# Patient Record
Sex: Male | Born: 1935
Health system: Southern US, Community
[De-identification: ages and names within clinical notes are randomized; demographics above are authoritative.]

## PROBLEM LIST (undated history)

## (undated) DIAGNOSIS — N401 Enlarged prostate with lower urinary tract symptoms: Secondary | ICD-10-CM

## (undated) DIAGNOSIS — F329 Major depressive disorder, single episode, unspecified: Secondary | ICD-10-CM

## (undated) DIAGNOSIS — R609 Edema, unspecified: Secondary | ICD-10-CM

## (undated) DIAGNOSIS — M40209 Unspecified kyphosis, site unspecified: Secondary | ICD-10-CM

## (undated) DIAGNOSIS — E119 Type 2 diabetes mellitus without complications: Secondary | ICD-10-CM

## (undated) DIAGNOSIS — E785 Hyperlipidemia, unspecified: Secondary | ICD-10-CM

## (undated) DIAGNOSIS — C439 Malignant melanoma of skin, unspecified: Secondary | ICD-10-CM

## (undated) DIAGNOSIS — I10 Essential (primary) hypertension: Secondary | ICD-10-CM

## (undated) DIAGNOSIS — E1169 Type 2 diabetes mellitus with other specified complication: Secondary | ICD-10-CM

## (undated) DIAGNOSIS — N138 Other obstructive and reflux uropathy: Secondary | ICD-10-CM

## (undated) DIAGNOSIS — F32A Depression, unspecified: Secondary | ICD-10-CM

## (undated) DIAGNOSIS — J984 Other disorders of lung: Secondary | ICD-10-CM

## (undated) HISTORY — DX: Type 2 diabetes mellitus with other specified complication: E11.69

## (undated) HISTORY — DX: Type 2 diabetes mellitus with other specified complication: E78.5

## (undated) HISTORY — PX: HERNIA REPAIR: SHX51

## (undated) HISTORY — DX: Malignant melanoma of skin, unspecified: C43.9

## (undated) HISTORY — DX: Edema, unspecified: R60.9

## (undated) HISTORY — DX: Benign prostatic hyperplasia with lower urinary tract symptoms: N13.8

## (undated) HISTORY — DX: Unspecified kyphosis, site unspecified: M40.209

## (undated) HISTORY — DX: Other disorders of lung: J98.4

## (undated) HISTORY — PX: MEDIAL PARTIAL KNEE REPLACEMENT: SHX5965

## (undated) HISTORY — DX: Benign prostatic hyperplasia with lower urinary tract symptoms: N40.1

---

## 1997-09-08 ENCOUNTER — Ambulatory Visit (HOSPITAL_COMMUNITY): Admission: RE | Admit: 1997-09-08 | Discharge: 1997-09-08 | Payer: Self-pay | Admitting: *Deleted

## 1997-09-12 ENCOUNTER — Ambulatory Visit: Admission: RE | Admit: 1997-09-12 | Discharge: 1997-09-12 | Payer: Self-pay | Admitting: *Deleted

## 1997-09-27 ENCOUNTER — Ambulatory Visit (HOSPITAL_COMMUNITY): Admission: RE | Admit: 1997-09-27 | Discharge: 1997-09-27 | Payer: Self-pay | Admitting: *Deleted

## 1998-01-17 ENCOUNTER — Ambulatory Visit (HOSPITAL_COMMUNITY): Admission: RE | Admit: 1998-01-17 | Discharge: 1998-01-17 | Payer: Self-pay | Admitting: *Deleted

## 1998-01-17 ENCOUNTER — Encounter: Payer: Self-pay | Admitting: *Deleted

## 1999-01-08 ENCOUNTER — Encounter: Payer: Self-pay | Admitting: Family Medicine

## 1999-01-08 ENCOUNTER — Ambulatory Visit (HOSPITAL_COMMUNITY): Admission: RE | Admit: 1999-01-08 | Discharge: 1999-01-08 | Payer: Self-pay | Admitting: Family Medicine

## 2003-11-14 ENCOUNTER — Encounter (INDEPENDENT_AMBULATORY_CARE_PROVIDER_SITE_OTHER): Payer: Self-pay | Admitting: *Deleted

## 2003-11-14 ENCOUNTER — Ambulatory Visit (HOSPITAL_COMMUNITY): Admission: RE | Admit: 2003-11-14 | Discharge: 2003-11-14 | Payer: Self-pay | Admitting: Gastroenterology

## 2006-09-15 LAB — HM COLONOSCOPY

## 2007-09-23 ENCOUNTER — Emergency Department (HOSPITAL_COMMUNITY): Admission: EM | Admit: 2007-09-23 | Discharge: 2007-09-23 | Payer: Self-pay | Admitting: Emergency Medicine

## 2007-09-23 ENCOUNTER — Emergency Department (HOSPITAL_COMMUNITY): Admission: EM | Admit: 2007-09-23 | Discharge: 2007-09-23 | Payer: Self-pay | Admitting: Family Medicine

## 2007-09-24 ENCOUNTER — Emergency Department (HOSPITAL_COMMUNITY): Admission: EM | Admit: 2007-09-24 | Discharge: 2007-09-24 | Payer: Self-pay | Admitting: Emergency Medicine

## 2008-06-22 DEATH — deceased

## 2010-08-09 NOTE — Op Note (Signed)
NAME:  Brian [name], Brian [name]                          ACCOUNT NO.:  [id number]   MEDICAL RECORD NO.:  [id number]                   PATIENT TYPE:  AMB   LOCATION:  ENDO                                 FACILITY:  [facility]   PHYSICIAN:  James L. Malon Kindle., M.D.          DATE OF BIRTH:  [birth year]   DATE OF PROCEDURE:  11/14/2003  DATE OF DISCHARGE:                                 OPERATIVE REPORT   PROCEDURE:  Colonoscopy and polypectomy.   MEDICATIONS:  Fentanyl 75 mcg, Versed 7 mg IV.   ENDOSCOPE:  Olympus adjustable pediatric colonoscope.   INDICATIONS:  A gentleman who has had previous colon polyps [place]  [place] city.  This is done as a repeat.  He is a bit overdue.   DESCRIPTION OF PROCEDURE:  The procedure had been explained to the patient  and consent obtained.  With the patient in the left lateral decubitus  position, the Olympus scope was inserted and advanced.  The prep was  excellent, and we were able to reach the cecum without difficulty.  The  ileocecal valve and appendiceal orifice were seen.  The scope was withdrawn  and the cecum, ascending colon, transverse colon were seen well and were  free of polyps.  In the descending colon 75-80 cm from the anal verge, a  0.75 semipedunculated polyp was encountered and was removed with a snare,  sucked through the scope.  There was no bleeding.  There were multiple large-  mouthed diverticula, but the colon was really widely patent.  Throughout the  descending and sigmoid colon, no other polyps were seen.  The rectum was  seen well and was free of polyps.  The scope was withdrawn.  The patient  tolerated the procedure well.   ASSESSMENT:  1. Descending colon polyp removed, 211.3.  2. Diverticulosis, 562.10.   PLAN:  Will recommend routine polypectomy instructions.  Will repeat  procedure in five years.                                               James L. Malon Kindle., M.D.    Brian [name]  D:  11/14/2003  T:  11/14/2003   Job:  [id number]   cc:   Meredith Staggers, M.D.  [address]  [place]  [place] [address]  Fax: [phone]   Darnelle Going, M.D.  [facility]  [place], [place]

## 2011-09-01 DIAGNOSIS — G629 Polyneuropathy, unspecified: Secondary | ICD-10-CM | POA: Insufficient documentation

## 2011-09-01 DIAGNOSIS — I10 Essential (primary) hypertension: Secondary | ICD-10-CM | POA: Insufficient documentation

## 2011-09-01 DIAGNOSIS — M40209 Unspecified kyphosis, site unspecified: Secondary | ICD-10-CM | POA: Insufficient documentation

## 2011-09-01 DIAGNOSIS — K635 Polyp of colon: Secondary | ICD-10-CM

## 2011-09-01 DIAGNOSIS — E119 Type 2 diabetes mellitus without complications: Secondary | ICD-10-CM

## 2011-09-01 DIAGNOSIS — M1990 Unspecified osteoarthritis, unspecified site: Secondary | ICD-10-CM | POA: Insufficient documentation

## 2011-09-01 HISTORY — DX: Type 2 diabetes mellitus without complications: E11.9

## 2011-09-01 HISTORY — DX: Essential (primary) hypertension: I10

## 2011-09-01 HISTORY — DX: Polyp of colon: K63.5

## 2012-02-02 DIAGNOSIS — N320 Bladder-neck obstruction: Secondary | ICD-10-CM

## 2012-02-02 HISTORY — DX: Bladder-neck obstruction: N32.0

## 2012-02-17 ENCOUNTER — Other Ambulatory Visit: Payer: Self-pay | Admitting: Gastroenterology

## 2012-11-22 DEATH — deceased

## 2013-03-24 HISTORY — PX: MEDIAL PARTIAL KNEE REPLACEMENT: SHX5965

## 2013-03-24 HISTORY — PX: OTHER SURGICAL HISTORY: SHX169

## 2014-11-10 DIAGNOSIS — E119 Type 2 diabetes mellitus without complications: Secondary | ICD-10-CM | POA: Insufficient documentation

## 2015-02-02 DIAGNOSIS — N138 Other obstructive and reflux uropathy: Secondary | ICD-10-CM | POA: Insufficient documentation

## 2015-02-02 DIAGNOSIS — N401 Enlarged prostate with lower urinary tract symptoms: Secondary | ICD-10-CM | POA: Insufficient documentation

## 2015-05-24 ENCOUNTER — Ambulatory Visit: Payer: Self-pay | Admitting: Physical Therapy

## 2015-06-04 ENCOUNTER — Ambulatory Visit: Payer: Medicare Other | Attending: Internal Medicine | Admitting: Physical Therapy

## 2015-06-04 DIAGNOSIS — M25562 Pain in left knee: Secondary | ICD-10-CM | POA: Insufficient documentation

## 2015-06-04 DIAGNOSIS — R2681 Unsteadiness on feet: Secondary | ICD-10-CM | POA: Diagnosis present

## 2015-06-04 DIAGNOSIS — M25561 Pain in right knee: Secondary | ICD-10-CM | POA: Diagnosis present

## 2015-06-04 DIAGNOSIS — M4806 Spinal stenosis, lumbar region: Secondary | ICD-10-CM | POA: Diagnosis present

## 2015-06-04 DIAGNOSIS — R293 Abnormal posture: Secondary | ICD-10-CM | POA: Diagnosis present

## 2015-06-04 DIAGNOSIS — M2560 Stiffness of unspecified joint, not elsewhere classified: Secondary | ICD-10-CM | POA: Insufficient documentation

## 2015-06-04 DIAGNOSIS — Z7409 Other reduced mobility: Secondary | ICD-10-CM | POA: Insufficient documentation

## 2015-06-04 DIAGNOSIS — M48061 Spinal stenosis, lumbar region without neurogenic claudication: Secondary | ICD-10-CM

## 2015-06-04 NOTE — Patient Instructions (Signed)
   Knee to Chest    Lying supine, bend involved knee to chest __3_ times, hold 30 sec Repeat with other leg. Do __2-3_ times per day.  Copyright  VHI. All rights reserved.     Lumbar Rotation (Non-Weight Bearing)    Feet on floor, slowly rock knees from side to side in small, pain-free range of motion. Allow lower back to rotate slightly. Repeat __10 __ times per set. Do __1-2__ sets per session. Do __2__ sessions per day.  http://orth.exer.us/160   Copyright  VHI. All rights reserved.     Hamstring: Towel Stretch (Supine)    Lie on back. Loop towel around left foot, hip and knee at 50-60 deg. Straighten knee and pull foot toward body. Hold _30 __ seconds. Relax. Repeat __2-3_ times. Do __2_ times a day. Repeat with other leg.    Copyright  VHI. All rights reserved.

## 2015-06-05 NOTE — Therapy (Signed)
[place], [place], [address] Phone: [phone]   Fax:  [phone]  Physical Therapy Evaluation  Patient Details  Name: Brian [name] MRN: [id number] Date of Birth: Jun 24, 1931 Referring Provider: Benjamine Sprague  Encounter Date: 06/04/2015      PT End of Session - 06/04/15 1929    Visit Number 1   Number of Visits 16   Date for PT Re-Evaluation 07/30/15   PT Start Time 1103   PT Stop Time 1146   PT Time Calculation (min) 43 min   Activity Tolerance Patient tolerated treatment well   Behavior During Therapy [facility] for tasks assessed/performed      No past medical history on file.  No past surgical history on file.  There were no vitals filed for this visit.  Visit Diagnosis:  Decreased functional mobility and endurance  Gait instability  Abnormal posture  Spinal stenosis of lumbar region  Joint stiffness of spine  Arthralgia of both lower legs      Subjective Assessment - 06/04/15 1109    Subjective Patient presents with decreased mobility since 02/01/15 when he fell.  He is a recent widower, has been less active, depressed.   He currently complains of bilateral LE symptoms (pain), weakness, denies sensory sx, some mild discomfort in low back and only with standing  He also c/o restricted breathing due to poor posture, becomes short of breath easily.   He has difficulty going from sit to stand without use of UE.  Has used cane and RW.    Patient is accompained by: Family member  Son   Pertinent History DM, HTN, Neuropathy, Unicompartmental knee replacement    Limitations Standing;Walking;House hold activities;Lifting   How long can you sit comfortably? as needed although >2 hours gets uncomfortable in the car    How long can you stand comfortably? <10 min    How long can you walk comfortably? can walk around the block with min pain, usually no more than 15 min    Diagnostic tests XR  [facility]   Patient Stated Goals feel better, get legs stronger   Currently in Pain? Yes   Pain Score 2    Pain Location Leg   Pain Orientation Right;Left;Posterior   Pain Descriptors / Indicators Aching   Pain Type Chronic pain   Pain Onset More than a month ago   Pain Frequency Intermittent   Aggravating Factors  standing, walking   Pain Relieving Factors sitting    Effect of Pain on Daily Activities not as active as he can be, walking limited.    Multiple Pain Sites No            [facility] PT Assessment - 06/04/15 1125    Assessment   Medical Diagnosis osteoarthrits    Referring Provider Benjamine Sprague   Onset Date/Surgical Date --  chronic   Hand Dominance Left   Prior Therapy No    Precautions   Precautions Fall   Restrictions   Weight Bearing Restrictions No   Balance Screen   Has the patient fallen in the past 6 months Yes   How many times? 1  3-4 yrs ago had fallen 3-4 times   Has the patient had a decrease in activity level because of a fear of falling?  Yes   Is the patient reluctant to leave their home because of a fear of falling?  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 shoulder, cervical spine flexed and rotated   AROM   Lumbar Flexion reaches to mid shin with supervision knees bent   Lumbar Extension 75-85%   Lumbar - Right Side Bend 50%   Lumbar - Left Side Bend 50%  pain on L   Lumbar - Right Rotation 75%   Lumbar - Left Rotation 75%   Strength   Right Hip Flexion 4/5   Right Hip Extension 4/5   Right Hip ABduction 4/5   Left Hip Flexion 4/5   Left Hip Extension 4/5   Left Hip ABduction 4/5   Right Knee Flexion 4/5   Right Knee Extension 5/5   Left Knee Flexion 4-/5   Left Knee Extension 5/5   Flexibility   Hamstrings 50 deg bilat, lacks 10-12 deg bilateral post knee    Palpation   Spinal mobility did not tolerate prone for assessment    Palpation comment no pain elicited   Special Tests   Lumbar Tests Straight Leg Raise   Straight Leg Raise   Findings Negative   Side  Left   Comment neg bilat.    Transfers   Transfers Sit to Stand   Sit to Stand 5: Supervision   Five time sit to stand comments  TBA   Comments per MD, patient could only stand x2 unassisted   Ambulation/Gait   Ambulation/Gait Yes   Ambulation/Gait Assistance 6: Modified independent (Device/Increase time)  incr time   Ambulation Distance (Feet) 150 Feet   Assistive device None;Other (Comment)   Gait Pattern Step-to pattern;Decreased step length - right;Decreased step length - left;Decreased hip/knee flexion - right;Decreased hip/knee flexion - left;Shuffle;Lateral trunk lean to right;Lateral trunk lean to left;Decreased trunk rotation;Trunk flexed;Wide base of support   Ambulation Surface Level;Indoor            PT Education - 06/04/15 1928    Education provided Yes   Education Details PT/POC, HEP and referred pain from LS spine   Person(s) Educated Patient   Methods Explanation;Demonstration   Comprehension Verbalized understanding;Returned demonstration;Verbal cues required;Tactile cues required;Need further instruction          PT  Short Term Goals - 06/05/15 0551    PT SHORT TERM GOAL #1   Title Patient will be I with HEP   Time 4   Period Weeks   Status New   PT SHORT TERM GOAL #2   Title Patient will complete balance screen and PT will set goal if needed   Time 2   Period Weeks   Status New   PT SHORT TERM GOAL #3   Title Patient will demo and understand good body mechanics for basic mobility tasks.    Time 4   Period Weeks   Status New  PT Long Term Goals - 06/25/2015 0553    PT LONG TERM GOAL #1   Title Patient will be able to stand for basic home tasks and report only min occasional pain in LEs   Time 8   Period Weeks   Status New   PT LONG TERM GOAL #2   Title Patient will be able to walk for 30 min for endurance and report min pain in LEs, relieved by rest.    Time 8   Period Weeks   Status New   PT LONG TERM GOAL #3   Title Pt will perform sit to stand x 10 without UE assist in a reasonable amt of time to demo improved functional strength   Time 8   Period Weeks   Status New   PT LONG TERM GOAL #4   Title Pt will report being able to feel less dyspnea on exertion with basic ADLs (improved 25% )   Time 8   Period Weeks   Status New   PT LONG TERM GOAL #5   Title Balance goal TBA               Plan - 06/04/15 1931    Clinical Impression Statement This patient presents for mod complexity evaluation for symptoms consistent with spinal stenosis.  Extension increases pain in legs, has back discomfort.  Symptoms are fairly new, he has had a mobility decline since the death of his wife and a fall in which he hit his face, L UE and knees. He would like to maintain his independence and improve his ability to perform daily activities with ease and vigor.    Pt will benefit from skilled therapeutic intervention in order to improve on the following deficits Abnormal gait;Decreased strength;Improper body mechanics;Postural dysfunction;Difficulty walking;Decreased mobility;Impaired  flexibility;Impaired sensation;Decreased range of motion;Decreased balance;Pain;Increased fascial restricitons;Decreased endurance   Rehab Potential Good   PT Frequency 2x / week   PT Duration 8 weeks   PT Treatment/Interventions ADLs/Self Care Home Management;Cryotherapy;Electrical Stimulation;Moist Heat;Ultrasound;Patient/family education;Passive range of motion;Neuromuscular re-education;Balance training;Therapeutic exercise;Therapeutic activities;Manual techniques;Functional mobility training;Gait training   PT Next Visit Plan check HEP for flexion based exercises, begin stabilization and try NuStep and look more closely at gait, assess balance within 2-3 visits. , posture   PT Home Exercise Plan single KTC, LTR and hamstring stretch    Consulted and Agree with Plan of Care Patient          G-Codes - Jun 25, 2015 0559    Functional Assessment Tool Used clinical judgment    Functional Limitation Mobility: Walking and moving around   Mobility: Walking and Moving Around Current Status [phone]) At least 40 percent but less than 60 percent impaired, limited or restricted   Mobility: Walking and Moving Around Goal Status [phone]) At least 20 percent but less than 40 percent impaired, limited or restricted       Problem List There are no active problems to display for this patient.   PAA,JENNIFER 25-Jun-2015, 6:03 AM  [place], [place], [address] Phone: [phone]   Fax:  [phone]  Name: Brian [name] MRN: [id number] Date of Birth: [birth year]  Raeford Razor, PT Jun 25, 2015 6:03 AM Phone: [phone] Fax: [phone]

## 2015-06-14 ENCOUNTER — Ambulatory Visit: Payer: Medicare Other | Admitting: Physical Therapy

## 2015-06-14 DIAGNOSIS — M2560 Stiffness of unspecified joint, not elsewhere classified: Secondary | ICD-10-CM

## 2015-06-14 DIAGNOSIS — Z7409 Other reduced mobility: Secondary | ICD-10-CM

## 2015-06-14 DIAGNOSIS — R293 Abnormal posture: Secondary | ICD-10-CM

## 2015-06-14 DIAGNOSIS — M25561 Pain in right knee: Secondary | ICD-10-CM

## 2015-06-14 DIAGNOSIS — R2681 Unsteadiness on feet: Secondary | ICD-10-CM

## 2015-06-14 DIAGNOSIS — M48061 Spinal stenosis, lumbar region without neurogenic claudication: Secondary | ICD-10-CM

## 2015-06-14 DIAGNOSIS — M25562 Pain in left knee: Secondary | ICD-10-CM

## 2015-06-14 NOTE — Therapy (Signed)
[place], [place], [address] Phone: [phone]   Fax:  [phone]  Physical Therapy Treatment  Patient Details  Name: Brian [name] MRN: [id number] Date of Birth: 11/26/1931 Referring Provider: Benjamine Sprague  Encounter Date: 06/14/2015      PT End of Session - 06/14/15 1346    Visit Number 2   Number of Visits 16   Date for PT Re-Evaluation 07/30/15   PT Start Time 1332   PT Stop Time 1422   PT Time Calculation (min) 50 min   Activity Tolerance Patient tolerated treatment well   Behavior During Therapy [facility] for tasks assessed/performed      No past medical history on file.  No past surgical history on file.  There were no vitals filed for this visit.  Visit Diagnosis:  Decreased functional mobility and endurance  Gait instability  Abnormal posture  Spinal stenosis of lumbar region  Joint stiffness of spine  Arthralgia of both lower legs      Subjective Assessment - 06/14/15 1333    Subjective No new complaints. Pain is about a 2/10 in back.  Has been doing his exercises.    Currently in Pain? Yes   Pain Score 2    Pain Location Leg   Pain Orientation Right;Posterior;Proximal   Pain Descriptors / Indicators Aching   Pain Type Chronic pain   Pain Onset More than a month ago   Pain Frequency Intermittent   Aggravating Factors  standing, walking    Pain Relieving Factors sitting   Multiple Pain Sites No            [facility] PT Assessment - 06/14/15 1412    Transfers   Five time sit to stand comments  x 10,  no UE support, 26.31 sec, Sa O2 down to 93  23.5 inches from floor    Standardized Balance Assessment   Standardized Balance Assessment Timed Up and Go Test   Timed Up and Go Test   Normal TUG (seconds) 12.6  10.7 sec, 9.7 sec             [facility] Adult PT Treatment/Exercise - 06/14/15 1412    Self-Care   Self-Care Posture   Posture anatomy, stenosis with model     Lumbar Exercises: Stretches   Active Hamstring Stretch 2 reps;30 seconds   Active Hamstring Stretch Limitations HEP   Single Knee to Chest Stretch 2 reps;30 seconds   Single Knee to Chest Stretch Limitations HEP   Lower Trunk Rotation 10 seconds   Lower Trunk Rotation Limitations HEP  10 reps arms out   Lumbar Exercises: Supine   Clam 15 reps   Clam Limitations blue band x 10    Bent Knee Raise 10 reps   Bent Knee Raise Limitations blue band for resistance   Bridge 10 reps   Bridge Limitations blue band   painful    Lumbar Exercises: Sidelying   Clam 10 reps   Clam Limitations blue band    Hip Abduction 10 reps   Knee/Hip Exercises: Standing   Hip Abduction Stengthening;Both;1 set;10 reps   Other Standing Knee Exercises standing march slow and hold 5 sec x 10 each               PT Education - 06/14/15 1426    Education provided Yes   Education Details HEp, posture, anatomy   Person(s) Educated Patient   Methods Explanation;Demonstration;Verbal cues   Comprehension Verbalized understanding;Returned demonstration  PT Short Term Goals - 06/14/15 1428    PT SHORT TERM GOAL #1   Title Patient will be I with HEP   Status Achieved   PT SHORT TERM GOAL #2   Title Patient will complete balance screen and PT will set goal if needed   Status On-going   PT SHORT TERM GOAL #3   Title Patient will demo and understand good body mechanics for basic mobility tasks.    Status On-going           PT Long Term Goals - 06/14/15 1428    PT LONG TERM GOAL #1   Title Patient will be able to stand for basic home tasks and report only min occasional pain in LEs   Status On-going   PT LONG TERM GOAL #2   Title Patient will be able to walk for 30 min for endurance and report min pain in LEs, relieved by rest.    Status On-going   PT LONG TERM GOAL #3   Title Pt will perform sit to stand x 10 without UE assist in a reasonable amt of time to demo improved functional  strength   Status On-going   PT LONG TERM GOAL #4   Title Pt will report being able to feel less dyspnea on exertion with basic ADLs (improved 25% )   Status On-going   PT LONG TERM GOAL #5   Title Balance goal TBA- TUG completed but no goal neede (<10 sec)   Status On-going               Plan - 06/14/15 1426    Clinical Impression Statement Working towards goals, reinforced the reason he has leg pain.  Able to do all mat level ex with no pain in back, bridging caused bilateral post leg "pain" vs strain.  TUG score improved each trial.  Can sit to stand easily from 23 inches and no UE support.    PT Next Visit Plan check HEP for flexion based exercises, begin stabilization and try NuStep and look more closely at gait, assess balance within 2-3 visits. , posture   PT Home Exercise Plan single KTC, LTR and hamstring stretch , does hip flex and abd given by MD   Consulted and Agree with Plan of Care Patient        Problem List There are no active problems to display for this patient.   Chenoah Mcnally 06/14/2015, 2:32 PM  [facility] [address] [place], [place], [address] Phone: [phone]   Fax:  [phone]  Name: Brian [name] MRN: [id number] Date of Birth: 1932/02/08   Raeford Razor, PT 06/14/2015 2:33 PM Phone: [phone] Fax: [phone]

## 2015-06-19 ENCOUNTER — Ambulatory Visit: Payer: Medicare Other | Admitting: Physical Therapy

## 2015-06-19 DIAGNOSIS — Z7409 Other reduced mobility: Secondary | ICD-10-CM | POA: Diagnosis not present

## 2015-06-19 DIAGNOSIS — M48061 Spinal stenosis, lumbar region without neurogenic claudication: Secondary | ICD-10-CM

## 2015-06-19 DIAGNOSIS — M2560 Stiffness of unspecified joint, not elsewhere classified: Secondary | ICD-10-CM

## 2015-06-19 DIAGNOSIS — M25561 Pain in right knee: Secondary | ICD-10-CM

## 2015-06-19 DIAGNOSIS — R2681 Unsteadiness on feet: Secondary | ICD-10-CM

## 2015-06-19 DIAGNOSIS — M25562 Pain in left knee: Secondary | ICD-10-CM

## 2015-06-19 DIAGNOSIS — R293 Abnormal posture: Secondary | ICD-10-CM

## 2015-06-19 NOTE — Patient Instructions (Signed)
      HIP: Hamstrings - Short Sitting    Rest leg on raised surface. Keep knee straight. Lift chest. Hold  30 ___ seconds. _3__ reps per set, __1-2_ sets per day, _4-6__ days per week  Copyright  VHI. All rights reserved.  Piriformis Stretch    Lying on back, pull right knee toward opposite shoulder. Hold __30__ seconds. Repeat __2-3__ times. Do __2__ sessions per day.  http://gt2.exer.us/257   Copyright  VHI. All rights reserved.

## 2015-06-19 NOTE — Therapy (Signed)
[place], [place], [address] Phone: [phone]   Fax:  [phone]  Physical Therapy Treatment  Patient Details  Name: Brian [name] MRN: [id number] Date of Birth: 04-Jun-1931 Referring Provider: Benjamine Sprague  Encounter Date: 06/19/2015      PT End of Session - 06/19/15 1202    Visit Number 3   Number of Visits 16   PT Start Time 9323  pt late but also not checked in    PT Stop Time 1245   PT Time Calculation (min) 47 min   Activity Tolerance Patient tolerated treatment well   Behavior During Therapy [facility] for tasks assessed/performed      No past medical history on file.  No past surgical history on file.  There were no vitals filed for this visit.  Visit Diagnosis:  Decreased functional mobility and endurance  Gait instability  Abnormal posture  Spinal stenosis of lumbar region  Joint stiffness of spine  Arthralgia of both lower legs      Subjective Assessment - 06/19/15 1201    Subjective My hip was about a 8/10 yesterday. Today its better, 2/10, mostly in Rt. post thigh.    Currently in Pain? Yes   Pain Score 2    Pain Location Leg   Pain Orientation Right;Proximal;Posterior   Pain Descriptors / Indicators Aching   Pain Type Chronic pain   Pain Onset More than a month ago   Pain Frequency Intermittent   Aggravating Factors  stand, walk   Pain Relieving Factors sitting , rest                         [facility] Adult PT Treatment/Exercise - 06/19/15 1203    Lumbar Exercises: Stretches   Active Hamstring Stretch 2 reps;30 seconds   Active Hamstring Stretch Limitations modified to seated    Single Knee to Chest Stretch 2 reps;30 seconds   Single Knee to Chest Stretch Limitations --   Lower Trunk Rotation 3 reps;10 seconds   Lower Trunk Rotation Limitations with legs crossed for more hip stretch    Piriformis Stretch 2 reps;30 seconds   Piriformis Stretch  Limitations HEP    Lumbar Exercises: Standing   Wall Slides 10 reps   Other Standing Lumbar Exercises used wall for postural strengthening    Other Standing Lumbar Exercises calf stretch 30sec x 3   Moist Heat Therapy   Number Minutes Moist Heat 12 Minutes   Moist Heat Location Hip   Manual Therapy   Manual Therapy Soft tissue mobilization   Soft tissue mobilization Rt. gluteals, biceps femoris and hamstring lateral, sore but not painful with deep palpation                PT Education - 06/19/15 1243    Education provided Yes   Education Details stenosis, HEP    Person(s) Educated Patient   Methods Explanation;Handout   Comprehension Verbalized understanding;Returned demonstration          PT Short Term Goals - 06/14/15 1428    PT SHORT TERM GOAL #1   Title Patient will be I with HEP   Status Achieved   PT SHORT TERM GOAL #2   Title Patient will complete balance screen and PT will set goal if needed   Status On-going   PT SHORT TERM GOAL #3   Title Patient will demo and understand good body mechanics for basic mobility tasks.  Status On-going           PT Long Term Goals - 06/14/15 1428    PT LONG TERM GOAL #1   Title Patient will be able to stand for basic home tasks and report only min occasional pain in LEs   Status On-going   PT LONG TERM GOAL #2   Title Patient will be able to walk for 30 min for endurance and report min pain in LEs, relieved by rest.    Status On-going   PT LONG TERM GOAL #3   Title Pt will perform sit to stand x 10 without UE assist in a reasonable amt of time to demo improved functional strength   Status On-going   PT LONG TERM GOAL #4   Title Pt will report being able to feel less dyspnea on exertion with basic ADLs (improved 25% )   Status On-going   PT LONG TERM GOAL #5   Title Balance goal TBA- TUG completed but no goal neede (<10 sec)   Status On-going               Plan - 06/19/15 1252    Clinical Impression  Statement Patient tolerates session well, addressed Rt. leg pain with soft tissue work but not as painful as expected.  No goals met, modified HEP for more ease.    PT Next Visit Plan check HEP for flexion based exercises, begin stabilization and try NuStep and look more closely at gait, assess balance within 2-3 visits. , posture   PT Home Exercise Plan single KTC, LTR and hamstring stretch , does hip flex and abd given by MD, piriformis   Consulted and Agree with Plan of Care Patient        Problem List There are no active problems to display for this patient.   Brian [name] 06/19/2015, 12:55 PM  [facility] [address] [place], [place], [address] Phone: [phone]   Fax:  [phone]  Name: Brian [name] MRN: [id number] Date of Birth: September 29, 1931    Raeford Razor, PT 06/19/2015 12:55 PM Phone: [phone] Fax: [phone]

## 2015-06-21 ENCOUNTER — Ambulatory Visit: Payer: Medicare Other | Admitting: Physical Therapy

## 2015-06-21 DIAGNOSIS — M2560 Stiffness of unspecified joint, not elsewhere classified: Secondary | ICD-10-CM

## 2015-06-21 DIAGNOSIS — R293 Abnormal posture: Secondary | ICD-10-CM

## 2015-06-21 DIAGNOSIS — Z7409 Other reduced mobility: Secondary | ICD-10-CM | POA: Diagnosis not present

## 2015-06-21 DIAGNOSIS — R2681 Unsteadiness on feet: Secondary | ICD-10-CM

## 2015-06-21 DIAGNOSIS — M48061 Spinal stenosis, lumbar region without neurogenic claudication: Secondary | ICD-10-CM

## 2015-06-21 DIAGNOSIS — M25561 Pain in right knee: Secondary | ICD-10-CM

## 2015-06-21 DIAGNOSIS — M25562 Pain in left knee: Secondary | ICD-10-CM

## 2015-06-21 NOTE — Therapy (Signed)
[place], [place], [address] Phone: [phone]   Fax:  [phone]  Physical Therapy Treatment  Patient Details  Name: Brian [name] MRN: [id number] Date of Birth: November 25, 1931 Referring Provider: Benjamine Sprague  Encounter Date: 06/21/2015      PT End of Session - 06/21/15 1357    Visit Number 4   Number of Visits 16   Date for PT Re-Evaluation 07/30/15   PT Start Time [id number]   PT Stop Time 1433   PT Time Calculation (min) 58 min   Equipment Utilized During Treatment Gait belt   Activity Tolerance Patient tolerated treatment well   Behavior During Therapy [facility] for tasks assessed/performed      No past medical history on file.  No past surgical history on file.  Filed Vitals:   06/21/15 1430  SpO2: 97%    Visit Diagnosis:  Decreased functional mobility and endurance  Gait instability  Spinal stenosis of lumbar region  Abnormal posture  Joint stiffness of spine  Arthralgia of both lower legs      Subjective Assessment - 06/21/15 1336    Subjective Virtually pain free yestrday, had a little pain this morning.    Currently in Pain? No/denies            [facility] PT Assessment - 06/21/15 0001    Berg Balance Test   Sit to Stand Able to stand without using hands and stabilize independently   Standing Unsupported Able to stand safely 2 minutes   Sitting with Back Unsupported but Feet Supported on Floor or Stool Able to sit safely and securely 2 minutes   Stand to Sit Sits safely with minimal use of hands   Transfers Able to transfer safely, minor use of hands   Standing Unsupported with Eyes Closed Able to stand 10 seconds with supervision   Standing Ubsupported with Feet Together Able to place feet together independently and stand for 1 minute with supervision   From Standing, Reach Forward with Outstretched Arm Can reach forward >5 cm safely (2")   From Standing Position, Pick up  Object from Floor Able to pick up shoe, needs supervision   From Standing Position, Turn to Look Behind Over each Shoulder Looks behind one side only/other side shows less weight shift   Turn 360 Degrees Able to turn 360 degrees safely one side only in 4 seconds or less   Standing Unsupported, Alternately Place Feet on Step/Stool Able to complete 4 steps without aid or supervision   Standing Unsupported, One Foot in Front Needs help to step but can hold 15 seconds   Standing on One Leg Tries to lift leg/unable to hold 3 seconds but remains standing independently   Total Score 41   Berg comment: 41/56            [facility] Adult PT Treatment/Exercise - 06/21/15 1401    Lumbar Exercises: Supine   Bridge 10 reps   Straight Leg Raise 10 reps   Other Supine Lumbar Exercises hip abd x 10 supine    Lumbar Exercises: Sidelying   Hip Abduction 10 reps   Knee/Hip Exercises: Seated   Long Arc Quad Strengthening;Both;1 set;20 reps   Long Arc Quad Limitations ball squeeze   Moist Heat Therapy   Number Minutes Moist Heat 10 Minutes   Moist Heat Location Hip  prone with pillows   Manual Therapy   Soft tissue mobilization compression and PROM to bilateral hips ER/IR, compression to glutes,  piriformis and prox hamstring                PT Education - 06/21/15 1423    Education provided Yes   Education Details balance and Community education officer) Educated Patient   Methods Explanation   Comprehension Verbalized understanding          PT Short Term Goals - 06/14/15 1428    PT SHORT TERM GOAL #1   Title Patient will be I with HEP   Status Achieved   PT SHORT TERM GOAL #2   Title Patient will complete balance screen and PT will set goal if needed   Status On-going   PT SHORT TERM GOAL #3   Title Patient will demo and understand good body mechanics for basic mobility tasks.    Status On-going           PT Long Term Goals - 06/21/15 1428    PT LONG TERM GOAL #1   Title Patient  will be able to stand for basic home tasks and report only min occasional pain in LEs   Status On-going   PT LONG TERM GOAL #2   Title Patient will be able to walk for 30 min for endurance and report min pain in LEs, relieved by rest.    Status On-going   PT LONG TERM GOAL #3   Title Pt will perform sit to stand x 10 without UE assist in a reasonable amt of time to demo improved functional strength   Status On-going   PT LONG TERM GOAL #4   Title Pt will report being able to feel less dyspnea on exertion with basic ADLs (improved 25% )   Status On-going   PT LONG TERM GOAL #5   Title Berg score will improve to 47/56 to demo decreased fall risk   Time 8   Period Weeks   Status On-going               Plan - 06/21/15 1426    Clinical Impression Statement Patient with favorable response to manual work yesterday. Berg score 41/56 indicating cane for community mobility and in the home.  He was agreeable to use.     PT Next Visit Plan check HEP for flexion based exercises, begin stabilization and try NuStep  posture, manual and hamstring stretching   PT Home Exercise Plan single KTC, LTR and hamstring stretch , does hip flex and abd given by MD, piriformis   Consulted and Agree with Plan of Care Patient        Problem List There are no active problems to display for this patient.   PAA,JENNIFER 06/21/2015, 2:30 PM  [facility] [address] [place], [place], [address] Phone: [phone]   Fax:  [phone]  Name: Brian [name] MRN: [id number] Date of Birth: 1931-08-16  Raeford Razor, PT 06/21/2015 2:35 PM Phone: [phone] Fax: [phone]

## 2015-06-26 ENCOUNTER — Ambulatory Visit: Payer: Medicare Other | Attending: Internal Medicine | Admitting: Physical Therapy

## 2015-06-26 DIAGNOSIS — R2689 Other abnormalities of gait and mobility: Secondary | ICD-10-CM | POA: Diagnosis present

## 2015-06-26 DIAGNOSIS — M79604 Pain in right leg: Secondary | ICD-10-CM

## 2015-06-26 DIAGNOSIS — R29898 Other symptoms and signs involving the musculoskeletal system: Secondary | ICD-10-CM | POA: Diagnosis present

## 2015-06-26 DIAGNOSIS — M79605 Pain in left leg: Secondary | ICD-10-CM | POA: Diagnosis not present

## 2015-06-26 DIAGNOSIS — R293 Abnormal posture: Secondary | ICD-10-CM

## 2015-06-26 NOTE — Therapy (Signed)
[place], [place], [address] Phone: [phone]   Fax:  [phone]  Physical Therapy Treatment  Patient Details  Name: Brian [name] MRN: [id number] Date of Birth: 07-29-1931 Referring Provider: Benjamine Sprague  Encounter Date: 06/26/2015      PT End of Session - 06/26/15 1842    Visit Number 5   Number of Visits 16   Date for PT Re-Evaluation 07/30/15   PT Start Time 1330   PT Stop Time 1430   PT Time Calculation (min) 60 min   Activity Tolerance Patient tolerated treatment well   Behavior During Therapy [facility] for tasks assessed/performed      No past medical history on file.  No past surgical history on file.  Filed Vitals:   06/26/15 1845  SpO2: 96%    Visit Diagnosis:  Pain In Left Leg  Abnormal posture  Other abnormalities of gait and mobility  Other symptoms and signs involving the musculoskeletal system  Pain In Right Leg      Subjective Assessment - 06/26/15 1338    Subjective My pain was a 9/10 Sunday, heat helps and exercises dont make it any worse.    Currently in Pain? Yes   Pain Score 4    Pain Location Leg   Pain Orientation Right;Left;Proximal;Posterior   Pain Descriptors / Indicators Aching   Pain Type Chronic pain   Pain Onset More than a month ago   Pain Frequency Intermittent   Aggravating Factors  stand, walk    Pain Relieving Factors rest, sit, heat   Multiple Pain Sites No                         [facility] Adult PT Treatment/Exercise - 06/26/15 1342    Lumbar Exercises: Stretches   Active Hamstring Stretch 2 reps;30 seconds   Active Hamstring Stretch Limitations modified to seated    Single Knee to Chest Stretch 2 reps;30 seconds   Lower Trunk Rotation 3 reps;10 seconds   Lower Trunk Rotation Limitations with legs crossed for more hip stretch    Piriformis Stretch 2 reps;30 seconds   Piriformis Stretch Limitations HEP    Lumbar Exercises:  Aerobic   Stationary Bike NuStep L6 UE and LE for endurance, 5 min    Lumbar Exercises: Supine   Bridge 10 reps   Straight Leg Raise 10 reps   Knee/Hip Exercises: Seated   Marching Limitations x 10    Sit to Sand 10 reps;without UE support  20 inch    Moist Heat Therapy   Number Minutes Moist Heat 10 Minutes   Moist Heat Location Hip   Manual Therapy   Soft tissue mobilization compression and PROM to bilateral hips ER/IR, compression to glutes, piriformis and prox hamstring                  PT Short Term Goals - 06/26/15 1401    PT SHORT TERM GOAL #1   Title Patient will be I with HEP   Status Achieved   PT SHORT TERM GOAL #2   Title Patient will complete balance screen and PT will set goal if needed   Status Achieved   PT SHORT TERM GOAL #3   Title Patient will demo and understand good body mechanics for basic mobility tasks.    Status On-going           PT Long Term Goals - 06/26/15 1401  PT LONG TERM GOAL #1   Title Patient will be able to stand for basic home tasks and report only min occasional pain in LEs   Status On-going   PT LONG TERM GOAL #2   Title Patient will be able to walk for 30 min for endurance and report min pain in LEs, relieved by rest.    Status On-going   PT LONG TERM GOAL #3   Title Pt will perform sit to stand x 10 without UE assist in a reasonable amt of time to demo improved functional strength   Baseline 20 inches height    Status Achieved   PT LONG TERM GOAL #4   Title Pt will report being able to feel less dyspnea on exertion with basic ADLs (improved 25% )   Status On-going   PT LONG TERM GOAL #5   Title Berg score will improve to 47/56 to demo decreased fall risk   Status On-going               Plan - 06/26/15 1842    Clinical Impression Statement Patient admitting to lifting a mod sized item from the ground Sat, used good mechanics and moved slowly, but this may have exacerbated his pain Sunday.  Lumbar pain is  a new symptom.  He was able to do all ex today without increase in pain. WAs able to meet sit to stand goal, sit to stand x10 within reasonable amt of time, albeit with wheexing and dyspnea.  He will cont to benefit from cont PT to address pain in legs, history of alls and abnormal posture.     PT Next Visit Plan  flexion based exercises, begin stabilization and try NuStep  posture, manual and hamstring stretching   PT Home Exercise Plan single KTC, LTR and hamstring stretch , does hip flex and abd given by MD, piriformis   Consulted and Agree with Plan of Care Patient        Problem List There are no active problems to display for this patient.   PAA,JENNIFER 06/26/2015, 6:48 PM  [facility] [address] [place], [place], [address] Phone: [phone]   Fax:  [phone]  Name: ETHER PROVENCIO MRN: [id number] Date of Birth: [birth year]   Raeford Razor, PT 06/26/2015 6:48 PM Phone: [phone] Fax: [phone]

## 2015-07-04 ENCOUNTER — Ambulatory Visit: Payer: Medicare Other | Admitting: Physical Therapy

## 2015-07-04 DIAGNOSIS — R293 Abnormal posture: Secondary | ICD-10-CM

## 2015-07-04 DIAGNOSIS — M79605 Pain in left leg: Secondary | ICD-10-CM | POA: Diagnosis not present

## 2015-07-04 DIAGNOSIS — R29898 Other symptoms and signs involving the musculoskeletal system: Secondary | ICD-10-CM

## 2015-07-04 DIAGNOSIS — R2689 Other abnormalities of gait and mobility: Secondary | ICD-10-CM

## 2015-07-04 DIAGNOSIS — M79604 Pain in right leg: Secondary | ICD-10-CM

## 2015-07-04 NOTE — Therapy (Signed)
[place], [place], [address] Phone: [phone]   Fax:  [phone]  Physical Therapy Treatment  Patient Details  Name: Brian [name] MRN: [id number] Date of Birth: 05/18/1931 Referring Provider: Benjamine Sprague  Encounter Date: 07/04/2015      PT End of Session - 07/04/15 1155    Visit Number 6   Number of Visits 16   Date for PT Re-Evaluation 07/30/15   PT Start Time 1150   PT Stop Time 1240   PT Time Calculation (min) 50 min   Activity Tolerance Patient tolerated treatment well   Behavior During Therapy [facility] for tasks assessed/performed      No past medical history on file.  No past surgical history on file.  There were no vitals filed for this visit.      Subjective Assessment - 07/04/15 1152    Subjective I have about a 7/10 when I stand still.  Fell at [employer] as I stepping in a hole yesteday.  Elbow swollen and on antibiotics.  Sees MD Monday.      No pain at rest, sitting.       [facility] Adult PT Treatment/Exercise - 07/04/15 1156    Lumbar Exercises: Stretches   Single Knee to Chest Stretch 2 reps;30 seconds   Lower Trunk Rotation 5 reps;10 seconds   Lower Trunk Rotation Limitations 5 reps legs crossed    Piriformis Stretch 2 reps;30 seconds   Piriformis Stretch Limitations HEP    Lumbar Exercises: Aerobic   Stationary Bike NuStep L6 UE and LE for endurance, 5 min    Lumbar Exercises: Supine   Other Supine Lumbar Exercises decompression: leg lengthener 5 sec x 10 , isometric hip ext x 5 x 10    Lumbar Exercises: Sidelying   Clam 10 reps   Hip Abduction 10 reps   Other Sidelying Lumbar Exercises Internal rot. hip x 10 each    Moist Heat Therapy   Number Minutes Moist Heat 10 Minutes   Moist Heat Location Hip                  PT Short Term Goals - 06/26/15 1401    PT SHORT TERM GOAL #1   Title Patient will be I with HEP   Status Achieved   PT SHORT TERM GOAL  #2   Title Patient will complete balance screen and PT will set goal if needed   Status Achieved   PT SHORT TERM GOAL #3   Title Patient will demo and understand good body mechanics for basic mobility tasks.    Status On-going           PT Long Term Goals - 06/26/15 1401    PT LONG TERM GOAL #1   Title Patient will be able to stand for basic home tasks and report only min occasional pain in LEs   Status On-going   PT LONG TERM GOAL #2   Title Patient will be able to walk for 30 min for endurance and report min pain in LEs, relieved by rest.    Status On-going   PT LONG TERM GOAL #3   Title Pt will perform sit to stand x 10 without UE assist in a reasonable amt of time to demo improved functional strength   Baseline 20 inches height    Status Achieved   PT LONG TERM GOAL #4   Title Pt will report being able to feel less dyspnea on exertion  with basic ADLs (improved 25% )   Status On-going   PT LONG TERM GOAL #5   Title Berg score will improve to 47/56 to demo decreased fall risk   Status On-going               Plan - 07/04/15 1226    Clinical Impression Statement Patient with recent fall on uneven terrain, no injuries but was seen at Urgent Care.  He cont to be limited in his ability to stand > 10 min.  Has started using cane and that helps him to stand up to 30 min.  Can walk with less pain.  Can sit to stand without using UEs from a typical chair height. Endurance improving.     Rehab Potential Good   PT Frequency 2x / week   PT Next Visit Plan  flexion based exercises, begin stabilization and try NuStep  posture, manual and hamstring stretching   PT Home Exercise Plan single KTC, LTR and hamstring stretch , does hip flex and abd given by MD, piriformis   Consulted and Agree with Plan of Care Patient     No pain after session .     Patient will benefit from skilled therapeutic intervention in order to improve the following deficits and impairments:  Abnormal gait,  Decreased strength, Improper body mechanics, Postural dysfunction, Difficulty walking, Decreased mobility, Impaired flexibility, Impaired sensation, Decreased range of motion, Decreased balance, Pain, Increased fascial restricitons, Decreased endurance  Visit Diagnosis: Abnormal posture  Other abnormalities of gait and mobility  Other symptoms and signs involving the musculoskeletal system  Pain In Left Leg  Pain In Right Leg     Problem List There are no active problems to display for this patient.   Ciria Bernardini 07/04/2015, 12:34 PM  [facility] [address] [place], [place], [address] Phone: [phone]   Fax:  [phone]  Name: Brian [name] MRN: [id number] Date of Birth: [birth year]    Raeford Razor, PT 07/04/2015 12:35 PM Phone: [phone] Fax: [phone]

## 2015-07-10 ENCOUNTER — Ambulatory Visit: Payer: Medicare Other | Admitting: Physical Therapy

## 2015-07-10 DIAGNOSIS — M79605 Pain in left leg: Secondary | ICD-10-CM

## 2015-07-10 DIAGNOSIS — R293 Abnormal posture: Secondary | ICD-10-CM

## 2015-07-10 DIAGNOSIS — R29898 Other symptoms and signs involving the musculoskeletal system: Secondary | ICD-10-CM

## 2015-07-10 DIAGNOSIS — R2689 Other abnormalities of gait and mobility: Secondary | ICD-10-CM

## 2015-07-10 DIAGNOSIS — M79604 Pain in right leg: Secondary | ICD-10-CM

## 2015-07-10 NOTE — Therapy (Signed)
[place], [place], [address] Phone: [phone]   Fax:  [phone]  Physical Therapy Treatment  Patient Details  Name: Brian [name] MRN: [id number] Date of Birth: May 14, 1931 Referring Provider: Benjamine Sprague  Encounter Date: 07/10/2015      PT End of Session - 07/10/15 1237    Visit Number 7   Number of Visits 16   Date for PT Re-Evaluation 07/30/15   PT Start Time 1150   PT Stop Time 1243   PT Time Calculation (min) 53 min   Activity Tolerance Patient tolerated treatment well   Behavior During Therapy [facility] for tasks assessed/performed      No past medical history on file.  No past surgical history on file.  There were no vitals filed for this visit.      Subjective Assessment - 07/10/15 1207    Subjective I'm about a 7/10.  Dr. drained elbow. Was happy with his function. Able to sit to stand x 8 from his chair in the office.                          [facility] Adult PT Treatment/Exercise - 07/10/15 1209    Lumbar Exercises: Stretches   Active Hamstring Stretch 2 reps;30 seconds   Lower Trunk Rotation 5 reps   Lower Trunk Rotation Limitations seated on foam   Pelvic Tilt 5 reps   Piriformis Stretch 2 reps;30 seconds   Lumbar Exercises: Aerobic   Stationary Bike NuStep L6 UE and LE for endurance, 5 min    Lumbar Exercises: Standing   Other Standing Lumbar Exercises seated row and horiz abd red band x 10 each for posture    Lumbar Exercises: Supine   Bridge 10 reps   Knee/Hip Exercises: Seated   Marching Limitations x 10    Sit to Sand 2 sets;10 reps;without UE support  on foam board   Moist Heat Therapy   Number Minutes Moist Heat 10 Minutes   Moist Heat Location Lumbar Spine;Hip                PT Education - 07/10/15 1237    Education provided No          PT Short Term Goals - 07/10/15 1241    PT SHORT TERM GOAL #1   Title Patient will be I with HEP    Status On-going   PT SHORT TERM GOAL #2   Title Patient will complete balance screen and PT will set goal if needed   Status Achieved   PT SHORT TERM GOAL #3   Title Patient will demo and understand good body mechanics for basic mobility tasks.    Status On-going           PT Long Term Goals - 07/10/15 1241    PT LONG TERM GOAL #1   Title Patient will be able to stand for basic home tasks and report only min occasional pain in LEs   Status On-going   PT LONG TERM GOAL #2   Title Patient will be able to walk for 30 min for endurance and report min pain in LEs, relieved by rest.    Status On-going   PT LONG TERM GOAL #3   Title Pt will perform sit to stand x 10 without UE assist in a reasonable amt of time to demo improved functional strength   Status Achieved   PT LONG TERM GOAL #4  Title Pt will report being able to feel less dyspnea on exertion with basic ADLs (improved 25% )   Status On-going   PT LONG TERM GOAL #5   Title Berg score will improve to 47/56 to demo decreased fall risk   Status Unable to assess               Plan - 07/10/15 1238    Clinical Impression Statement Working towards goals, able to add in upper body exercises to address overall posture.  Responds well to stretching and modalities.     PT Next Visit Plan FOTO and GCODE within 1-2  flexion based exercises, begin stabilization and try NuStep  posture, manual and hamstring stretching   PT Home Exercise Plan single KTC, LTR and hamstring stretch , does hip flex and abd given by MD, piriformis   Consulted and Agree with Plan of Care Patient      Patient will benefit from skilled therapeutic intervention in order to improve the following deficits and impairments:  Abnormal gait, Decreased strength, Improper body mechanics, Postural dysfunction, Difficulty walking, Decreased mobility, Impaired flexibility, Impaired sensation, Decreased range of motion, Decreased balance, Pain, Increased fascial  restricitons, Decreased endurance  Visit Diagnosis: Abnormal posture  Other abnormalities of gait and mobility  Other symptoms and signs involving the musculoskeletal system  Pain In Left Leg  Pain In Right Leg     Problem List There are no active problems to display for this patient.   Arman Loy 07/10/2015, 12:44 PM  [facility] [address] [place], [place], [address] Phone: [phone]   Fax:  [phone]  Name: Brian [name] MRN: [id number] Date of Birth: Mar 26, 1931    Raeford Razor, PT 07/10/2015 12:53 PM Phone: [phone] Fax: [phone]

## 2015-07-12 ENCOUNTER — Ambulatory Visit: Payer: Medicare Other | Admitting: Physical Therapy

## 2015-07-12 DIAGNOSIS — R29898 Other symptoms and signs involving the musculoskeletal system: Secondary | ICD-10-CM

## 2015-07-12 DIAGNOSIS — M79604 Pain in right leg: Secondary | ICD-10-CM

## 2015-07-12 DIAGNOSIS — M79605 Pain in left leg: Secondary | ICD-10-CM | POA: Diagnosis not present

## 2015-07-12 DIAGNOSIS — R2689 Other abnormalities of gait and mobility: Secondary | ICD-10-CM

## 2015-07-12 DIAGNOSIS — R293 Abnormal posture: Secondary | ICD-10-CM

## 2015-07-12 NOTE — Therapy (Signed)
[place], [place], [address] Phone: [phone]   Fax:  [phone]  Physical Therapy Treatment  Patient Details  Name: Brian [name] MRN: [id number] Date of Birth: [birth year] Referring Provider: Benjamine Sprague  Encounter Date: 07/12/2015      PT End of Session - 07/12/15 1110    Visit Number 8   Number of Visits 16   Date for PT Re-Evaluation 07/30/15   PT Start Time 1103   PT Stop Time 1147   PT Time Calculation (min) 44 min   Equipment Utilized During Treatment Gait belt   Activity Tolerance Patient tolerated treatment well   Behavior During Therapy [facility] for tasks assessed/performed      No past medical history on file.  No past surgical history on file.  There were no vitals filed for this visit.      Subjective Assessment - 07/12/15 1107    Subjective Back's not bothering me today.    Currently in Pain? No/denies           [facility] Adult PT Treatment/Exercise - 07/12/15 1113    Therapeutic Activites    Therapeutic Activities Other Therapeutic Activities   Other Therapeutic Activities Parallel bars: sidestepping, high march, toe taps, step ups x 30 each leg (8 inch step)_and tandem, retrogait  cues needed for safety, use of mirror for posture, LE align   Lumbar Exercises: Stretches   Single Knee to Chest Stretch 2 reps;30 seconds   Lower Trunk Rotation Other (comment)   Lower Trunk Rotation Limitations x 10 with overpressure    Lumbar Exercises: Aerobic   Stationary Bike L5 , 8 min UE and LE    Manual Therapy   Manual Therapy Passive ROM   Passive ROM hamstring stretching and piriformis stretching each LE x 30 sec x 3 each             PT Short Term Goals - 07/10/15 1241    PT SHORT TERM GOAL #1   Title Patient will be I with HEP   Status On-going   PT SHORT TERM GOAL #2   Title Patient will complete balance screen and PT will set goal if needed   Status Achieved   PT  SHORT TERM GOAL #3   Title Patient will demo and understand good body mechanics for basic mobility tasks.    Status On-going           PT Long Term Goals - 07/10/15 1241    PT LONG TERM GOAL #1   Title Patient will be able to stand for basic home tasks and report only min occasional pain in LEs   Status On-going   PT LONG TERM GOAL #2   Title Patient will be able to walk for 30 min for endurance and report min pain in LEs, relieved by rest.    Status On-going   PT LONG TERM GOAL #3   Title Pt will perform sit to stand x 10 without UE assist in a reasonable amt of time to demo improved functional strength   Status Achieved   PT LONG TERM GOAL #4   Title Pt will report being able to feel less dyspnea on exertion with basic ADLs (improved 25% )   Status On-going   PT LONG TERM GOAL #5   Title Berg score will improve to 47/56 to demo decreased fall risk   Status Unable to assess  Plan - 07/12/15 1159    Clinical Impression Statement Pt able to stand for up to 10 min at a time for ther activity, addressing balance, endurance and gait, several rest breaks needed for recovery. No pain today and reports none post session.     PT Next Visit Plan FOTO and [place] within 1-2 visits,  flexion based exercises, begin stabilization and try NuStep  posture, manual and hamstring stretching   PT Home Exercise Plan single KTC, LTR and hamstring stretch , does hip flex and abd given by MD, piriformis   Consulted and Agree with Plan of Care Patient      Patient will benefit from skilled therapeutic intervention in order to improve the following deficits and impairments:  Abnormal gait, Decreased strength, Improper body mechanics, Postural dysfunction, Difficulty walking, Decreased mobility, Impaired flexibility, Impaired sensation, Decreased range of motion, Decreased balance, Pain, Increased fascial restricitons, Decreased endurance  Visit Diagnosis: Abnormal posture  Other  abnormalities of gait and mobility  Other symptoms and signs involving the musculoskeletal system  Pain In Left Leg  Pain In Right Leg     Problem List There are no active problems to display for this patient.   Joon Pohle 07/12/2015, 12:04 PM  [place], [place], [address] Phone: [phone]   Fax:  [phone]  Name: Brian [name] MRN: [id number] Date of Birth: 1931-12-04    Raeford Razor, PT 07/12/2015 12:05 PM Phone: [phone] Fax: [phone]

## 2015-07-23 ENCOUNTER — Ambulatory Visit: Payer: Medicare Other | Admitting: Physical Therapy

## 2015-07-25 ENCOUNTER — Encounter: Payer: Medicare Other | Admitting: Physical Therapy

## 2015-07-30 ENCOUNTER — Ambulatory Visit: Payer: Medicare Other | Attending: Internal Medicine | Admitting: Physical Therapy

## 2015-07-30 DIAGNOSIS — R293 Abnormal posture: Secondary | ICD-10-CM | POA: Insufficient documentation

## 2015-07-30 DIAGNOSIS — R2689 Other abnormalities of gait and mobility: Secondary | ICD-10-CM

## 2015-07-30 DIAGNOSIS — M79604 Pain in right leg: Secondary | ICD-10-CM | POA: Diagnosis present

## 2015-07-30 DIAGNOSIS — R29898 Other symptoms and signs involving the musculoskeletal system: Secondary | ICD-10-CM | POA: Insufficient documentation

## 2015-07-30 DIAGNOSIS — M79605 Pain in left leg: Secondary | ICD-10-CM

## 2015-07-30 NOTE — Therapy (Signed)
[facility] [address] [place], [place], [address] Phone: [phone]   Fax:  [phone]  Physical Therapy Treatment/Renewal  Patient Details  Name: Brian [name] MRN: [id number] Date of Birth: 04/29/1931 Referring Provider: Francis O'Brien  Encounter Date: 07/30/2015      PT End of Session - 07/30/15 1211    Visit Number 9   Number of Visits 25   Date for PT Re-Evaluation 09/24/15   PT Start Time 1146   PT Stop Time 1245   PT Time Calculation (min) 59 min   Activity Tolerance Patient tolerated treatment well   Behavior During Therapy WFL for tasks assessed/performed      No past medical history on file.  No past surgical history on file.  There were no vitals filed for this visit.      Subjective Assessment - 07/30/15 1203    Subjective Had to have oral surgery.  Missed some visits.  Has not been very active. Also had to see MD for elbow. May need orthopedic surgery.    Currently in Pain? No/denies   Pain Score --  not rated, with walking rates moderate    Pain Location Leg   Pain Orientation Right;Left;Proximal;Posterior   Pain Descriptors / Indicators Aching   Pain Type Chronic pain   Pain Onset More than a month ago   Pain Frequency Intermittent   Aggravating Factors  stand, walk   Pain Relieving Factors rest, sit  and heat             [facility] PT Assessment - 07/30/15 1221    Strength   Right Hip ABduction 4+/5   Left Hip ABduction 4/5   Ambulation/Gait   Ambulation/Gait Yes   Ambulation/Gait Assistance 4: Min guard  incr time   Ambulation Distance (Feet) 150 Feet   Assistive device None;Other (Comment)   Gait Pattern Step-to pattern;Decreased step length - right;Decreased step length - left;Decreased hip/knee flexion - right;Decreased hip/knee flexion - left;Shuffle;Lateral trunk lean to right;Lateral trunk lean to left;Decreased trunk rotation;Trunk flexed;Wide base of support   Gait Comments  after NuStep, patient was unsteady with min LOB                      [facility] Adult PT Treatment/Exercise - 07/30/15 1221    Lumbar Exercises: Aerobic   Stationary Bike 8 min Nustep L6 UE and LE                 PT Education - 07/30/15 1223    Education provided Yes   Education Details renewal, stenosis    Person(s) Educated Patient   Methods Explanation;Demonstration   Comprehension Verbalized understanding;Returned demonstration          PT Short Term Goals - 07/30/15 1212    PT SHORT TERM GOAL #1   Title Patient will be I with HEP   Status On-going   PT SHORT TERM GOAL #2   Title Patient will complete balance screen and PT will set goal if needed   Status Achieved   PT SHORT TERM GOAL #3   Title Patient will demo and understand good body mechanics for basic mobility tasks.    Status Partially Met           PT Long Term Goals - 07/30/15 1212    PT LONG TERM GOAL #1   Title Patient will be able to stand for basic home tasks and report only min occasional pain in LEs     Baseline can stand for 15-20 min but pain mod    Status Partially Met   PT LONG TERM GOAL #2   Title Patient will be able to walk for 30 min for endurance and report min pain in LEs, relieved by rest.    Baseline mod    Status Partially Met   PT LONG TERM GOAL #3   Title Pt will perform sit to stand x 10 without UE assist in a reasonable amt of time to demo improved functional strength   Status Achieved   PT LONG TERM GOAL #4   Title Pt will report being able to feel less dyspnea on exertion with basic ADLs (improved 25% )   Status On-going   PT LONG TERM GOAL #5   Title Berg score will improve to 47/56 to demo decreased fall risk   Status Unable to assess               Plan - 07/30/15 1242    Clinical Impression Statement Pt may have lost momentum with respect to mobility, seemed to have more difficulty walking and changing positions.  Needed min cueing for HEP.   Renewed to get him back on track with exercises and progress.    Rehab Potential Good   PT Frequency 2x / week   PT Duration 8 weeks   PT Treatment/Interventions ADLs/Self Care Home Management;Cryotherapy;Electrical Stimulation;Moist Heat;Ultrasound;Patient/family education;Passive range of motion;Neuromuscular re-education;Balance training;Therapeutic exercise;Therapeutic activities;Manual techniques;Functional mobility training;Gait training   PT Next Visit Plan  flexion based exercises, begin stabilization and try NuStep  posture, manual and hamstring stretching   PT Home Exercise Plan single KTC, LTR and hamstring stretch , does hip flex and abd given by MD, piriformis   Consulted and Agree with Plan of Care Patient      Patient will benefit from skilled therapeutic intervention in order to improve the following deficits and impairments:  Abnormal gait, Decreased strength, Improper body mechanics, Postural dysfunction, Difficulty walking, Decreased mobility, Impaired flexibility, Impaired sensation, Decreased range of motion, Decreased balance, Pain, Increased fascial restricitons, Decreased endurance  Visit Diagnosis: Abnormal posture  Other abnormalities of gait and mobility  Other symptoms and signs involving the musculoskeletal system  Pain In Left Leg  Pain In Right Leg       G-Codes - 07/30/15 1219    Functional Assessment Tool Used clinical judgment    Functional Limitation Mobility: Walking and moving around   Mobility: Walking and Moving Around Current Status (G8978) At least 40 percent but less than 60 percent impaired, limited or restricted   Mobility: Walking and Moving Around Goal Status (G8979) At least 20 percent but less than 40 percent impaired, limited or restricted      Problem List There are no active problems to display for this patient.   PAA,JENNIFER 07/30/2015, 12:45 PM  [facility] [address] [place], [place], [address] Phone: [phone]   Fax:  [phone]  Name: Brian [name] MRN: [id number] Date of Birth: 08/26/1931    Jennifer Paa, PT 07/30/2015 12:45 PM Phone: [phone] Fax: [phone]  

## 2015-08-01 ENCOUNTER — Ambulatory Visit: Payer: Medicare Other | Admitting: Physical Therapy

## 2015-08-01 DIAGNOSIS — M79605 Pain in left leg: Secondary | ICD-10-CM

## 2015-08-01 DIAGNOSIS — R293 Abnormal posture: Secondary | ICD-10-CM | POA: Diagnosis not present

## 2015-08-01 DIAGNOSIS — R29898 Other symptoms and signs involving the musculoskeletal system: Secondary | ICD-10-CM

## 2015-08-01 DIAGNOSIS — R2689 Other abnormalities of gait and mobility: Secondary | ICD-10-CM

## 2015-08-01 DIAGNOSIS — M79604 Pain in right leg: Secondary | ICD-10-CM

## 2015-08-01 NOTE — Therapy (Signed)
[place], [place], [address] Phone: [phone]   Fax:  [phone]  Physical Therapy Treatment  Patient Details  Name: Brian [name] MRN: [id number] Date of Birth: 01-Jul-1931 Referring Provider: Benjamine Sprague  Encounter Date: 08/01/2015      PT End of Session - 08/01/15 1440    Visit Number 10   Number of Visits 25   Date for PT Re-Evaluation 09/24/15   PT Start Time 1330   PT Stop Time 1414   PT Time Calculation (min) 44 min   Equipment Utilized During Treatment Gait belt   Activity Tolerance Patient tolerated treatment well   Behavior During Therapy [facility] for tasks assessed/performed      No past medical history on file.  No past surgical history on file.  There were no vitals filed for this visit.      Subjective Assessment - 08/01/15 1328    Subjective No new complaints.  Min pain in buttocks from time to time.               [facility] Adult PT Treatment/Exercise - 08/01/15 1328    Lumbar Exercises: Stretches   Active Hamstring Stretch 3 reps;30 seconds   Active Hamstring Stretch Limitations PT assist   Lower Trunk Rotation 3 reps;30 seconds   Lumbar Exercises: Aerobic   Stationary Bike 6 min LE only 6 min    Lumbar Exercises: Standing   Other Standing Lumbar Exercises UE ranger with step for stretching   Other Standing Lumbar Exercises calf stretch off step 30 sec x 3 each    Lumbar Exercises: Supine   Clam 15 reps   Bent Knee Raise 15 reps   Bridge 10 reps   Bridge Limitations legs on ball    Other Supine Lumbar Exercises horiz abd yellow band x 20 on towel roll longitudinal on spine    Other Supine Lumbar Exercises oblique twist with ball x 8    Knee/Hip Exercises: Standing   Forward Step Up Both;1 set;10 reps;Hand Hold: 2                  PT Short Term Goals - 07/30/15 1212    PT SHORT TERM GOAL #1   Title Patient will be I with HEP   Status On-going   PT  SHORT TERM GOAL #2   Title Patient will complete balance screen and PT will set goal if needed   Status Achieved   PT SHORT TERM GOAL #3   Title Patient will demo and understand good body mechanics for basic mobility tasks.    Status Partially Met           PT Long Term Goals - 07/30/15 1212    PT LONG TERM GOAL #1   Title Patient will be able to stand for basic home tasks and report only min occasional pain in LEs   Baseline can stand for 15-20 min but pain mod    Status Partially Met   PT LONG TERM GOAL #2   Title Patient will be able to walk for 30 min for endurance and report min pain in LEs, relieved by rest.    Baseline mod    Status Partially Met   PT LONG TERM GOAL #3   Title Pt will perform sit to stand x 10 without UE assist in a reasonable amt of time to demo improved functional strength   Status Achieved   PT LONG TERM GOAL #4  Title Pt will report being able to feel less dyspnea on exertion with basic ADLs (improved 25% )   Status On-going   PT LONG TERM GOAL #5   Title Berg score will improve to 47/56 to demo decreased fall risk   Status Unable to assess               Plan - 08/01/15 1440    Clinical Impression Statement Worked on standing tolerance today, pt fatigued and diaphoretic, he believes it is from his antibiotics (for his elbow).  Addressed posture and stabilization, no increase in pain at all during session.    PT Next Visit Plan Gait with cane, standing exercises.     PT Home Exercise Plan single KTC, LTR and hamstring stretch , does hip flex and abd given by MD, piriformis   Consulted and Agree with Plan of Care Patient      Patient will benefit from skilled therapeutic intervention in order to improve the following deficits and impairments:  Abnormal gait, Decreased strength, Improper body mechanics, Postural dysfunction, Difficulty walking, Decreased mobility, Impaired flexibility, Impaired sensation, Decreased range of motion, Decreased  balance, Pain, Increased fascial restricitons, Decreased endurance  Visit Diagnosis: Abnormal posture  Other abnormalities of gait and mobility  Other symptoms and signs involving the musculoskeletal system  Pain In Left Leg  Pain In Right Leg     Problem List There are no active problems to display for this patient.   PAA,JENNIFER 08/01/2015, 2:53 PM  [facility] [address] [place], [place], [address] Phone: [phone]   Fax:  [phone]  Name: Brian [name] MRN: [id number] Date of Birth: [birth year]    Raeford Razor, PT 08/01/2015 2:53 PM Phone: [phone] Fax: [phone]

## 2015-08-01 NOTE — Patient Instructions (Signed)
Resisted Horizontal Abduction: Bilateral    Sit or stand, tubing in both hands, arms out in front. Keeping arms straight, pinch shoulder blades together and stretch arms out. Sit with your best posture.  You can also do this laying down.  Think about pulling your arms apart with upper back.  Repeat _10-20 ___ times per set. Do __1-2__ sets per session. Do ___2_ sessions per day.  http://orth.exer.us/968   Copyright  VHI. All rights reserved.

## 2015-08-06 ENCOUNTER — Ambulatory Visit: Payer: Medicare Other | Admitting: Physical Therapy

## 2015-08-06 DIAGNOSIS — R293 Abnormal posture: Secondary | ICD-10-CM | POA: Diagnosis not present

## 2015-08-06 DIAGNOSIS — M79605 Pain in left leg: Secondary | ICD-10-CM

## 2015-08-06 DIAGNOSIS — R2689 Other abnormalities of gait and mobility: Secondary | ICD-10-CM

## 2015-08-06 DIAGNOSIS — M79604 Pain in right leg: Secondary | ICD-10-CM

## 2015-08-06 DIAGNOSIS — R29898 Other symptoms and signs involving the musculoskeletal system: Secondary | ICD-10-CM

## 2015-08-06 NOTE — Therapy (Signed)
[place], [place], [address] Phone: [phone]   Fax:  [phone]  Physical Therapy Treatment  Patient Details  Name: Brian [name] MRN: [id number] Date of Birth: 1931/07/15 Referring Provider: Benjamine Sprague  Encounter Date: 08/06/2015      PT End of Session - 08/06/15 1236    Visit Number 11   Number of Visits 25   Date for PT Re-Evaluation 09/24/15   Authorization Type GCODE at 19 visit   PT Start Time 1103   PT Stop Time 1200   PT Time Calculation (min) 57 min   Equipment Utilized During Treatment Gait belt   Activity Tolerance Patient tolerated treatment well   Behavior During Therapy [facility] for tasks assessed/performed      No past medical history on file.  No past surgical history on file.  There were no vitals filed for this visit.      Subjective Assessment - 08/06/15 1235    Subjective No real pain today, cane is in the car.  Was able to spend 3 hours (with seated rest breaks) cleaning out the shed.     Currently in Pain? No/denies   Pain Score --  becomes min with certain standing ex                          [facility] Adult PT Treatment/Exercise - 08/06/15 1128    Ambulation/Gait   Ambulation/Gait Yes   Ambulation/Gait Assistance 6: Modified independent (Device/Increase time)  incr time   Ambulation Distance (Feet) 150 Feet   Assistive device Straight cane   Gait Pattern Step-to pattern;Decreased step length - right;Decreased step length - left;Decreased hip/knee flexion - right;Decreased hip/knee flexion - left;Shuffle;Lateral trunk lean to right;Lateral trunk lean to left;Decreased trunk rotation;Trunk flexed;Wide base of support   Gait Comments LLE externallly rotated   Lumbar Exercises: Stretches   Lower Trunk Rotation 5 reps;Other (comment)   Lower Trunk Rotation Limitations 2 sets knee together and knees apart for hip IR stretch    Quadruped Mid Back  Stretch Limitations used ball for trunk rotation    Lumbar Exercises: Aerobic   Stationary Bike 8 min UE and LE for endurance    Lumbar Exercises: Standing   Heel Raises 10 reps   Other Standing Lumbar Exercises high march with light UE support    Other Standing Lumbar Exercises hip flex/ext controlled at mirror for posture    Lumbar Exercises: Seated   Sit to Stand Limitations shoulder flexion with ball , focus on trunk extension x 10   seated rotation with ball x 10    Lumbar Exercises: Supine   Heel Slides 10 reps   Heel Slides Limitations with ball    Bridge 10 reps   Bridge Limitations legs on ball    Moist Heat Therapy   Number Minutes Moist Heat 12 Minutes   Moist Heat Location Lumbar Spine                PT Education - 08/06/15 1236    Education provided Yes   Education Details use of cane and adjusting height   Person(s) Educated Patient   Methods Explanation;Demonstration   Comprehension Verbalized understanding;Returned demonstration          PT Short Term Goals - 08/06/15 1238    PT SHORT TERM GOAL #1   Title Patient will be I with HEP   Status On-going   PT SHORT TERM GOAL #  2   Title Patient will complete balance screen and PT will set goal if needed   Status Achieved   PT SHORT TERM GOAL #3   Title Patient will demo and understand good body mechanics for basic mobility tasks.    Status Achieved           PT Long Term Goals - 08/06/15 1238    PT LONG TERM GOAL #1   Title Patient will be able to stand for basic home tasks and report only min occasional pain in LEs   Status Partially Met   PT LONG TERM GOAL #2   Title Patient will be able to walk for 30 min for endurance and report min pain in LEs, relieved by rest.    Status Partially Met   PT LONG TERM GOAL #3   Title Pt will perform sit to stand x 10 without UE assist in a reasonable amt of time to demo improved functional strength   Status Achieved   PT LONG TERM GOAL #4   Title Pt  will report being able to feel less dyspnea on exertion with basic ADLs (improved 25% )   Status On-going   PT LONG TERM GOAL #5   Title Berg score will improve to 47/56 to demo decreased fall risk   Status Unable to assess               Plan - 08/06/15 1236    Clinical Impression Statement Patient muchmore steady with use of cane (in either hand).  LLE more externally rotated, pt aware.  He continues to lack stamina and postural strength.  Progressing towards goals with pain managed.    PT Next Visit Plan Gait with cane, standing exercises, try DGI?    PT Home Exercise Plan single KTC, LTR and hamstring stretch , does hip flex and abd given by MD, piriformis   Consulted and Agree with Plan of Care Patient      Patient will benefit from skilled therapeutic intervention in order to improve the following deficits and impairments:  Abnormal gait, Decreased strength, Improper body mechanics, Postural dysfunction, Difficulty walking, Decreased mobility, Impaired flexibility, Impaired sensation, Decreased range of motion, Decreased balance, Pain, Increased fascial restricitons, Decreased endurance  Visit Diagnosis: Abnormal posture  Other abnormalities of gait and mobility  Other symptoms and signs involving the musculoskeletal system  Pain In Left Leg  Pain In Right Leg     Problem List There are no active problems to display for this patient.   Brian [name] 08/06/2015, 12:40 PM  [place], [place], [address] Phone: [phone]   Fax:  [phone]  Name: Brian [name] MRN: [id number] Date of Birth: [birth year]    Raeford Razor, PT 08/06/2015 12:42 PM Phone: [phone] Fax: [phone]

## 2015-08-08 ENCOUNTER — Ambulatory Visit: Payer: Medicare Other | Admitting: Physical Therapy

## 2015-08-08 DIAGNOSIS — M79604 Pain in right leg: Secondary | ICD-10-CM

## 2015-08-08 DIAGNOSIS — R293 Abnormal posture: Secondary | ICD-10-CM

## 2015-08-08 DIAGNOSIS — R29898 Other symptoms and signs involving the musculoskeletal system: Secondary | ICD-10-CM

## 2015-08-08 DIAGNOSIS — M79605 Pain in left leg: Secondary | ICD-10-CM

## 2015-08-08 DIAGNOSIS — R2689 Other abnormalities of gait and mobility: Secondary | ICD-10-CM

## 2015-08-08 NOTE — Therapy (Signed)
[place], [place], [address] Phone: [phone]   Fax:  [phone]  Physical Therapy Treatment  Patient Details  Name: ADANTE COURINGTON MRN: [id number] Date of Birth: 01/12/1932 Referring Provider: Benjamine Sprague  Encounter Date: 08/08/2015      PT End of Session - 08/08/15 1531    Visit Number 12   Number of Visits 25   Date for PT Re-Evaluation 09/24/15   PT Start Time 8546   PT Stop Time 1420   PT Time Calculation (min) 46 min   Activity Tolerance Patient tolerated treatment well   Behavior During Therapy [facility] for tasks assessed/performed      No past medical history on file.  No past surgical history on file.  There were no vitals filed for this visit.      Subjective Assessment - 08/08/15 1310    Subjective No pain, walked in with cane today.  I may have overdid it yesterday, was really tired. But pain was only min (to none).    Currently in Pain? No/denies            [facility] PT Assessment - 08/08/15 0001    Timed Up and Go Test   Normal TUG (seconds) 10.66  11, 10: improved safety and security             [facility] Adult PT Treatment/Exercise - 08/08/15 1328    Lumbar Exercises: Stretches   Single Knee to Chest Stretch 3 reps;30 seconds   Lower Trunk Rotation 5 reps   Piriformis Stretch 2 reps;30 seconds   Lumbar Exercises: Aerobic   Stationary Bike NuStep LE and UE for 8 min level 5    Lumbar Exercises: Standing   Other Standing Lumbar Exercises horiz abd and diagonal pull red x 10 each in standing to challenge endurance and postural strength , fatigued but no pain.    Lumbar Exercises: Supine   Bridge 10 reps   Bridge Limitations 2 sets, 1 set with horiz pull in UE red x 10    Shoulder Exercises: Supine   Horizontal ABduction Strengthening;Both;20 reps;Theraband   Theraband Level (Shoulder Horizontal ABduction) Level 2 (Red)   Other Supine Exercises diagonal pull red  theraband x 20 each                 PT Education - 08/08/15 1315    Education provided Yes   Education Details group therapy sessions and post PT plans    Person(s) Educated Patient   Methods Explanation   Comprehension Verbalized understanding;Returned demonstration          PT Short Term Goals - 08/06/15 1238    PT SHORT TERM GOAL #1   Title Patient will be I with HEP   Status On-going   PT SHORT TERM GOAL #2   Title Patient will complete balance screen and PT will set goal if needed   Status Achieved   PT SHORT TERM GOAL #3   Title Patient will demo and understand good body mechanics for basic mobility tasks.    Status Achieved           PT Long Term Goals - 08/06/15 1238    PT LONG TERM GOAL #1   Title Patient will be able to stand for basic home tasks and report only min occasional pain in LEs   Status Partially Met   PT LONG TERM GOAL #2   Title Patient will be able to walk for 30  min for endurance and report min pain in LEs, relieved by rest.    Status Partially Met   PT LONG TERM GOAL #3   Title Pt will perform sit to stand x 10 without UE assist in a reasonable amt of time to demo improved functional strength   Status Achieved   PT LONG TERM GOAL #4   Title Pt will report being able to feel less dyspnea on exertion with basic ADLs (improved 25% )   Status On-going   PT LONG TERM GOAL #5   Title Berg score will improve to 47/56 to demo decreased fall risk   Status Unable to assess               Plan - 08/08/15 1531    Clinical Impression Statement TUG tested today, remained less than 13 sec but more steady with cane.  Pt has improved a great deal in his amt of pain he experiences with activity.  He struggles with his loss of stamina and wonders if it is a side effect of his medications.  Addressed posture with upper body exercises.  No further goals met.    PT Next Visit Plan Gait with cane, standing exercises, try DGI?    PT Home Exercise  Plan single KTC, LTR and hamstring stretch , does hip flex and abd given by MD, piriformis, horiz and diagonal pull yellow band    Consulted and Agree with Plan of Care Patient      Patient will benefit from skilled therapeutic intervention in order to improve the following deficits and impairments:  Abnormal gait, Decreased strength, Improper body mechanics, Postural dysfunction, Difficulty walking, Decreased mobility, Impaired flexibility, Impaired sensation, Decreased range of motion, Decreased balance, Pain, Increased fascial restricitons, Decreased endurance  Visit Diagnosis: Abnormal posture  Other abnormalities of gait and mobility  Other symptoms and signs involving the musculoskeletal system  Pain In Left Leg  Pain In Right Leg     Problem List There are no active problems to display for this patient.   Therin Vetsch 08/08/2015, 3:42 PM  [facility] [address] [place], [place], [address] Phone: [phone]   Fax:  [phone]  Name: ORRIN YURKOVICH MRN: [id number] Date of Birth: [birth year]    Raeford Razor, PT 08/08/2015 3:42 PM Phone: [phone] Fax: [phone]

## 2015-08-13 ENCOUNTER — Ambulatory Visit: Payer: Medicare Other | Admitting: Physical Therapy

## 2015-08-13 DIAGNOSIS — M79605 Pain in left leg: Secondary | ICD-10-CM

## 2015-08-13 DIAGNOSIS — R29898 Other symptoms and signs involving the musculoskeletal system: Secondary | ICD-10-CM

## 2015-08-13 DIAGNOSIS — R293 Abnormal posture: Secondary | ICD-10-CM | POA: Diagnosis not present

## 2015-08-13 DIAGNOSIS — M79604 Pain in right leg: Secondary | ICD-10-CM

## 2015-08-13 DIAGNOSIS — R2689 Other abnormalities of gait and mobility: Secondary | ICD-10-CM

## 2015-08-13 NOTE — Therapy (Signed)
[place], [place], [address] Phone: [phone]   Fax:  [phone]  Physical Therapy Treatment  Patient Details  Name: NITIN MCKOWEN MRN: [id number] Date of Birth: 1932-01-13 Referring Provider: Benjamine Sprague  Encounter Date: 08/13/2015      PT End of Session - 08/13/15 1223    Visit Number 13   Number of Visits 25   Date for PT Re-Evaluation 09/24/15   Authorization Type GCODE at 19 visit   PT Start Time 1151   PT Stop Time 1233   PT Time Calculation (min) 42 min   Equipment Utilized During Treatment Gait belt   Activity Tolerance Patient tolerated treatment well   Behavior During Therapy [facility] for tasks assessed/performed      No past medical history on file.  No past surgical history on file.  There were no vitals filed for this visit.      Subjective Assessment - 08/13/15 1155    Subjective My back did real good this weekend. I'm just so tired! Did some yardwork Saturday.    Currently in Pain? No/denies                         [facility] Adult PT Treatment/Exercise - 08/13/15 1158    Dynamic Gait Index   Level Surface Normal   Change in Gait Speed Mild Impairment   Gait with Horizontal Head Turns Mild Impairment   Gait with Vertical Head Turns Moderate Impairment   Gait and Pivot Turn Severe Impairment   Step Over Obstacle Moderate Impairment   Step Around Obstacles Normal   Steps Mild Impairment   Total Score 14   Lumbar Exercises: Stretches   Active Hamstring Stretch 3 reps;30 seconds   Active Hamstring Stretch Limitations seated, leg propped   Single Knee to Chest Stretch 3 reps;30 seconds   Lower Trunk Rotation Limitations x 10    Piriformis Stretch 3 reps;10 seconds   Piriformis Stretch Limitations upper trunk rotation    Lumbar Exercises: Aerobic   Stationary Bike NuStep UE and LE 5 min level 3-4 for 5 min post session    UBE (Upper Arm Bike) level 1 FW and  back 3 min each with cues for posture                 PT Education - 08/13/15 1223    Education provided Yes   Education Details cane, Dynamic gait index results   Person(s) Educated Patient   Methods Explanation   Comprehension Verbalized understanding          PT Short Term Goals - 08/06/15 1238    PT SHORT TERM GOAL #1   Title Patient will be I with HEP   Status On-going   PT SHORT TERM GOAL #2   Title Patient will complete balance screen and PT will set goal if needed   Status Achieved   PT SHORT TERM GOAL #3   Title Patient will demo and understand good body mechanics for basic mobility tasks.    Status Achieved           PT Long Term Goals - 08/06/15 1238    PT LONG TERM GOAL #1   Title Patient will be able to stand for basic home tasks and report only min occasional pain in LEs   Status Partially Met   PT LONG TERM GOAL #2   Title Patient will be able to walk for 30  min for endurance and report min pain in LEs, relieved by rest.    Status Partially Met   PT LONG TERM GOAL #3   Title Pt will perform sit to stand x 10 without UE assist in a reasonable amt of time to demo improved functional strength   Status Achieved   PT LONG TERM GOAL #4   Title Pt will report being able to feel less dyspnea on exertion with basic ADLs (improved 25% )   Status On-going   PT LONG TERM GOAL #5   Title Berg score will improve to 47/56 to demo decreased fall risk   Status Unable to assess               Plan - 08/13/15 1224    Clinical Impression Statement Patient with mod risk of falling with DGI using cane (14/24).  Cont to have min to no pain with mobiity, doing more around the house and yard.    PT Next Visit Plan Gait with cane, standing exercises, balance   PT Home Exercise Plan single KTC, LTR and hamstring stretch , does hip flex and abd given by MD, piriformis, horiz and diagonal pull yellow band    Consulted and Agree with Plan of Care Patient       Patient will benefit from skilled therapeutic intervention in order to improve the following deficits and impairments:  Abnormal gait, Decreased strength, Improper body mechanics, Postural dysfunction, Difficulty walking, Decreased mobility, Impaired flexibility, Impaired sensation, Decreased range of motion, Decreased balance, Pain, Increased fascial restricitons, Decreased endurance  Visit Diagnosis: Abnormal posture  Other abnormalities of gait and mobility  Other symptoms and signs involving the musculoskeletal system  Pain In Left Leg  Pain In Right Leg     Problem List There are no active problems to display for this patient.   Shardai Star 08/13/2015, 1:33 PM  [place], [place], [address] Phone: [phone]   Fax:  [phone]  Name: KAVONTE BEARSE MRN: [id number] Date of Birth: 02-18-1932    Raeford Razor, PT 08/13/2015 1:33 PM Phone: [phone] Fax: [phone]

## 2015-08-15 ENCOUNTER — Ambulatory Visit: Payer: Medicare Other | Admitting: Physical Therapy

## 2015-08-15 DIAGNOSIS — R293 Abnormal posture: Secondary | ICD-10-CM | POA: Diagnosis not present

## 2015-08-15 DIAGNOSIS — M79604 Pain in right leg: Secondary | ICD-10-CM

## 2015-08-15 DIAGNOSIS — M79605 Pain in left leg: Secondary | ICD-10-CM

## 2015-08-15 DIAGNOSIS — R29898 Other symptoms and signs involving the musculoskeletal system: Secondary | ICD-10-CM

## 2015-08-15 DIAGNOSIS — R2689 Other abnormalities of gait and mobility: Secondary | ICD-10-CM

## 2015-08-15 NOTE — Therapy (Signed)
[place], [place], [address] Phone: [phone]   Fax:  [phone]  Physical Therapy Treatment  Patient Details  Name: Brian [name] MRN: [id number] Date of Birth: 12-07-1931 Referring Provider: Benjamine Sprague  Encounter Date: 08/15/2015      PT End of Session - 08/15/15 1311    Visit Number 14   Number of Visits 25   Date for PT Re-Evaluation 09/24/15   Authorization Type GCODE at 19 visit   PT Start Time 1303   PT Stop Time 1345   PT Time Calculation (min) 42 min   Activity Tolerance Patient tolerated treatment well   Behavior During Therapy [facility] for tasks assessed/performed      No past medical history on file.  No past surgical history on file.  There were no vitals filed for this visit.      Subjective Assessment - 08/15/15 1309    Subjective I think youre helping me. I get a shooting pain (some back but mostly buttock)  when I twist or turn the wrong way.  But it goes away.    Currently in Pain? No/denies              [facility] Adult PT Treatment/Exercise - 08/15/15 1321    Lumbar Exercises: Stretches   Single Knee to Chest Stretch 2 reps;30 seconds   Lower Trunk Rotation Limitations x 10 on ball    Lumbar Exercises: Aerobic   Stationary Bike NuStep L5, UE and LE for warm up, 6 min    Lumbar Exercises: Standing   Row Strengthening;Both;15 reps   Row Limitations 2 plates    Other Standing Lumbar Exercises diagonal pull 1 plate x 10    Other Standing Lumbar Exercises oblique twist 1 plate x 8 each side    Lumbar Exercises: Supine   Clam 15 reps   Clam Limitations reb band for 1 set, 1 set without   Heel Slides 10 reps   Heel Slides Limitations with ball    Bridge 10 reps   Bridge Limitations legs on ball    Straight Leg Raise 10 reps   Straight Leg Raises Limitations legs on swiss ball    Other Supine Lumbar Exercises oblique twist with ball x 8                  PT Education - 08/15/15 1719    Education provided Yes   Education Details progress, posture in standing and anatomy, goals, prognosis    Person(s) Educated Patient   Methods Explanation;Demonstration;Verbal cues;Tactile cues   Comprehension Verbalized understanding;Returned demonstration;Need further instruction          PT Short Term Goals - 08/15/15 1346    PT SHORT TERM GOAL #1   Title Patient will be I with HEP   Status Achieved   PT SHORT TERM GOAL #2   Title Patient will complete balance screen and PT will set goal if needed   Status Achieved   PT SHORT TERM GOAL #3   Title Patient will demo and understand good body mechanics for basic mobility tasks.    Status Achieved           PT Long Term Goals - 08/15/15 1342    PT LONG TERM GOAL #1   Title Patient will be able to stand for basic home tasks and report only min occasional pain in LEs   Baseline can do 15 min, pain no more than a 3/10.  Status Partially Met   PT LONG TERM GOAL #2   Title Patient will be able to walk for 30 min for endurance and report min pain in LEs, relieved by rest.    Status Achieved   PT LONG TERM GOAL #3   Title Pt will perform sit to stand x 10 without UE assist in a reasonable amt of time to demo improved functional strength   Status Achieved   PT LONG TERM GOAL #4   Title Pt will report being able to feel less dyspnea on exertion with basic ADLs (improved 25% )   Status On-going   PT LONG TERM GOAL #5   Title Berg score will improve to 47/56 to demo decreased fall risk   Status Unable to assess               Plan - 08/15/15 1333    Clinical Impression Statement Patient has pain (min ) in post thighs and buttocks with spine extension during standing exercises.  Improvement in pain and hip flexibility but very fatigued and diaphoretic with minimal exercise, dyspnea on exertion that is the same as when he began therapy.    PT Next Visit Plan Gait with cane, standing  exercises, balance, core and trunk flexibility    PT Home Exercise Plan single KTC, LTR and hamstring stretch , does hip flex and abd given by MD, piriformis, horiz and diagonal pull yellow band    Consulted and Agree with Plan of Care Patient      Patient will benefit from skilled therapeutic intervention in order to improve the following deficits and impairments:  Abnormal gait, Decreased strength, Improper body mechanics, Postural dysfunction, Difficulty walking, Decreased mobility, Impaired flexibility, Impaired sensation, Decreased range of motion, Decreased balance, Pain, Increased fascial restricitons, Decreased endurance  Visit Diagnosis: Abnormal posture  Other abnormalities of gait and mobility  Other symptoms and signs involving the musculoskeletal system  Pain In Left Leg  Pain In Right Leg     Problem List There are no active problems to display for this patient.   Brian [name] 08/15/2015, 5:23 PM  [facility] [address] [place], [place], [address] Phone: [phone]   Fax:  [phone]  Name: Brian [name] MRN: [id number] Date of Birth: 10-12-1931   Raeford Razor, PT 08/15/2015 5:24 PM Phone: [phone] Fax: [phone]

## 2015-08-22 ENCOUNTER — Ambulatory Visit: Payer: Medicare Other | Admitting: Physical Therapy

## 2015-08-24 ENCOUNTER — Ambulatory Visit: Payer: Medicare Other | Attending: Internal Medicine | Admitting: Physical Therapy

## 2015-08-24 DIAGNOSIS — M79605 Pain in left leg: Secondary | ICD-10-CM | POA: Diagnosis present

## 2015-08-24 DIAGNOSIS — R29898 Other symptoms and signs involving the musculoskeletal system: Secondary | ICD-10-CM | POA: Diagnosis present

## 2015-08-24 DIAGNOSIS — R2681 Unsteadiness on feet: Secondary | ICD-10-CM | POA: Insufficient documentation

## 2015-08-24 DIAGNOSIS — R2689 Other abnormalities of gait and mobility: Secondary | ICD-10-CM | POA: Diagnosis present

## 2015-08-24 DIAGNOSIS — R293 Abnormal posture: Secondary | ICD-10-CM | POA: Diagnosis not present

## 2015-08-24 DIAGNOSIS — M79604 Pain in right leg: Secondary | ICD-10-CM | POA: Diagnosis present

## 2015-08-24 DIAGNOSIS — Z7409 Other reduced mobility: Secondary | ICD-10-CM | POA: Diagnosis present

## 2015-08-24 NOTE — Therapy (Signed)
[place], [place], [address] Phone: [phone]   Fax:  [phone]  Physical Therapy Treatment  Patient Details  Name: Brian [name] MRN: [id number] Date of Birth: 11/04/1931 Referring Provider: Benjamine Sprague  Encounter Date: 08/24/2015      PT End of Session - 08/24/15 1145    Visit Number 15   Number of Visits 25   Date for PT Re-Evaluation 09/24/15   PT Start Time 1102   PT Stop Time 1145   PT Time Calculation (min) 43 min   Equipment Utilized During Treatment Gait belt   Activity Tolerance Patient tolerated treatment well   Behavior During Therapy [facility] for tasks assessed/performed      No past medical history on file.  No past surgical history on file.  There were no vitals filed for this visit.      Subjective Assessment - 08/24/15 1112    Subjective I overdid a little bit. My back doesnt hurt much, sore in buttocks a bit.    Currently in Pain? Yes   Pain Score 2    Pain Location Leg   Pain Orientation Right;Left;Posterior;Proximal   Pain Descriptors / Indicators Aching   Pain Type Chronic pain   Pain Onset More than a month ago   Pain Frequency Intermittent            [facility] Adult PT Treatment/Exercise - 08/24/15 1125    Lumbar Exercises: Stretches   Single Knee to Chest Stretch 3 reps;30 seconds   Lower Trunk Rotation Limitations x 10 on mat    Piriformis Stretch 3 reps;10 seconds   Piriformis Stretch Limitations upper trunk rotation    Lumbar Exercises: Aerobic   Stationary Bike NuStep UE and LE level 6 for 6 min    Lumbar Exercises: Standing   Row Strengthening;Both;20 reps;Theraband   Theraband Level (Row) Level 4 (Blue)   Other Standing Lumbar Exercises step ups FW and latera x 10 each LE    Other Standing Lumbar Exercises hip abduction x 10 each LE , needs definite UE support    Shoulder Exercises: Standing   Flexion Strengthening;Both;15 reps;Theraband   Theraband Level (Shoulder Flexion) Level 1 (Yellow)   Shoulder Flexion Weight (lbs) facing back with scap adduction                 PT Education - 08/24/15 1144    Education provided Yes   Education Details rollling, body mechanics    Person(s) Educated Patient   Methods Explanation   Comprehension Verbalized understanding          PT Short Term Goals - 08/24/15 1155    PT SHORT TERM GOAL #1   Title Patient will be I with HEP   Status Achieved   PT SHORT TERM GOAL #2   Title Patient will complete balance screen and PT will set goal if needed   Status Achieved   PT SHORT TERM GOAL #3   Title Patient will demo and understand good body mechanics for basic mobility tasks.    Status Achieved           PT Long Term Goals - 08/24/15 1155    PT LONG TERM GOAL #1   Title Patient will be able to stand for basic home tasks and report only min occasional pain in LEs   Status Partially Met   PT LONG TERM GOAL #2   Title Patient will be able to walk for 30 min for  endurance and report min pain in LEs, relieved by rest.    Status Achieved   PT LONG TERM GOAL #3   Title Pt will perform sit to stand x 10 without UE assist in a reasonable amt of time to demo improved functional strength   Status Achieved   PT LONG TERM GOAL #4   Title Pt will report being able to feel less dyspnea on exertion with basic ADLs (improved 25% )   Status On-going   PT LONG TERM GOAL #5   Title Berg score will improve to 47/56 to demo decreased fall risk   Status Unable to assess               Plan - 08/24/15 1146    Clinical Impression Statement Pt feels better after session and activity in general.  Pt cont to have decreased muscle endurance with standing in legs and back.  No further goals met, encouraged him to rest this weekend.   PT Next Visit Plan Gait with cane, standing exercises, balance, core and trunk flexibility    PT Home Exercise Plan single KTC, LTR and hamstring stretch ,  does hip flex and abd given by MD, piriformis, horiz and diagonal pull yellow band    Consulted and Agree with Plan of Care Patient      Patient will benefit from skilled therapeutic intervention in order to improve the following deficits and impairments:  Abnormal gait, Decreased strength, Improper body mechanics, Postural dysfunction, Difficulty walking, Decreased mobility, Impaired flexibility, Impaired sensation, Decreased range of motion, Decreased balance, Pain, Increased fascial restricitons, Decreased endurance  Visit Diagnosis: Abnormal posture  Other abnormalities of gait and mobility  Other symptoms and signs involving the musculoskeletal system  Pain In Left Leg  Pain In Right Leg     Problem List There are no active problems to display for this patient.   Reshanda Lewey 08/24/2015, 11:56 AM  [facility] [address] [place], [place], [address] Phone: [phone]   Fax:  [phone]  Name: YANUEL TAGG MRN: [id number] Date of Birth: 11-Jun-1931    Raeford Razor, PT 08/24/2015 11:56 AM Phone: [phone] Fax: [phone]

## 2015-08-28 ENCOUNTER — Ambulatory Visit: Payer: Medicare Other | Admitting: Physical Therapy

## 2015-08-28 DIAGNOSIS — M79605 Pain in left leg: Secondary | ICD-10-CM

## 2015-08-28 DIAGNOSIS — M79604 Pain in right leg: Secondary | ICD-10-CM

## 2015-08-28 DIAGNOSIS — R293 Abnormal posture: Secondary | ICD-10-CM

## 2015-08-28 DIAGNOSIS — R2689 Other abnormalities of gait and mobility: Secondary | ICD-10-CM

## 2015-08-28 DIAGNOSIS — R29898 Other symptoms and signs involving the musculoskeletal system: Secondary | ICD-10-CM

## 2015-08-28 NOTE — Therapy (Signed)
[place], [place], [address] Phone: [phone]   Fax:  [phone]  Physical Therapy Treatment  Patient Details  Name: Brian [name] MRN: [id number] Date of Birth: Dec 02, 1931 Referring Provider: Benjamine Sprague  Encounter Date: 08/28/2015      PT End of Session - 08/28/15 1233    Visit Number 16   Number of Visits 25   Date for PT Re-Evaluation 09/24/15   Authorization Type GCODE at 19 visit   PT Start Time 1148   PT Stop Time 1235   PT Time Calculation (min) 47 min   Equipment Utilized During Treatment Gait belt   Activity Tolerance Patient tolerated treatment well   Behavior During Therapy [facility] for tasks assessed/performed      No past medical history on file.  No past surgical history on file.  There were no vitals filed for this visit.      Subjective Assessment - 08/28/15 1157    Subjective Patient reports pain yesterday , cannot identfy reason.  None today.  Balance is my biggest problem.    Currently in Pain? No/denies            [facility] Adult PT Treatment/Exercise - 08/28/15 1158    High Level Balance   High Level Balance Activities Side stepping;Backward walking;Head turns;Marching forwards;Negotitating around obstacles   High Level Balance Comments min HHA step to pattern with retrowalking and increased body tension     Self-Care   Self-Care Other Self-Care Comments   Other Self-Care Comments  footwear, supportive for balance and stability   Knee/Hip Exercises: Standing   Hip Flexion Stengthening;Both;1 set;20 reps   Hip Flexion Limitations foam    Functional Squat 1 set;10 reps   Functional Squat Limitations foam    Other Standing Knee Exercises eyes closed 10 sec x 2   on foam    Other Standing Knee Exercises static standing balance UE overhead with cane   rotation with min guard assist                PT Education - 08/28/15 1250    Education provided Yes   Education Details Pt was edu on supportitive footwear for improved balance and stability. Pt's son also informed of recommnedation for a tennis shoe for added stabiltiy and support.    Person(s) Educated Patient;Child(ren)   Methods Explanation   Comprehension Verbalized understanding          PT Short Term Goals - 08/24/15 1155    PT SHORT TERM GOAL #1   Title Patient will be I with HEP   Status Achieved   PT SHORT TERM GOAL #2   Title Patient will complete balance screen and PT will set goal if needed   Status Achieved   PT SHORT TERM GOAL #3   Title Patient will demo and understand good body mechanics for basic mobility tasks.    Status Achieved           PT Long Term Goals - 08/24/15 1155    PT LONG TERM GOAL #1   Title Patient will be able to stand for basic home tasks and report only min occasional pain in LEs   Status Partially Met   PT LONG TERM GOAL #2   Title Patient will be able to walk for 30 min for endurance and report min pain in LEs, relieved by rest.    Status Achieved   PT LONG TERM GOAL #3   Title Pt  will perform sit to stand x 10 without UE assist in a reasonable amt of time to demo improved functional strength   Status Achieved   PT LONG TERM GOAL #4   Title Pt will report being able to feel less dyspnea on exertion with basic ADLs (improved 25% )   Status On-going   PT LONG TERM GOAL #5   Title Berg score will improve to 47/56 to demo decreased fall risk   Status Unable to assess               Plan - 08/28/15 1253    Clinical Impression Statement Pt still presenting with generalized sciatic pain and reporting decreased balance which seems to be his biggest problem. Pt with decreased dynamic standing balance with posterior lean in weight bearing. Pt required increased support of min assistance to maintain balance when no external support in front of him. Pt open to using a more supportive shoe to improve balance.     PT Next Visit Plan Pt to  bring his tennis shoes from home for therapist to assess. Plan to assess pt's goals further and continue with standing balance activities and overall strenght and endurance.    PT Home Exercise Plan Pt to continue his current HEP   Consulted and Agree with Plan of Care Patient      Patient will benefit from skilled therapeutic intervention in order to improve the following deficits and impairments:  Abnormal gait, Decreased strength, Improper body mechanics, Postural dysfunction, Difficulty walking, Decreased mobility, Impaired flexibility, Impaired sensation, Decreased range of motion, Decreased balance, Pain, Increased fascial restricitons, Decreased endurance  Visit Diagnosis: Abnormal posture  Other abnormalities of gait and mobility  Other symptoms and signs involving the musculoskeletal system  Pain In Left Leg  Pain In Right Leg     Problem List There are no active problems to display for this patient.   PAA,Elsye Mccollister 08/28/2015, 1:17 PM  [place], [place], [address] Phone: [phone]   Fax:  [phone]  Name: NUR KRASINSKI MRN: [id number] Date of Birth: 1931-08-17  Kearney Hard, PT 08/28/2015 1:17 PM    Raeford Razor, PT 08/28/2015 1:17 PM Phone: [phone] Fax: [phone]

## 2015-08-30 ENCOUNTER — Ambulatory Visit: Payer: Medicare Other | Admitting: Physical Therapy

## 2015-08-30 DIAGNOSIS — R293 Abnormal posture: Secondary | ICD-10-CM

## 2015-08-30 DIAGNOSIS — R2689 Other abnormalities of gait and mobility: Secondary | ICD-10-CM

## 2015-08-30 DIAGNOSIS — R29898 Other symptoms and signs involving the musculoskeletal system: Secondary | ICD-10-CM

## 2015-08-30 DIAGNOSIS — M79605 Pain in left leg: Secondary | ICD-10-CM

## 2015-08-30 DIAGNOSIS — M79604 Pain in right leg: Secondary | ICD-10-CM

## 2015-08-30 NOTE — Therapy (Signed)
[place], [place], [address] Phone: [phone]   Fax:  [phone]  Physical Therapy Treatment  Patient Details  Name: Brian [name] MRN: [id number] Date of Birth: 11/21/1931 Referring Provider: Benjamine Sprague  Encounter Date: 08/30/2015      PT End of Session - 08/30/15 1346    Visit Number 17   Number of Visits 25   Date for PT Re-Evaluation 09/24/15   Authorization Type GCODE at 19 visit   PT Start Time 1340  8 min late.    PT Stop Time 1420   PT Time Calculation (min) 40 min   Equipment Utilized During Treatment Gait belt   Activity Tolerance Patient tolerated treatment well   Behavior During Therapy WFL for tasks assessed/performed      No past medical history on file.  No past surgical history on file.  There were no vitals filed for this visit.      Subjective Assessment - 08/30/15 1344    Subjective Pain in bilateral ant hips (groin), 4/10.  Brought sneakers in today for PT.    Currently in Pain? Yes   Pain Score 4    Pain Location Groin   Pain Orientation Right;Left   Pain Descriptors / Indicators Aching   Pain Type Chronic pain   Pain Onset More than a month ago   Pain Frequency Intermittent   Aggravating Factors  was crawling around on the floor installing cable   Pain Relieving Factors rest, sit, heat            [facility] PT Assessment - 08/30/15 1352    Berg Balance Test   Sit to Stand Able to stand without using hands and stabilize independently   Standing Unsupported Able to stand safely 2 minutes   Sitting with Back Unsupported but Feet Supported on Floor or Stool Able to sit safely and securely 2 minutes   Stand to Sit Sits safely with minimal use of hands   Transfers Able to transfer safely, minor use of hands   Standing Unsupported with Eyes Closed Able to stand 10 seconds with supervision   Standing Ubsupported with Feet Together Able to place feet together  independently and stand for 1 minute with supervision   From Standing, Reach Forward with Outstretched Arm Can reach forward >12 cm safely (5")   From Standing Position, Pick up Object from Floor Able to pick up shoe safely and easily   From Standing Position, Turn to Look Behind Over each Shoulder Looks behind one side only/other side shows less weight shift   Turn 360 Degrees Able to turn 360 degrees safely but slowly   Standing Unsupported, Alternately Place Feet on Step/Stool Able to complete >2 steps/needs minimal assist   Standing Unsupported, One Foot in Front Needs help to step but can hold 15 seconds   Standing on One Leg Tries to lift leg/unable to hold 3 seconds but remains standing independently   Total Score 41   Berg comment: 41/56           [facility] Adult PT Treatment/Exercise - 08/30/15 1352    Ambulation/Gait   Ambulation/Gait Yes   Ambulation/Gait Assistance 6: Modified independent (Device/Increase time)   Ambulation Distance (Feet) 300 Feet   Assistive device Straight cane   Gait Pattern Step-to pattern;Decreased dorsiflexion - right;Decreased dorsiflexion - left;Shuffle;Trunk flexed;Wide base of support   Ambulation Surface Unlevel;Outdoor;Paved   Gait Comments better stability with new shoes (lace up sneakers), shoes were more  supportive but he still supinates and puts increased pressure on lateral toe box (too narrow).                 PT Education - 08/30/15 1433    Education provided Yes   Education Details footwear options (still needs more cushion, support, wider toebox and elastic toes or velcro), Berg balance score and need for device at all times.    Person(s) Educated Patient   Methods Explanation;Demonstration   Comprehension Verbalized understanding;Returned demonstration          PT Short Term Goals - 08/24/15 1155    PT SHORT TERM GOAL #1   Title Patient will be I with HEP   Status Achieved   PT SHORT TERM GOAL #2   Title Patient will  complete balance screen and PT will set goal if needed   Status Achieved   PT SHORT TERM GOAL #3   Title Patient will demo and understand good body mechanics for basic mobility tasks.    Status Achieved           PT Long Term Goals - 08/24/15 1155    PT LONG TERM GOAL #1   Title Patient will be able to stand for basic home tasks and report only min occasional pain in LEs   Status Partially Met   PT LONG TERM GOAL #2   Title Patient will be able to walk for 30 min for endurance and report min pain in LEs, relieved by rest.    Status Achieved   PT LONG TERM GOAL #3   Title Pt will perform sit to stand x 10 without UE assist in a reasonable amt of time to demo improved functional strength   Status Achieved   PT LONG TERM GOAL #4   Title Pt will report being able to feel less dyspnea on exertion with basic ADLs (improved 25% )   Status On-going   PT LONG TERM GOAL #5   Title Berg score will improve to 47/56 to demo decreased fall risk   Status Unable to assess               Plan - 08/30/15 1437    Clinical Impression Statement Patient presents with new symptom of ant hip/groin pain, likely due to overactivity yesterday.  Pts balance score did not change 41/56 indicating the lower end of low risk of falls.  I recommended he NOT be without an assistive device.  He currently uses RW at home for when he does "laps" for stamina, cane when in the community.     PT Next Visit Plan Cont to work balance, gait and flexion based core, hip    PT Home Exercise Plan Pt to continue his current HEP   Consulted and Agree with Plan of Care Patient      Patient will benefit from skilled therapeutic intervention in order to improve the following deficits and impairments:  Abnormal gait, Decreased strength, Improper body mechanics, Postural dysfunction, Difficulty walking, Decreased mobility, Impaired flexibility, Impaired sensation, Decreased range of motion, Decreased balance, Pain, Increased  fascial restricitons, Decreased endurance  Visit Diagnosis: Abnormal posture  Other abnormalities of gait and mobility  Other symptoms and signs involving the musculoskeletal system  Pain In Left Leg  Pain In Right Leg     Problem List There are no active problems to display for this patient.   Mallie Linnemann 08/30/2015, 2:46 PM  [place], [place],  [address] Phone: [phone]   Fax:  [phone]  Name: Brian [name] MRN: [id number] Date of Birth: 01/15/1932    Raeford Razor, PT 08/30/2015 2:46 PM Phone: [phone] Fax: [phone]

## 2015-09-04 ENCOUNTER — Encounter: Payer: Self-pay | Admitting: Physical Therapy

## 2015-09-04 ENCOUNTER — Ambulatory Visit: Payer: Medicare Other | Admitting: Physical Therapy

## 2015-09-04 VITALS — BP 128/85 | HR 71

## 2015-09-04 DIAGNOSIS — R293 Abnormal posture: Secondary | ICD-10-CM

## 2015-09-04 DIAGNOSIS — R29898 Other symptoms and signs involving the musculoskeletal system: Secondary | ICD-10-CM

## 2015-09-04 DIAGNOSIS — M79605 Pain in left leg: Secondary | ICD-10-CM

## 2015-09-04 DIAGNOSIS — Z7409 Other reduced mobility: Secondary | ICD-10-CM

## 2015-09-04 DIAGNOSIS — M79604 Pain in right leg: Secondary | ICD-10-CM

## 2015-09-04 DIAGNOSIS — R2681 Unsteadiness on feet: Secondary | ICD-10-CM

## 2015-09-04 NOTE — Patient Instructions (Signed)
Pt instructed to always use his straight cane for amb at home and in community to improve balance and stability and decrease fall risk. Pt instructed to continue his current HEP.

## 2015-09-04 NOTE — Therapy (Signed)
[place], [place], [address] Phone: [phone]   Fax:  [phone]  Physical Therapy Treatment  Patient Details  Name: Brian [name] MRN: [id number] Date of Birth: [birth year] Referring Provider: Benjamine Sprague  Encounter Date: 09/04/2015      PT End of Session - 09/04/15 1340    Visit Number 18   Number of Visits 25   Date for PT Re-Evaluation 09/24/15   Authorization Type GCODE at 19 visit   PT Start Time 1335   PT Stop Time 1425   PT Time Calculation (min) 50 min   Equipment Utilized During Treatment Gait belt   Activity Tolerance Patient tolerated treatment well   Behavior During Therapy [facility] for tasks assessed/performed      History reviewed. No pertinent past medical history.  History reviewed. No pertinent past surgical history.  Filed Vitals:   09/04/15 1338  BP: 128/85  Pulse: 71  SpO2: 95%   Following exercises pt's O2 sats decreased to 91-91% on Room Air (RA). Pt was instructed in deep breathing exercises with pursed lip breathing and pt's sats increased to 98%. At rest with normal breaths pt's O2 sats ranged from 94-96% on RA.  BP was taken again and was recorded 128/82 sitting. RR was 18 bpm sitting at edge of mat table. HR ranged 71-82 bpm throughout session when pulse ox was used for monitoring.       Subjective Assessment - 09/04/15 1437    Subjective pt reporting no pain during at start of session. Pt did report intermittent pain in bilateral hip/groin 3/10. Pt reporting that he was not feeling too well today. Pt also stated that he was going to shop for new shoes once his son was back in town and could take him. Pt reports he is going to [employer] where his son recently bought a new pair of shoes .    Pertinent History DM, HTN, Neuropathy, Unicompartmental knee replacement    Limitations Standing;Walking;House hold activities;Lifting   Currently in Pain? No/denies   Pain  Score 0-No pain   Aggravating Factors  Pt reports intermittent pain at home after performing household chores in bilateral hips/groin area. more on the Left than Right, no pain reported at start of session   Pain Relieving Factors rest, sitting, heat   Effect of Pain on Daily Activities Pt reports he can not do as much as he use to. Pt limited when walking long distances and standing prolonged.   Multiple Pain Sites No                        [facility] Adult PT Treatment/Exercise - 09/04/15 1411    Ambulation/Gait   Ambulation Distance (Feet) 200 Feet  with SBA with gait belt   Assistive device Straight cane   Gait Pattern Step-through pattern;Decreased dorsiflexion - right;Decreased dorsiflexion - left;Shuffle;Trunk flexed;Wide base of support;Poor foot clearance - left;Poor foot clearance - right  pt required increased time for amb, instructions for pacing   Ambulation Surface Indoor;Level   Gait Comments better stability with tennis shoes from home, however pt reports he is waiting until next week to go get new shoes with his son. Pt reports his son is going to take him to [employer] to get proper fitting.  Still recommending wider toe box, arch support, and velcro or bungy cord laces for ease of donning.    High Level Balance   High Level Balance Activities  Head turns  and trunk rotation with standing with SBA in front of mat   High Level Balance Comments Forward stepping x 10 on Rt and Left LE, Alternating forward stepping x 10 each LE. Weight shifting side to side while standing. Pt with posterior lean and several LOB where he required mininum assist to recover using the gait belt. Pt required instructions for keeping his weight forward and centered when stepping.    Lumbar Exercises: Standing   Other Standing Lumbar Exercises hip abduction, hip extension, calf raises all x 10 each LE , needs definite UE support . Pt had difficutly with maintaining L knee extension during calf  raises. Pt was edu and tactile and verbal instructions were given, but pt unable to perform with full knee extension   Lumbar Exercises: Seated   Sit to Stand Limitations Sit to stand focusing on forward flexion and lift off with feet shoulder witdth apart using no upper extremity support. x 10 from mat table. Pt with 3 episodes of posterior lean and push off using his posterior knees on the mat table when standing. Pt was instructed to scoot forward and in proper foot placement and forward trunk progression during standing.                 PT Education - 09/04/15 1443    Education provided Yes   Education Details Pt instructed to continue his HEP and pace himself. Pt also instructed in proper sit to stand technique for improved balance. Pt instructed to use his cane for all amb for increased balance and stability.    Person(s) Educated Patient   Methods Explanation   Comprehension Verbalized understanding          PT Short Term Goals - 09/04/15 1455    PT SHORT TERM GOAL #1   Title Patient will be I with HEP   Time 4   Period Weeks   Status Achieved   PT SHORT TERM GOAL #2   Title Patient will complete balance screen and PT will set goal if needed   Time 2   Period Weeks   Status Achieved   PT SHORT TERM GOAL #3   Title Patient will demo and understand good body mechanics for basic mobility tasks.    Time 4   Period Weeks   Status Achieved           PT Long Term Goals - 09/04/15 1456    PT LONG TERM GOAL #1   Title Patient will be able to stand for basic home tasks and report only min occasional pain in LEs   Baseline can do 15 min, pain no more than a 3/10.    Time 8   Period Weeks   Status Partially Met   PT LONG TERM GOAL #2   Title Patient will be able to walk for 30 min for endurance and report min pain in LEs, relieved by rest.    Baseline mod    Time 8   Period Weeks   Status Achieved   PT LONG TERM GOAL #4   Title Pt will report being able to feel  less dyspnea on exertion with basic ADLs (improved 25% )   Time 8   Period Weeks   Status On-going   PT LONG TERM GOAL #5   Title Berg score will improve to 47/56 to demo decreased fall risk   Time 8   Period Weeks   Status On-going  Plan - 09/04/15 1446    Clinical Impression Statement Pt presents with no pain at present, but reports intermittent hip/groin pain with increased standing and ADL's at home. Pt reports pain levels at 3-4/10. Pt arriving using his straight cane with decreased stabiltiy with dynamic balance. Pt arriving with his tennis shoes from home, but reports going next week with his son to [employer] to purchase a new pair that wiill provide better support with a wider toe box and either velcro straps or buncy cord laces to allow for better ease of donning. Pt reported not feeling well at the strat of treatment and vitals were taken. Following exercises pt's O2 sats decreased to 91-92% on RA. Pt was instructed in pursed lip deep breathing exercises and his saturation rate increased back up to 98%. At rest pt's o2 sats were 95-96% prior to therapy. Pt's heart rate stable ranging from 71-82 bpm during session when monitored using a pulse ox. Overall pt tolerated treatment well today with several rest breaks required between each exercise.    Rehab Potential Good   PT Frequency 2x / week   PT Duration 8 weeks   PT Treatment/Interventions ADLs/Self Care Home Management;Cryotherapy;Electrical Stimulation;Moist Heat;Ultrasound;Patient/family education;Passive range of motion;Neuromuscular re-education;Balance training;Therapeutic exercise;Therapeutic activities;Manual techniques;Functional mobility training;Gait training   PT Next Visit Plan Continue to work balance, gait and flexion based core, hip strengtheing   PT Home Exercise Plan Pt to continue his current HEP   Consulted and Agree with Plan of Care Patient      Patient will benefit from skilled  therapeutic intervention in order to improve the following deficits and impairments:  Abnormal gait, Decreased strength, Improper body mechanics, Postural dysfunction, Difficulty walking, Decreased mobility, Impaired flexibility, Impaired sensation, Decreased range of motion, Decreased balance, Pain, Increased fascial restricitons, Decreased endurance  Visit Diagnosis: Abnormal posture  Pain In Left Leg  Pain In Right Leg  Other symptoms and signs involving the musculoskeletal system  Gait instability  Decreased functional mobility and endurance     Problem List There are no active problems to display for this patient.   Oretha Caprice MPT 09/04/2015, 3:08 PM  [facility] [address] [place], [place], [address] Phone: [phone]   Fax:  [phone]  Name: Brian [name] MRN: [id number] Date of Birth: 1931-12-26

## 2015-09-06 ENCOUNTER — Ambulatory Visit: Payer: Medicare Other | Admitting: Physical Therapy

## 2015-09-06 DIAGNOSIS — R2689 Other abnormalities of gait and mobility: Secondary | ICD-10-CM

## 2015-09-06 DIAGNOSIS — R293 Abnormal posture: Secondary | ICD-10-CM | POA: Diagnosis not present

## 2015-09-06 DIAGNOSIS — M79604 Pain in right leg: Secondary | ICD-10-CM

## 2015-09-06 DIAGNOSIS — M79605 Pain in left leg: Secondary | ICD-10-CM

## 2015-09-06 DIAGNOSIS — Z7409 Other reduced mobility: Secondary | ICD-10-CM

## 2015-09-06 NOTE — Patient Instructions (Addendum)
Ankle Bend (Dorsiflexion and Plantar Flexion)    Sitting knees bent to 90 deg. Point toes up, hold 5 sec  keeping both heels on floor, repeat 20-30 times.    Then press toes to floor, raising heels up as high as you can, hold 5 sec and repeat 20-30 times.     Do ___2-3  sessions per day or when you are sitting and remember to do it!   This will help you pick your feet up when you walk and help maintain your balance.    http://gt2.exer.us/403   Copyright  VHI. All rights reserved.

## 2015-09-06 NOTE — Therapy (Signed)
[place], [place], [address] Phone: [phone]   Fax:  [phone]  Physical Therapy Treatment  Patient Details  Name: Brian [name] MRN: [id number] Date of Birth: 01/19/1932 Referring Provider: Benjamine Sprague  Encounter Date: 09/06/2015      PT End of Session - 09/06/15 1339    Visit Number 19   Number of Visits 25   Date for PT Re-Evaluation 09/24/15   Authorization Type GCODE at 19 visit   PT Start Time 1102   PT Stop Time 1235   PT Time Calculation (min) 50 min   Equipment Utilized During Treatment Gait belt   Activity Tolerance Patient tolerated treatment well;Patient limited by fatigue   Behavior During Therapy [facility] for tasks assessed/performed      No past medical history on file.  No past surgical history on file.  There were no vitals filed for this visit.      Subjective Assessment - 09/06/15 1147    Subjective Pt reporting 5/10 pain in bilateral hips yesterday. Pt reporting no pain today upon arrival. Pt reporting fatigue after walking up the walkway to enter the clinic. Pt also profusely sweating after walking from his car to clinic. Pt reports doing his HEP.    Patient is accompained by: Family member   Pertinent History DM, HTN, Neuropathy, Unicompartmental knee replacement    Limitations Standing;Walking;House hold activities;Lifting   How long can you sit comfortably? as needed although >2 hours gets uncomfortable in the car    How long can you stand comfortably? 10 minutes   Patient Stated Goals feel better, get legs stronger   Currently in Pain? No/denies   Pain Score 0-No pain   Aggravating Factors  Pt reports moving boxes yesterday and increased hip pain in bilateral hips of 5/10 yesterday.    Pain Relieving Factors resting, heat   Multiple Pain Sites No                         [facility] Adult PT Treatment/Exercise - 09/06/15 0001    Ambulation/Gait   Ambulation/Gait Yes   Ambulation/Gait Assistance 5: Supervision;4: Min guard   Ambulation/Gait Assistance Details Pt required Supervision to [place] with amb, working on turns with no device.    Ambulation Distance (Feet) 300 Feet   Assistive device --  no device   Gait Pattern Step-through pattern   Ambulation Surface Unlevel;Indoor  Pt had to navigate obstacles in our therapy gym with CGA   Gait Comments pt also worked on amb in parallel bars with CGA. Pt also working on tandum gait with CGA to min assist in parallel bars using mirror and taped line on the floor for feedback.     Posture/Postural Control   Postural Limitations Rounded Shoulders;Forward head;Increased thoracic kyphosis;Anterior pelvic tilt;Flexed trunk   Posture Comments Pt instructed in posture correction  throughout session. Pt neede verbal and tactile cuting for full hip extension when standing.    High Level Balance   High Level Balance Activities Head turns  and trunk rotation with standing with SBA in front of mat   High Level Balance Comments Forward stepping x 10 on Rt and Left LE, Alternating forward stepping x 10 each LE. Weight shifting side to side while standing. Pt with posterior lean and several LOB where he required mininum assist to recover using the gait belt. Pt required instructions for keeping his weight forward and centered when stepping.  Self-Care   Self-Care Other Self-Care Comments   Other Self-Care Comments  Instructed pt to take lasix provided by his primary care physician to take prn as needed. Pt also encouraged to elevate bilateral LE's to help decreased swelling. Pt's PCP called  and  given an update on pt's status. Pt also encouraged to perform pursed lip breathing and rest when fatigued .  Pt was also instructed to perform Ankle Pumps for improved circulation.    Knee/Hip Exercises: Standing   Hip Abduction Both;1 set;10 reps   Hip Extension Both;1 set;10 reps   Other Standing Knee Exercises  calf raises with upper extremity support. All standing exercises were performed with UE support.                 PT Education - 09/06/15 1234    Education provided Yes   Education Details Pt instructed in df and pf exercises in seated and also instructed he can perform as he elevates his LE for increased edema. Pt instructed to use his straight cane for home amb for increased safety and balance. Continue HEP as tolerated. Pt was noted with bilateral LE pitting edema and pt instructed  to take his prn lasix as directed by his PCP.    Person(s) Educated Patient   Methods Explanation;Demonstration   Comprehension Verbalized understanding;Returned demonstration          PT Short Term Goals - 09/04/15 1455    PT SHORT TERM GOAL #1   Title Patient will be I with HEP   Time 4   Period Weeks   Status Achieved   PT SHORT TERM GOAL #2   Title Patient will complete balance screen and PT will set goal if needed   Time 2   Period Weeks   Status Achieved   PT SHORT TERM GOAL #3   Title Patient will demo and understand good body mechanics for basic mobility tasks.    Time 4   Period Weeks   Status Achieved           PT Long Term Goals - 09/06/15 1356    PT LONG TERM GOAL #1   Title Patient will be able to stand for basic home tasks and report only min occasional pain in LEs   Baseline can do 15 min, pain no more than a 3/10.    Time 8   Period Weeks   Status Partially Met   PT LONG TERM GOAL #2   Title Patient will be able to walk for 30 min for endurance and report min pain in LEs, relieved by rest.    Baseline mod    Time 8   Period Weeks   Status Achieved   PT LONG TERM GOAL #3   Title Pt will perform sit to stand x 10 without UE assist in a reasonable amt of time to demo improved functional strength   Baseline 20 inches height    Time 8   Period Weeks   Status Achieved   PT LONG TERM GOAL #4   Title Pt will report being able to feel less dyspnea on exertion with  basic ADLs (improved 25% )   Time 8   Status On-going   PT LONG TERM GOAL #5   Title Berg score will improve to 47/56 to demo decreased fall risk   Time 8   Period Weeks   Status On-going               Plan -  2015/09/11 1343    Clinical Impression Statement Pt presents with no pain at present but reports hip pain 5/10 after yesterdays activities of moving boxes of video tapes at home. Pt amb into clinic with straight cane with decreased bilateral foot clearance intermittently. Pt presenting fatigued after amb from his car to the clinic building and into the gym area. Pt's HR upon arrival was 92bpm, O2 sats were 96% on RA. Pt was allowed a sitting rest break prior to therapy. Pt following all commands and has met all of his STG's set at initial evaluation, Pt still progressing toward his LTG's  #1, #4, and #5.  Pt still reporting he is waiting on his son to take him to purchase a better fitting shoe for added stability and support.  After performing standing LE's strengtheing exercises, it was noted decreased strenght in bilateral  df and pf. During MMT it was noted that pt presented with bilateral pitting edema  of +2. Pt  was instructed in LE elevation and added DF/ PF exercises to his HEP.   Pt reporting his PCP had  prescribed lasix prn for swelling.  Pt was insructed to go home and take a lasix as directed by Dr. Si Raider . Dr. Marcia Brash office was called and updated on pt's current status. The office reported they would follow up with  a phone call to set up an appointment with the pt.    Rehab Potential Good   PT Frequency 2x / week   PT Duration 8 weeks   PT Treatment/Interventions ADLs/Self Care Home Management;Cryotherapy;Electrical Stimulation;Moist Heat;Ultrasound;Patient/family education;Passive range of motion;Neuromuscular re-education;Balance training;Therapeutic exercise;Therapeutic activities;Manual techniques;Functional mobility training;Gait training   PT Next Visit Plan  Continue to work balance, gait,  hip strengtheing, and bilateral ankle strengthening   PT Home Exercise Plan Pt to continue his current HEP   Consulted and Agree with Plan of Care Patient      Patient will benefit from skilled therapeutic intervention in order to improve the following deficits and impairments:     Visit Diagnosis: Gait instability  Other abnormalities of gait and mobility  Pain In Left Leg  Pain In Right Leg  Decreased functional mobility and endurance       G-Codes - 2015-09-11 1358    Functional Assessment Tool Used clinical judgment    Functional Limitation Mobility: Walking and moving around   Mobility: Walking and Moving Around Current Status (O1410) At least 40 percent but less than 60 percent impaired, limited or restricted   Mobility: Walking and Moving Around Goal Status [phone]) At least 20 percent but less than 40 percent impaired, limited or restricted      Problem List There are no active problems to display for this patient.   Oretha Caprice, MPT 2015/09/11, 2:23 PM  [facility] [address] [place], [place], [address] Phone: [phone]   Fax:  [phone]  Name: Brian [name] MRN: [id number] Date of Birth: 1931/09/01

## 2015-09-11 ENCOUNTER — Ambulatory Visit: Payer: Medicare Other | Admitting: Physical Therapy

## 2015-09-11 DIAGNOSIS — R293 Abnormal posture: Secondary | ICD-10-CM | POA: Diagnosis not present

## 2015-09-11 DIAGNOSIS — M79604 Pain in right leg: Secondary | ICD-10-CM

## 2015-09-11 DIAGNOSIS — M79605 Pain in left leg: Secondary | ICD-10-CM

## 2015-09-11 DIAGNOSIS — R29898 Other symptoms and signs involving the musculoskeletal system: Secondary | ICD-10-CM

## 2015-09-11 DIAGNOSIS — R2689 Other abnormalities of gait and mobility: Secondary | ICD-10-CM

## 2015-09-11 DIAGNOSIS — Z7409 Other reduced mobility: Secondary | ICD-10-CM

## 2015-09-11 NOTE — Therapy (Signed)
[place], [place], [address] Phone: [phone]   Fax:  [phone]  Physical Therapy Treatment  Patient Details  Name: Brian [name] MRN: [id number] Date of Birth: 08-01-1931 Referring Provider: Benjamine Sprague  Encounter Date: 09/11/2015      PT End of Session - 09/11/15 1154    Visit Number 20   Number of Visits 25   Date for PT Re-Evaluation 09/24/15   PT Start Time 1150   PT Stop Time 1230   PT Time Calculation (min) 40 min   Activity Tolerance Patient tolerated treatment well   Behavior During Therapy [facility] for tasks assessed/performed      No past medical history on file.  No past surgical history on file.  There were no vitals filed for this visit.      Subjective Assessment - 09/11/15 1152    Subjective Had a busy weekend.  Sore on both hips.  Was able to stand and do part of an outdoor wedding on Sunday. MD put me on Lasix and Calcium and will follow up with MD and possibly cardiologist in the near future.     Currently in Pain? Yes   Pain Score 3    Pain Location Hip   Pain Orientation Left;Right;Posterior   Pain Descriptors / Indicators Aching   Pain Type Chronic pain   Pain Onset More than a month ago   Pain Frequency Intermittent   Aggravating Factors  overactivity and spinal extension, correction of spine    Pain Relieving Factors rest, sitting,heat             [facility] Adult PT Treatment/Exercise - 09/11/15 1158    Lumbar Exercises: Stretches   Single Knee to Chest Stretch 3 reps;30 seconds   Lower Trunk Rotation Limitations x 10   1 set with legs on ball for oblique twist gentle    Lumbar Exercises: Aerobic   Stationary Bike Nu Step UE and LE level 5, 8 min for endurance    Lumbar Exercises: Standing   Row Strengthening;Both;20 reps   Theraband Level (Row) Level 4 (Blue)   Shoulder Extension Strengthening;Both;20 reps   Theraband Level (Shoulder Extension) Level 3  (Green)   Lumbar Exercises: Supine   Ab Set 5 reps   AB Set Limitations notable hernia, spent time working on Transverse Abd contraction and posterior pelvic tilt   Heel Slides 20 reps   Heel Slides Limitations with ball    Bridge 10 reps   Bridge Limitations legs on ball    Lumbar Exercises: Sidelying   Hip Abduction 10 reps            PT Short Term Goals - 09/04/15 1455    PT SHORT TERM GOAL #1   Title Patient will be I with HEP   Time 4   Period Weeks   Status Achieved   PT SHORT TERM GOAL #2   Title Patient will complete balance screen and PT will set goal if needed   Time 2   Period Weeks   Status Achieved   PT SHORT TERM GOAL #3   Title Patient will demo and understand good body mechanics for basic mobility tasks.    Time 4   Period Weeks   Status Achieved           PT Long Term Goals - 09/06/15 1356    PT LONG TERM GOAL #1   Title Patient will be able to stand for basic home  tasks and report only min occasional pain in LEs   Baseline can do 15 min, pain no more than a 3/10.    Time 8   Period Weeks   Status Partially Met   PT LONG TERM GOAL #2   Title Patient will be able to walk for 30 min for endurance and report min pain in LEs, relieved by rest.    Baseline mod    Time 8   Period Weeks   Status Achieved   PT LONG TERM GOAL #3   Title Pt will perform sit to stand x 10 without UE assist in a reasonable amt of time to demo improved functional strength   Baseline 20 inches height    Time 8   Period Weeks   Status Achieved   PT LONG TERM GOAL #4   Title Pt will report being able to feel less dyspnea on exertion with basic ADLs (improved 25% )   Time 8   Status On-going   PT LONG TERM GOAL #5   Title Berg score will improve to 47/56 to demo decreased fall risk   Time 8   Period Weeks   Status On-going               Plan - 09/11/15 1246    Clinical Impression Statement Pt appeared less fatigued, swelling improved since on Lasix.   Modified session to include more mat exercises, stretching.  Pt with bulge in rectus abdominus/umbilical hernia noted with any abdominal work today.  No pain, patient to mention this to MD when he goes for a MD follow up.  Taught him to flatten abdominals for TrA contraction but had difficulty.  No pain increase today.     PT Next Visit Plan Continue to work balance, gait,  hip strengtheing, and bilateral ankle strengthening   PT Home Exercise Plan Pt to continue his current HEP   Consulted and Agree with Plan of Care Patient      Patient will benefit from skilled therapeutic intervention in order to improve the following deficits and impairments:  Abnormal gait, Decreased strength, Improper body mechanics, Postural dysfunction, Difficulty walking, Decreased mobility, Impaired flexibility, Impaired sensation, Decreased range of motion, Decreased balance, Pain, Increased fascial restricitons, Decreased endurance  Visit Diagnosis: Other abnormalities of gait and mobility  Pain In Left Leg  Pain In Right Leg  Decreased functional mobility and endurance  Abnormal posture  Other symptoms and signs involving the musculoskeletal system     Problem List There are no active problems to display for this patient.   Nathalya Wolanski 09/11/2015, 12:53 PM  [facility] [address] [place], [place], [address] Phone: [phone]   Fax:  [phone]  Name: Brian [name] MRN: [id number] Date of Birth: 01-11-1932   Raeford Razor, PT 09/11/2015 12:53 PM Phone: [phone] Fax: [phone]

## 2015-09-13 ENCOUNTER — Encounter: Payer: Medicare Other | Admitting: Physical Therapy

## 2015-09-13 ENCOUNTER — Ambulatory Visit: Payer: Medicare Other | Admitting: Physical Therapy

## 2015-09-13 DIAGNOSIS — M79604 Pain in right leg: Secondary | ICD-10-CM

## 2015-09-13 DIAGNOSIS — M79605 Pain in left leg: Secondary | ICD-10-CM

## 2015-09-13 DIAGNOSIS — R2689 Other abnormalities of gait and mobility: Secondary | ICD-10-CM

## 2015-09-13 DIAGNOSIS — R29898 Other symptoms and signs involving the musculoskeletal system: Secondary | ICD-10-CM

## 2015-09-13 DIAGNOSIS — R293 Abnormal posture: Secondary | ICD-10-CM

## 2015-09-13 DIAGNOSIS — Z7409 Other reduced mobility: Secondary | ICD-10-CM

## 2015-09-14 NOTE — Therapy (Signed)
[place], [place], [address] Phone: [phone]   Fax:  [phone]  Physical Therapy Treatment  Patient Details  Name: Brian [name] MRN: [id number] Date of Birth: 07-19-1931 Referring Provider: Benjamine Sprague  Encounter Date: 09/13/2015      PT End of Session - 09/14/15 0558    Visit Number 21   Number of Visits 25   Date for PT Re-Evaluation 09/24/15   Authorization Type GCODE at 19 visit   PT Start Time 1416   PT Stop Time 1510   PT Time Calculation (min) 54 min   Equipment Utilized During Treatment Gait belt   Activity Tolerance Patient tolerated treatment well   Behavior During Therapy [facility] for tasks assessed/performed      No past medical history on file.  No past surgical history on file.  There were no vitals filed for this visit.      Subjective Assessment - 09/13/15 1419    Subjective Pain is about a 2/10-3/10. No changes, really.    Currently in Pain? Yes   Pain Score 3   same as last visit           [place] Adult PT Treatment/Exercise - 09/13/15 1425    High Level Balance   High Level Balance Comments sidestepping, forward lunge, narrow BOS and toe walking in parallel bars    Lumbar Exercises: Stretches   Single Knee to Chest Stretch 3 reps;30 seconds   Lumbar Exercises: Standing   Heel Raises 20 reps   Forward Lunge 10 reps   Forward Lunge Limitations each LE in paralel bars   Lumbar Exercises: Supine   Bridge 10 reps   Knee/Hip Exercises: Standing   Forward Step Up Both;1 set;20 reps;Hand Hold: 2   Shoulder Exercises: Standing   Horizontal ABduction Strengthening;Both;12 reps;Theraband   Theraband Level (Shoulder Horizontal ABduction) Level 2 (Red)   Horizontal ABduction Weight (lbs) 5 sec hold    External Rotation Strengthening;Both;12 reps;Theraband   Theraband Level (Shoulder External Rotation) Level 2 (Red)   Other Standing Exercises diagonal pull red x 10    Moist Heat Therapy   Number Minutes Moist Heat 10 Minutes   Moist Heat Location Lumbar Spine   Manual Therapy   Passive ROM bilateral hips                 PT Education - 09/14/15 0557    Education provided Yes   Education Details base of support, posture, knee control    Person(s) Educated Patient   Methods Explanation;Demonstration   Comprehension Verbalized understanding;Returned demonstration          PT Short Term Goals - 09/04/15 1455    PT SHORT TERM GOAL #1   Title Patient will be I with HEP   Time 4   Period Weeks   Status Achieved   PT SHORT TERM GOAL #2   Title Patient will complete balance screen and PT will set goal if needed   Time 2   Period Weeks   Status Achieved   PT SHORT TERM GOAL #3   Title Patient will demo and understand good body mechanics for basic mobility tasks.    Time 4   Period Weeks   Status Achieved           PT Long Term Goals - 09/06/15 1356    PT LONG TERM GOAL #1   Title Patient will be able to stand for basic home tasks and report  only min occasional pain in LEs   Baseline can do 15 min, pain no more than a 3/10.    Time 8   Period Weeks   Status Partially Met   PT LONG TERM GOAL #2   Title Patient will be able to walk for 30 min for endurance and report min pain in LEs, relieved by rest.    Baseline mod    Time 8   Period Weeks   Status Achieved   PT LONG TERM GOAL #3   Title Pt will perform sit to stand x 10 without UE assist in a reasonable amt of time to demo improved functional strength   Baseline 20 inches height    Time 8   Period Weeks   Status Achieved   PT LONG TERM GOAL #4   Title Pt will report being able to feel less dyspnea on exertion with basic ADLs (improved 25% )   Time 8   Status On-going   PT LONG TERM GOAL #5   Title Berg score will improve to 47/56 to demo decreased fall risk   Time 8   Period Weeks   Status On-going               Plan - 09/14/15 0558    Clinical  Impression Statement Worked in standing today most of session.  Cont to have difficulty with using UE and maintaining balance unsupported with a normal BOS.  Sways posteriorly.  He continues to show dyspnea on exertion, sweating but recovers with seated rest. Min improvement per patient of fatigue.     PT Next Visit Plan Continue to work balance, gait,  hip strengtheing, and bilateral ankle strengthening, begin prep for DC- walking program , Post PT group?    PT Home Exercise Plan Pt to continue his current HEP   Consulted and Agree with Plan of Care Patient      Patient will benefit from skilled therapeutic intervention in order to improve the following deficits and impairments:  Abnormal gait, Decreased strength, Improper body mechanics, Postural dysfunction, Difficulty walking, Decreased mobility, Impaired flexibility, Impaired sensation, Decreased range of motion, Decreased balance, Pain, Increased fascial restricitons, Decreased endurance  Visit Diagnosis: Other abnormalities of gait and mobility  Pain In Left Leg  Pain In Right Leg  Decreased functional mobility and endurance  Abnormal posture  Other symptoms and signs involving the musculoskeletal system     Problem List There are no active problems to display for this patient.   PAA,JENNIFER 09/14/2015, 6:07 AM  [place], [place], [address] Phone: [phone]   Fax:  [phone]  Name: TACUMA GRAFFAM MRN: [id number] Date of Birth: 09/03/1931    Raeford Razor, PT 09/14/2015 6:07 AM Phone: [phone] Fax: [phone]

## 2015-09-18 ENCOUNTER — Ambulatory Visit: Payer: Medicare Other | Admitting: Physical Therapy

## 2015-09-18 DIAGNOSIS — R2689 Other abnormalities of gait and mobility: Secondary | ICD-10-CM

## 2015-09-18 DIAGNOSIS — R293 Abnormal posture: Secondary | ICD-10-CM

## 2015-09-18 DIAGNOSIS — M79604 Pain in right leg: Secondary | ICD-10-CM

## 2015-09-18 DIAGNOSIS — M79605 Pain in left leg: Secondary | ICD-10-CM

## 2015-09-18 NOTE — Therapy (Signed)
[place], [place], [address] Phone: [phone]   Fax:  [phone]  Physical Therapy Treatment  Patient Details  Name: Brian [name] MRN: [id number] Date of Birth: May 18, 1931 Referring Provider: Benjamine Sprague  Encounter Date: 09/18/2015      PT End of Session - 09/18/15 1244    Visit Number 22   Number of Visits 25   Date for PT Re-Evaluation 09/24/15   Authorization Type GCODE at 19 visit   PT Start Time 1148   PT Stop Time 1234   PT Time Calculation (min) 46 min   Equipment Utilized During Treatment Gait belt   Activity Tolerance Patient tolerated treatment well   Behavior During Therapy [facility] for tasks assessed/performed      No past medical history on file.  No past surgical history on file.  There were no vitals filed for this visit.      Subjective Assessment - 09/18/15 1156    Subjective No pain today, feeling pretty good. Tired from the weekend.  DId the sermon on Sunday, a little tired.    Currently in Pain? No/denies          [facility] Adult PT Treatment/Exercise - 09/18/15 1159    Lumbar Exercises: Stretches   Single Knee to Chest Stretch 3 reps;30 seconds   Lower Trunk Rotation Limitations x 10   1 set with legs on ball for oblique twist gentle    Lumbar Exercises: Aerobic   Stationary Bike NuStep L6, 6 min with UE and LE    Lumbar Exercises: Supine   Glut Set 20 reps   Lumbar Exercises: Prone   Straight Leg Raise 10 reps   Other Prone Lumbar Exercises knee flexion x 10    Knee/Hip Exercises: Standing   Hip Abduction Both;1 set;10 reps   Other Standing Knee Exercises Worked on balance exercises with UE weights: bicep curls, abduction and horiz abd x 10-15 reps, min guard A .     Shoulder Exercises: Seated   Horizontal ABduction Strengthening;Both;15 reps;Theraband   Theraband Level (Shoulder Horizontal ABduction) Level 3 Nyoka Cowden)                PT Education -  09/18/15 1243    Education provided Yes   Education Details post PT program    Person(s) Educated Patient   Methods Explanation;Handout   Comprehension Verbalized understanding          PT Short Term Goals - 09/04/15 1455    PT SHORT TERM GOAL #1   Title Patient will be I with HEP   Time 4   Period Weeks   Status Achieved   PT SHORT TERM GOAL #2   Title Patient will complete balance screen and PT will set goal if needed   Time 2   Period Weeks   Status Achieved   PT SHORT TERM GOAL #3   Title Patient will demo and understand good body mechanics for basic mobility tasks.    Time 4   Period Weeks   Status Achieved           PT Long Term Goals - 09/06/15 1356    PT LONG TERM GOAL #1   Title Patient will be able to stand for basic home tasks and report only min occasional pain in LEs   Baseline can do 15 min, pain no more than a 3/10.    Time 8   Period Weeks   Status Partially Met  PT LONG TERM GOAL #2   Title Patient will be able to walk for 30 min for endurance and report min pain in LEs, relieved by rest.    Baseline mod    Time 8   Period Weeks   Status Achieved   PT LONG TERM GOAL #3   Title Pt will perform sit to stand x 10 without UE assist in a reasonable amt of time to demo improved functional strength   Baseline 20 inches height    Time 8   Period Weeks   Status Achieved   PT LONG TERM GOAL #4   Title Pt will report being able to feel less dyspnea on exertion with basic ADLs (improved 25% )   Time 8   Status On-going   PT LONG TERM GOAL #5   Title Berg score will improve to 47/56 to demo decreased fall risk   Time 8   Period Weeks   Status On-going               Plan - 09/18/15 1244    Clinical Impression Statement Began to prepare pt for DC plan of recommended HEP and transition to independence vs post PT group.  He will consider it.  Worked on balance, cont to need close supervision when no UE support.     PT Next Visit Plan Continue  to work balance, gait,  hip strengtheing, and bilateral ankle strengthening, begin prep for DC- walking program , Post PT group?    PT Home Exercise Plan Pt to continue his current HEP   Consulted and Agree with Plan of Care Patient      Patient will benefit from skilled therapeutic intervention in order to improve the following deficits and impairments:  Abnormal gait, Decreased strength, Improper body mechanics, Postural dysfunction, Difficulty walking, Decreased mobility, Impaired flexibility, Impaired sensation, Decreased range of motion, Decreased balance, Pain, Increased fascial restricitons, Decreased endurance  Visit Diagnosis: Other abnormalities of gait and mobility  Pain In Left Leg  Pain In Right Leg  Abnormal posture     Problem List There are no active problems to display for this patient.   PAA,JENNIFER 09/18/2015, 12:47 PM  [facility] [address] [place], [place], [address] Phone: [phone]   Fax:  [phone]  Name: Brian [name] MRN: [id number] Date of Birth: [birth year]    Raeford Razor, PT 09/18/2015 12:48 PM Phone: [phone] Fax: [phone]

## 2015-09-20 ENCOUNTER — Ambulatory Visit: Payer: Medicare Other | Admitting: Physical Therapy

## 2015-09-20 DIAGNOSIS — R2689 Other abnormalities of gait and mobility: Secondary | ICD-10-CM

## 2015-09-20 DIAGNOSIS — R293 Abnormal posture: Secondary | ICD-10-CM

## 2015-09-20 DIAGNOSIS — M79605 Pain in left leg: Secondary | ICD-10-CM

## 2015-09-20 DIAGNOSIS — M79604 Pain in right leg: Secondary | ICD-10-CM

## 2015-09-20 NOTE — Therapy (Signed)
[place], [place], [address] Phone: [phone]   Fax:  [phone]  Physical Therapy Treatment  Patient Details  Name: Brian [name] MRN: [id number] Date of Birth: 25-Nov-1931 Referring Provider: Benjamine Sprague  Encounter Date: 09/20/2015      PT End of Session - 09/20/15 1158    Visit Number 23   Number of Visits 25   Date for PT Re-Evaluation 09/24/15   Authorization Type GCODE at 19 visit   PT Start Time 1150   PT Stop Time 1235   PT Time Calculation (min) 45 min   Equipment Utilized During Treatment Gait belt   Activity Tolerance Patient tolerated treatment well   Behavior During Therapy [facility] for tasks assessed/performed      No past medical history on file.  No past surgical history on file.  There were no vitals filed for this visit.      Subjective Assessment - 09/20/15 1156    Subjective Pretty good today. I'm planning to rest this weekend. Reports it takes longer for him to feel pain in legs in standing.    Currently in Pain? No/denies           [facility] Adult PT Treatment/Exercise - 09/20/15 1200    Self-Care   Other Self-Care Comments  post PT group information    Lumbar Exercises: Stretches   Single Knee to Chest Stretch 3 reps;30 seconds   Lower Trunk Rotation Limitations x 10   1 set with legs on ball for oblique twist gentle    Lumbar Exercises: Aerobic   Stationary Bike NuStep L6, 6 min with UE and LE    Lumbar Exercises: Machines for Strengthening   Leg Press 1 plate. 2 sets x 20.  Used for calf raises as well 1 plate x 15 reps    Knee/Hip Exercises: Seated   Long Arc Quad Strengthening;Both;1 set;10 reps;Other (comment)   Long Arc Quad Limitations 10 sec hold   Knee/Hip Flexion 5 lbs march x 10 altenating    Sit to [employer] 2 sets;10 reps;without UE support   Ankle Exercises: Seated   Other Seated Ankle Exercises theraband red DF and PF x 10                  PT Education - 09/20/15 1248    Education provided Yes   Education Details post PT program, procedures, payment, etc   Person(s) Educated Patient   Methods Explanation   Comprehension Verbalized understanding;Returned demonstration          PT Short Term Goals - 09/04/15 1455    PT SHORT TERM GOAL #1   Title Patient will be I with HEP   Time 4   Period Weeks   Status Achieved   PT SHORT TERM GOAL #2   Title Patient will complete balance screen and PT will set goal if needed   Time 2   Period Weeks   Status Achieved   PT SHORT TERM GOAL #3   Title Patient will demo and understand good body mechanics for basic mobility tasks.    Time 4   Period Weeks   Status Achieved           PT Long Term Goals - 09/06/15 1356    PT LONG TERM GOAL #1   Title Patient will be able to stand for basic home tasks and report only min occasional pain in LEs   Baseline can do 15 min, pain no  more than a 3/10.    Time 8   Period Weeks   Status Partially Met   PT LONG TERM GOAL #2   Title Patient will be able to walk for 30 min for endurance and report min pain in LEs, relieved by rest.    Baseline mod    Time 8   Period Weeks   Status Achieved   PT LONG TERM GOAL #3   Title Pt will perform sit to stand x 10 without UE assist in a reasonable amt of time to demo improved functional strength   Baseline 20 inches height    Time 8   Period Weeks   Status Achieved   PT LONG TERM GOAL #4   Title Pt will report being able to feel less dyspnea on exertion with basic ADLs (improved 25% )   Time 8   Status On-going   PT LONG TERM GOAL #5   Title Berg score will improve to 47/56 to demo decreased fall risk   Time 8   Period Weeks   Status On-going               Plan - 09/20/15 1249    Clinical Impression Statement Working to wrap up HEP and cont progressing LE strength, endurance.  Will come to group next Wed.    PT Next Visit Plan Continue to work balance, gait,  hip  strengtheing, and bilateral ankle strengthening, begin prep for DC- walking program , Post PT group?    PT Home Exercise Plan Pt to continue his current HEP   Consulted and Agree with Plan of Care Patient      Patient will benefit from skilled therapeutic intervention in order to improve the following deficits and impairments:  Abnormal gait, Decreased strength, Improper body mechanics, Postural dysfunction, Difficulty walking, Decreased mobility, Impaired flexibility, Impaired sensation, Decreased range of motion, Decreased balance, Pain, Increased fascial restricitons, Decreased endurance  Visit Diagnosis: Other abnormalities of gait and mobility  Pain In Left Leg  Pain In Right Leg  Abnormal posture     Problem List There are no active problems to display for this patient.   Lissette Schenk 09/20/2015, 12:52 PM  [facility] [address] [place], [place], [address] Phone: [phone]   Fax:  [phone]  Name: Brian [name] MRN: [id number] Date of Birth: [birth year]    Raeford Razor, PT 09/20/2015 12:52 PM Phone: [phone] Fax: [phone]

## 2015-09-24 ENCOUNTER — Ambulatory Visit: Payer: Medicare Other | Admitting: Physical Therapy

## 2015-09-26 DIAGNOSIS — M7022 Olecranon bursitis, left elbow: Secondary | ICD-10-CM | POA: Insufficient documentation

## 2015-09-27 ENCOUNTER — Ambulatory Visit: Payer: Medicare Other | Attending: Internal Medicine | Admitting: Physical Therapy

## 2015-09-27 DIAGNOSIS — R29898 Other symptoms and signs involving the musculoskeletal system: Secondary | ICD-10-CM

## 2015-09-27 DIAGNOSIS — Z7409 Other reduced mobility: Secondary | ICD-10-CM | POA: Diagnosis present

## 2015-09-27 DIAGNOSIS — R293 Abnormal posture: Secondary | ICD-10-CM

## 2015-09-27 DIAGNOSIS — M79605 Pain in left leg: Secondary | ICD-10-CM

## 2015-09-27 DIAGNOSIS — M79604 Pain in right leg: Secondary | ICD-10-CM

## 2015-09-27 DIAGNOSIS — R2689 Other abnormalities of gait and mobility: Secondary | ICD-10-CM

## 2015-09-27 NOTE — Addendum Note (Signed)
Addended by: Raeford Razor L on: 09/27/2015 01:10 PM   Modules accepted: Orders

## 2015-09-27 NOTE — Therapy (Addendum)
[place], [place], [address] Phone: [phone]   Fax:  [phone]   Physical Therapy Treatment and Discharge Patient Details  Name: Brian [name] MRN: [id number] Date of Birth: Mar 30, 1931 Referring Provider: Benjamine Sprague  Encounter Date: 09/27/2015      PT End of Session - 09/27/15 1206    Visit Number 24   Number of Visits 25   Date for PT Re-Evaluation 09/24/15   PT Start Time 1150      No past medical history on file.  No past surgical history on file.  There were no vitals filed for this visit.      Subjective Assessment - 09/27/15 1156    Subjective L elbow swelled up again.  Thats why I missed last visit.  May have it drained next week, on anti-biotics.     Currently in Pain? No/denies            [facility] PT Assessment - 09/27/15 1157    AROM   Lumbar Flexion reaches to mid shin    Lumbar Extension 80%   Lumbar - Right Side Bend 25%   Lumbar - Left Side Bend 25%   Lumbar - Right Rotation 50%   Lumbar - Left Rotation 50%   Strength   Right Hip Flexion 5/5   Left Hip Flexion 5/5   Right Knee Flexion 4+/5   Right Knee Extension 5/5   Left Knee Flexion 5/5   Left Knee Extension 5/5   Berg Balance Test   Sit to Stand Able to stand without using hands and stabilize independently   Standing Unsupported Able to stand safely 2 minutes   Sitting with Back Unsupported but Feet Supported on Floor or Stool Able to sit safely and securely 2 minutes   Stand to Sit Sits safely with minimal use of hands   Transfers Able to transfer safely, minor use of hands   Standing Unsupported with Eyes Closed Able to stand 10 seconds safely   Standing Ubsupported with Feet Together Able to place feet together independently and stand for 1 minute with supervision   From Standing, Reach Forward with Outstretched Arm Can reach forward >12 cm safely (5")   From Standing Position, Pick up Object from Floor  Able to pick up shoe safely and easily   From Standing Position, Turn to Look Behind Over each Shoulder Looks behind one side only/other side shows less weight shift   Turn 360 Degrees Able to turn 360 degrees safely one side only in 4 seconds or less   Standing Unsupported, Alternately Place Feet on Step/Stool Able to complete >2 steps/needs minimal assist   Standing Unsupported, One Foot in Front Able to take small step independently and hold 30 seconds   Standing on One Leg Tries to lift leg/unable to hold 3 seconds but remains standing independently   Total Score 44   Berg comment: 44/56             [facility] Adult PT Treatment/Exercise - 09/27/15 1224    Lumbar Exercises: Stretches   Lower Trunk Rotation Limitations x10   Piriformis Stretch 3 reps;30 seconds   Lumbar Exercises: Aerobic   Stationary Bike NuStep L 8 LE only for 5 min    Lumbar Exercises: Standing   Heel Raises 20 reps   Other Standing Lumbar Exercises march x 15 each side   Other Standing Lumbar Exercises hip abd x 20 each , cues to lessen use of momentum  PT Education - 29-Sep-2015 1206    Education provided Yes   Education Details full HEP review, progress   Person(s) Educated Patient   Methods Explanation   Comprehension Verbalized understanding;Returned demonstration          PT Short Term Goals - 09/29/2015 1212    PT SHORT TERM GOAL #1   Title Patient will be I with HEP   Status Achieved   PT SHORT TERM GOAL #2   Title Patient will complete balance screen and PT will set goal if needed   Status Achieved   PT SHORT TERM GOAL #3   Title Patient will demo and understand good body mechanics for basic mobility tasks.    Status Achieved           PT Long Term Goals - 09-29-2015 1209    PT LONG TERM GOAL #1   Title Patient will be able to stand for basic home tasks and report only min occasional pain in LEs   Status Achieved   PT LONG TERM GOAL #2   Title Patient will be able  to walk for 30 min for endurance and report min pain in LEs, relieved by rest.    Baseline can do with a walker or shopping cart    Status Partially Met   PT LONG TERM GOAL #3   Title Pt will perform sit to stand x 10 without UE assist in a reasonable amt of time to demo improved functional strength   Status Achieved   PT LONG TERM GOAL #4   Title Pt will report being able to feel less dyspnea on exertion with basic ADLs (improved 25% )   Status Partially Met   PT LONG TERM GOAL #5   Title Berg score will improve to 47/56 to demo decreased fall risk   Baseline 44/56   Status Not Met               Plan - September 29, 2015 1206    Clinical Impression Statement Plans to attend post PT group next Wed (July 19th). HE has met most of his goals.  HE contnues to experience dyspnea with min to mod exertion.  Needs cues to moderate his pace and intensity.    PT Next Visit Plan DC   PT Home Exercise Plan Pt to continue his current HEP   Consulted and Agree with Plan of Care Patient      Patient will benefit from skilled therapeutic intervention in order to improve the following deficits and impairments:  Abnormal gait, Decreased strength, Improper body mechanics, Postural dysfunction, Difficulty walking, Decreased mobility, Impaired flexibility, Impaired sensation, Decreased range of motion, Decreased balance, Pain, Increased fascial restricitons, Decreased endurance  Visit Diagnosis: Pain In Left Leg  Other abnormalities of gait and mobility  Pain In Right Leg  Abnormal posture  Decreased functional mobility and endurance  Other symptoms and signs involving the musculoskeletal system       G-Codes - 29-Sep-2015 1309    Functional Assessment Tool Used clinical judgment    Functional Limitation Mobility: Walking and moving around   Mobility: Walking and Moving Around Current Status [phone]) At least 40 percent but less than 60 percent impaired, limited or restricted   Mobility: Walking  and Moving Around Goal Status [phone]) At least 20 percent but less than 40 percent impaired, limited or restricted   Mobility: Walking and Moving Around Discharge Status [phone]) At least 40 percent but less than 60 percent impaired, limited or restricted  Problem List There are no active problems to display for this patient.   Tarra Pence 09/27/2015, 1:09 PM  [facility] [address] [place], [place], [address] Phone: [phone]   Fax:  [phone]  Name: MINAS BONSER MRN: [id number] Date of Birth: 1931/10/19    PHYSICAL THERAPY DISCHARGE SUMMARY  Visits from Start of Care: 24  Current functional level related to goals / functional outcomes: See above for goals met.     Remaining deficits: Balance, weakness in Rt LE, core and ankle PF, spine/trunk ROM, posture    Education / Equipment: HEP, posture, balance and safety, fall risk   Plan: Patient agrees to discharge.  Patient goals were partially met. Patient is being discharged due to being pleased with the current functional level.  ?????     Raeford Razor, PT 09/27/2015 1:09 PM Phone: [phone] Fax: [phone]

## 2015-10-03 DIAGNOSIS — M71129 Other infective bursitis, unspecified elbow: Secondary | ICD-10-CM | POA: Insufficient documentation

## 2015-11-23 ENCOUNTER — Telehealth: Payer: Self-pay | Admitting: *Deleted

## 2015-11-23 ENCOUNTER — Ambulatory Visit (INDEPENDENT_AMBULATORY_CARE_PROVIDER_SITE_OTHER): Payer: Medicare Other

## 2015-11-23 ENCOUNTER — Ambulatory Visit (HOSPITAL_COMMUNITY)
Admission: RE | Admit: 2015-11-23 | Discharge: 2015-11-23 | Disposition: A | Discharge status: Home / Self Care | Payer: Medicare Other | Source: Ambulatory Visit | Attending: Vascular Surgery | Admitting: Vascular Surgery

## 2015-11-23 ENCOUNTER — Ambulatory Visit (INDEPENDENT_AMBULATORY_CARE_PROVIDER_SITE_OTHER): Payer: Medicare Other | Admitting: Podiatry

## 2015-11-23 DIAGNOSIS — M7989 Other specified soft tissue disorders: Secondary | ICD-10-CM | POA: Insufficient documentation

## 2015-11-23 DIAGNOSIS — M2022 Hallux rigidus, left foot: Secondary | ICD-10-CM | POA: Diagnosis not present

## 2015-11-23 DIAGNOSIS — L02612 Cutaneous abscess of left foot: Secondary | ICD-10-CM | POA: Diagnosis not present

## 2015-11-23 DIAGNOSIS — L03032 Cellulitis of left toe: Secondary | ICD-10-CM | POA: Diagnosis not present

## 2015-11-23 DIAGNOSIS — R52 Pain, unspecified: Secondary | ICD-10-CM

## 2015-11-23 DIAGNOSIS — M2021 Hallux rigidus, right foot: Secondary | ICD-10-CM

## 2015-11-23 DIAGNOSIS — M79605 Pain in left leg: Secondary | ICD-10-CM | POA: Diagnosis not present

## 2015-11-23 DIAGNOSIS — L97521 Non-pressure chronic ulcer of other part of left foot limited to breakdown of skin: Secondary | ICD-10-CM | POA: Diagnosis not present

## 2015-11-23 MED ORDER — CEPHALEXIN 500 MG PO CAPS: 500.0000 mg | ORAL_CAPSULE | Freq: Three times a day (TID) | ORAL | 2 refills | Status: DC

## 2015-11-23 NOTE — Telephone Encounter (Addendum)
Erica states left venous doppler is negative for DVT.  I instructed Erica to release pt to go home and I would inform Dr. Jacqualyn Posey. Shirlean Mylar - [facility] states pt was to get an antibiotic.  I reviewed Medication orders and pt was to get Cephalexin, I gave the orders verbally to Lindsay.

## 2015-11-23 NOTE — Progress Notes (Signed)
le

## 2015-11-28 ENCOUNTER — Ambulatory Visit (INDEPENDENT_AMBULATORY_CARE_PROVIDER_SITE_OTHER): Payer: Medicare Other | Admitting: Podiatry

## 2015-11-28 ENCOUNTER — Encounter: Payer: Self-pay | Admitting: Podiatry

## 2015-11-28 VITALS — BP 131/66 | HR 84 | Temp 97.6°F | Resp 18

## 2015-11-28 DIAGNOSIS — L02619 Cutaneous abscess of unspecified foot: Secondary | ICD-10-CM

## 2015-11-28 DIAGNOSIS — L03119 Cellulitis of unspecified part of limb: Secondary | ICD-10-CM | POA: Diagnosis not present

## 2015-11-28 DIAGNOSIS — R600 Localized edema: Secondary | ICD-10-CM

## 2015-11-28 NOTE — Progress Notes (Signed)
   Subjective:    Patient ID: Brian [name], male    DOB: September 01, 1931, 82 y.o.   MRN: [id number]  HPI     This patient presents today for a follow-up visit after seeing Dr. Earleen Newport on 11/23/2015. No 7 not available from this visit at this time. Patient describes approximately 6 week history of swelling in the left foot. The patient does not believe that the swelling has increased significantly since the visit of 11/23/2015. Patient currently taking cephalexin 500 mg 3 times a day without any complaint from medication. Patient describes warmth and redness in the left foot occurring on 11/28/2015, however, patient notices that the redness and warmth has resolved when he presents today. A venous Doppler was ordered by Dr. Earleen Newport and the results are available  Patient is a diabetic and denies any history of ulceration, claudication or amputation  Patient's son is present in the treatment room and was present on initial visit with Dr. Earleen Newport and he confirms that the appearance of the left foot today, 11/28/2015 appears approximately the same as on the visit of 11/23/2015  Review of Systems  All other systems reviewed and are negative.      Objective:   Physical Exam   BP 131/66 Pulse 84 Respiration 18 Temperature 97.6 Fahrenheit  Patient orientated 3  Vascular: Pitting edema left lower extremity No calf tenderness bilaterally DP and PT pulses 2/4 bilaterally Capillary reflex immediate bilaterally  Neurological: Sensation to 10 g monofilament wire intact 0/5 bilaterally Vibratory sensation nonreactive bilaterally Ankle reflex equal and reactive bilaterally  Dermatological: No open skin lesions bilaterally No warmth bilaterally No erythema bilaterally  Musculoskeletal: Prominent left first MPJ with low-grade edema. Limited motion left first MPJ  Review of lower extremity venous duplex evaluation at cardiovascular imaging at [facility] dated 11/23/2015 Impression: No  evidence of deep vein thrombosis or superficial thrombophlebitis involving the left lower extremity or contralateral groin   Review of x-ray left foot dated 11/23/2015 Significant degenerative osteoarthritis left first MPJ with low-grade edema about the first MPJ Significant osteoarthritic change left first MPJ        Assessment & Plan:    Assessment: Diabetic peripheral neuropathy Venous Doppler negative for DVT or superficial thrombophlebitis Advanced osteoarthritis left first MPJ Rule out inflammatory disease such as gout versus low-grade cellulitis about the first MPJ left  Plan: Patient to the lab for CBC with differential, uric acid and sedimentation rate At this time I instructed  the patient to continue taking the cephalexin 500 mg by mouth 3 times a day as prescribed Okay to take his existing diuretic Limit standing and walking Patient instructed to present to the emergency department if he noticed any sudden increase in swelling, redness, fever, warmth  Reappoint for scheduled visit for Dr. Earleen Newport on 12/03/2015

## 2015-11-28 NOTE — Patient Instructions (Signed)
Today with your son present in the room there was obvious swelling or leg and foot that is not changed for the past 5 days The venous Doppler of 11/22/2013 demonstrated no evidence of thrombosis or clotting Continue on your antibiotics cephalexin 3 times a day Okay to maintain your diuretic If you develop any sudden change in swelling, redness, fever, warmth present to the emergency department otherwise keep appointment with Dr. Earleen Newport as scheduled in the next 5 days We will order additional lab test today   Diabetes and Foot Care Diabetes may cause you to have problems because of poor blood supply (circulation) to your feet and legs. This may cause the skin on your feet to become thinner, break easier, and heal more slowly. Your skin may become dry, and the skin may peel and crack. You may also have nerve damage in your legs and feet causing decreased feeling in them. You may not notice minor injuries to your feet that could lead to infections or more serious problems. Taking care of your feet is one of the most important things you can do for yourself.  HOME CARE INSTRUCTIONS  Wear shoes at all times, even in the house. Do not go barefoot. Bare feet are easily injured.  Check your feet daily for blisters, cuts, and redness. If you cannot see the bottom of your feet, use a mirror or ask someone for help.  Wash your feet with warm water (do not use hot water) and mild soap. Then pat your feet and the areas between your toes until they are completely dry. Do not soak your feet as this can dry your skin.  Apply a moisturizing lotion or petroleum jelly (that does not contain alcohol and is unscented) to the skin on your feet and to dry, brittle toenails. Do not apply lotion between your toes.  Trim your toenails straight across. Do not dig under them or around the cuticle. File the edges of your nails with an emery board or nail file.  Do not cut corns or calluses or try to remove them with  medicine.  Wear clean socks or stockings every day. Make sure they are not too tight. Do not wear knee-high stockings since they may decrease blood flow to your legs.  Wear shoes that fit properly and have enough cushioning. To break in new shoes, wear them for just a few hours a day. This prevents you from injuring your feet. Always look in your shoes before you put them on to be sure there are no objects inside.  Do not cross your legs. This may decrease the blood flow to your feet.  If you find a minor scrape, cut, or break in the skin on your feet, keep it and the skin around it clean and dry. These areas may be cleansed with mild soap and water. Do not cleanse the area with peroxide, alcohol, or iodine.  When you remove an adhesive bandage, be sure not to damage the skin around it.  If you have a wound, look at it several times a day to make sure it is healing.  Do not use heating pads or hot water bottles. They may burn your skin. If you have lost feeling in your feet or legs, you may not know it is happening until it is too late.  Make sure your health care provider performs a complete foot exam at least annually or more often if you have foot problems. Report any cuts, sores, or bruises to  your health care provider immediately. SEEK MEDICAL CARE IF:   You have an injury that is not healing.  You have cuts or breaks in the skin.  You have an ingrown nail.  You notice redness on your legs or feet.  You feel burning or tingling in your legs or feet.  You have pain or cramps in your legs and feet.  Your legs or feet are numb.  Your feet always feel cold. SEEK IMMEDIATE MEDICAL CARE IF:   There is increasing redness, swelling, or pain in or around a wound.  There is a red line that goes up your leg.  Pus is coming from a wound.  You develop a fever or as directed by your health care provider.  You notice a bad smell coming from an ulcer or wound.   This information is  not intended to replace advice given to you by your health care provider. Make sure you discuss any questions you have with your health care provider.   Document Released: 03/07/2000 Document Revised: 11/10/2012 Document Reviewed: 08/17/2012 Elsevier Interactive Patient Education [employer].

## 2015-11-29 ENCOUNTER — Telehealth: Payer: Self-pay | Admitting: *Deleted

## 2015-11-29 DIAGNOSIS — R609 Edema, unspecified: Secondary | ICD-10-CM

## 2015-11-29 LAB — CBC WITH DIFFERENTIAL/PLATELET
BASOS PCT: 0 %
Basophils Absolute: 0 cells/uL (ref 0–200)
EOS ABS: 59 {cells}/uL (ref 15–500)
Eosinophils Relative: 1 %
HEMATOCRIT: 43.2 % (ref 38.5–50.0)
Hemoglobin: 14.1 g/dL (ref 13.2–17.1)
LYMPHS PCT: 22 %
Lymphs Abs: 1298 cells/uL (ref 850–3900)
MCH: 31.8 pg (ref 27.0–33.0)
MCHC: 32.6 g/dL (ref 32.0–36.0)
MCV: 97.3 fL (ref 80.0–100.0)
MONO ABS: 472 {cells}/uL (ref 200–950)
MONOS PCT: 8 %
MPV: 10.4 fL (ref 7.5–12.5)
NEUTROS ABS: 4071 {cells}/uL (ref 1500–7800)
Neutrophils Relative %: 69 %
PLATELETS: 267 10*3/uL (ref 140–400)
RBC: 4.44 MIL/uL (ref 4.20–5.80)
RDW: 13.2 % (ref 11.0–15.0)
WBC: 5.9 10*3/uL (ref 3.8–10.8)

## 2015-11-29 LAB — URIC ACID: URIC ACID, SERUM: 5.9 mg/dL (ref 4.0–8.0)

## 2015-11-29 NOTE — Telephone Encounter (Signed)
Patient referred to [facility] for vascular consult. Patient has pitting edema, diabetic and negative venous doppler. Faxed over order and pt info to facility.

## 2015-11-29 NOTE — Progress Notes (Signed)
Subjective:     Patient ID: Brian [name], male   DOB: [birth year], 82 y.o.   MRN: [id number]  HPI 82 year old male presents to the office today for concerns of a "bunion' on his left big toe which has been ongoing for years but it has been worsening. He is a callus on top of the "bunion". Areas painful shoe gear and pressure. Denies any drainage or pus coming from the area. He has of the area does swell occasionally. He said no recent treatment for this. No other complaints at this time.  Review of Systems  All other systems reviewed and are negative.      Objective:   Physical Exam General: AAO x3, NAD  Dermatological:  On the dorsal aspect of left first MTPJ overlying a bony exostosis is a hyperkeratotic lesion. Upon debridement there was an underlying ulceration measuring approximately 0.4 x 0.3 cm with a fibrotic wound base. There is no probing to bone although close. There is no undermining or tunneling. There is surrounding erythema to the dorsal at the first MTPJ without any ascending synovitis. There is no fluctuance or crepitus there is no malodor. There are no other open lesions identified at this time.  Vascular: Dorsalis Pedis artery and Posterior Tibial artery pedal pulses  Palpable with immedate capillary fill time.  There is swelling to the left leg without any pain with calf compression. There is no erythema or increase in warmth to the leg.  Neruologic: Grossly intact via light touch bilateral. Vibratory intact via tuning fork bilateral. Protective threshold with Semmes Wienstein monofilament intact to all pedal sites bilateral.   Musculoskeletal: there is minimal range of motion of the first MTPJ on the right side however there is absent range of motion of the first MTPJ and the left side. There is a large bony exostosis present at the dorsal aspect of the first MTPJ and the left side the overlying ulceration. There is no other areas of tenderness bilaterally.  Gait:  Unassisted, Nonantalgic.      Assessment:     hallux rigidus, bony exostosis of the left first MTPJ ulceration with surrounding cellulitis    Plan:     -Treatment options discussed including all alternatives, risks, and complications -Etiology of symptoms were discussed -X-rays were obtained and reviewed with the patient. Significant arthritic changes present bilateral first MTPJ's left side worse than the right with a dorsal osteophyte in the first MTPJ and the left side. -Hyperkeratotic lesion, ulceration left side which is debrided today without complications. Due to the synovitis will start Keflex. This was prescribed today. -Continue with daily dressing changes.For now can use antibiotic ointment and a bandage daily. Offloading pads were dispensed. -I discussed in the future once the infection resolves possible surgical intervention to remove the osteophyte and possible surgery for the first MPJ however we will hold off on that for now. I also discussed shoe gear modifications -Venous duplex left leg to rule out DVT -Monitor for signs or symptoms of worsening infection and directed to call the office medially should any occur or go to the ER. -Follow-up as scheduled or sooner if needed.   Celesta Gentile, DPM

## 2015-11-30 LAB — SEDIMENTATION RATE: Sed Rate: 4 mm/hr (ref 0–20)

## 2015-12-03 ENCOUNTER — Encounter: Payer: Self-pay | Admitting: Podiatry

## 2015-12-03 ENCOUNTER — Ambulatory Visit (INDEPENDENT_AMBULATORY_CARE_PROVIDER_SITE_OTHER): Payer: Medicare Other | Admitting: Podiatry

## 2015-12-03 DIAGNOSIS — L84 Corns and callosities: Secondary | ICD-10-CM

## 2015-12-03 DIAGNOSIS — M2021 Hallux rigidus, right foot: Secondary | ICD-10-CM

## 2015-12-03 DIAGNOSIS — M2022 Hallux rigidus, left foot: Secondary | ICD-10-CM | POA: Diagnosis not present

## 2015-12-06 ENCOUNTER — Other Ambulatory Visit: Payer: Self-pay

## 2015-12-06 DIAGNOSIS — M7989 Other specified soft tissue disorders: Secondary | ICD-10-CM

## 2015-12-10 NOTE — Progress Notes (Signed)
Subjective: 82 year old male presents the office today for concerns and follow-up evaluation of the left foot ulceration cellulitis. After his last appointment with me he was still having redness last Wednesday he saw Dr. Amalia Hailey. He has continue antibiotics he has not noticed any drainage in the redness has improved. He still notes that the "knot" is still present on the top of his left big toe. Denies any systemic complaints such as fevers, chills, nausea, vomiting. No acute changes since last appointment, and no other complaints at this time.   Objective: AAO x3, NAD DP/PT pulses palpable bilaterally, CRT less than 3 seconds On the dorsal aspect left first MTPJ is a large osteophyte causing the bony prominence. The wound is present with dorsal aspect of the left first MTPJ is healed today. Erythema that was present last appointment has resolved and there is no ascending cellulitis. There is no fluctuance or crepitus there is no malodor. There are no other open lesions or pre-ulcerative lesions identified at this time.  Chronic bilateral lower extremity edema left side worse than right  No open lesions or pre-ulcerative lesions.  No pain with calf compression, swelling, warmth, erythema  Assessment: Resolving cellulitis, ulceration left hallux due to underlying bony prominence from large osteophyte.  Plan: -All treatment options discussed with the patient including all alternatives, risks, complications.  -A referral for vascular was placed by Dr. Amalia Hailey. Awaiting this. Negative for DVT and venous duplex. Follow-up with primary care physician as well. -Discussed strict offloading to this area. This might be an ongoing recurrent issue and if it is discuss removal of the osteophyte. I also discussed shoe gear modifications. Monitor for any further skin breakdown. This time the wound is healed have there is any changes to call the office immediately. -Patient encouraged to call the office with any  questions, concerns, change in symptoms.   Celesta Gentile, DPM

## 2016-01-04 ENCOUNTER — Encounter: Payer: Medicare Other | Admitting: Vascular Surgery

## 2016-01-04 ENCOUNTER — Encounter (HOSPITAL_COMMUNITY): Payer: Medicare Other

## 2016-05-23 LAB — COMPREHENSIVE METABOLIC PANEL: Calcium: 10 (ref 8.7–10.7)

## 2016-05-23 LAB — BASIC METABOLIC PANEL
BUN: 19 (ref 4–21)
CO2: 33 — AB (ref 13–22)
Chloride: 102 (ref 99–108)
Creatinine: 0.9 (ref 0.6–1.3)
Glucose: 91
Potassium: 5.1 (ref 3.4–5.3)
Sodium: 140 (ref 137–147)

## 2016-05-23 LAB — HEMOGLOBIN A1C: Hemoglobin A1C: 6.6

## 2016-11-26 LAB — LIPID PANEL
Cholesterol: 222 — AB (ref 0–200)
HDL: 50 (ref 35–70)
LDL Cholesterol: 118
Triglycerides: 647 — AB (ref 40–160)

## 2016-11-26 LAB — CBC AND DIFFERENTIAL
HCT: 43 (ref 41–53)
Hemoglobin: 14.5 (ref 13.5–17.5)
Platelets: 241 (ref 150–399)
WBC: 7.6

## 2016-11-26 LAB — CBC: RBC: 4.35 (ref 3.87–5.11)

## 2017-01-14 ENCOUNTER — Encounter: Payer: Self-pay | Admitting: Physical Therapy

## 2017-01-14 ENCOUNTER — Ambulatory Visit: Payer: Medicare Other | Attending: Internal Medicine | Admitting: Physical Therapy

## 2017-01-14 DIAGNOSIS — R29898 Other symptoms and signs involving the musculoskeletal system: Secondary | ICD-10-CM | POA: Insufficient documentation

## 2017-01-14 DIAGNOSIS — R42 Dizziness and giddiness: Secondary | ICD-10-CM | POA: Diagnosis present

## 2017-01-14 DIAGNOSIS — M6281 Muscle weakness (generalized): Secondary | ICD-10-CM | POA: Insufficient documentation

## 2017-01-14 DIAGNOSIS — M79605 Pain in left leg: Secondary | ICD-10-CM | POA: Diagnosis present

## 2017-01-14 DIAGNOSIS — R293 Abnormal posture: Secondary | ICD-10-CM | POA: Insufficient documentation

## 2017-01-14 DIAGNOSIS — R2681 Unsteadiness on feet: Secondary | ICD-10-CM | POA: Diagnosis present

## 2017-01-14 DIAGNOSIS — M79604 Pain in right leg: Secondary | ICD-10-CM | POA: Insufficient documentation

## 2017-01-14 DIAGNOSIS — R2689 Other abnormalities of gait and mobility: Secondary | ICD-10-CM | POA: Insufficient documentation

## 2017-01-15 NOTE — Therapy (Signed)
[facility] [address] [place], [place], [address] Phone: [phone]   Fax:  [phone]  Physical Therapy Evaluation  Patient Details  Name: Brian [name] MRN: [id number] Date of Birth: [birth year] Referring Provider: Benjamine Sprague, MD, MD  Encounter Date: 01/14/2017      PT End of Session - 01/14/17 1408    Visit Number 1   Number of Visits 17   Date for PT Re-Evaluation 03/13/17   Authorization Type Medicare G-code & Progress note every 10th visit   PT Start Time 1315   PT Stop Time 1400   PT Time Calculation (min) 45 min   Equipment Utilized During Treatment Gait belt   Activity Tolerance Patient tolerated treatment well;Other (comment)  limited by dizziness   Behavior During Therapy [facility] for tasks assessed/performed      History reviewed. No pertinent past medical history.  History reviewed. No pertinent surgical history.  There were no vitals filed for this visit.       Subjective Assessment - 01/14/17 1321    Subjective This 83yo male was referred on 11/26/16 with gait disorder, dyspnea & physical deconditioning by Benjamine Sprague, MD.    Pertinent History restrictive lung disease, HTN, DM, OA, chronic LBP, left knee pain, right knee replacement 2010, peripheral neuropathy,    Limitations Lifting;Standing;Walking;House hold activities   Patient Stated Goals Improve balance, walk with dexterity,    Currently in Pain? Yes   Pain Score 2   in last week, worst 8/10, best 0/10   Pain Location Knee   Pain Orientation Left   Pain Descriptors / Indicators Aching;Sharp   Pain Type Chronic pain   Pain Onset More than a month ago   Pain Frequency Intermittent   Aggravating Factors  standing & walking   Pain Relieving Factors sit or lay down   Effect of Pain on Daily Activities limits standing   Pain Score 2  in last week, worst 5/10, best 0/10   Pain Location Hip   Pain Orientation Left;Right   Pain Descriptors / Indicators Sore;Sharp   Pain Type Chronic pain   Pain Onset More than a month ago   Pain Frequency Intermittent   Aggravating Factors  sit for long time then get up   Pain Relieving Factors moving around   Effect of Pain on Daily Activities moves during day   Pain Score 1  in last week, worst 3/10, best 0/10   Pain Location Back   Pain Orientation Mid;Lower   Pain Descriptors / Indicators Aching   Pain Type Chronic pain   Pain Onset More than a month ago   Pain Frequency Intermittent   Aggravating Factors  quick twist, sitting in bad seat like church pew   Pain Relieving Factors walk it out            [facility] PT Assessment - 01/14/17 1315      Assessment   Medical Diagnosis gait disorder   Referring Provider Benjamine Sprague, MD   Onset Date/Surgical Date 11/26/16  MD referral   Hand Dominance Left   Prior Therapy ortho PT discharged 09/27/15     Precautions   Precautions Fall     Balance Screen   Has the patient fallen in the past 6 months No   Has the patient had a decrease in activity level because of a fear of falling?  Yes   Is the patient reluctant to leave their home because of a fear of falling?  No     Home Environment   Living Environment Private residence   Living Arrangements Alone   Available Help at Discharge Family;Neighbor   Type of [place] Access Stairs to enter;Ramped entrance  garage has ramp   Entrance Stairs-Number of Steps 2   Entrance Stairs-Rails Left   Home Layout One level   [place] - single point;Walker - 2 wheels;Grab bars - tub/shower     Prior Function   Level of Independence Independent with household mobility without device;Independent with community mobility with device  community with cane   Vocation Retired  retired Company secretary   Leisure restore old cars & radios,      Observation/Other Assessments   Focus on Therapeutic Outcomes (FOTO)  45.63 Functional Status   Activities of Balance  Confidence Scale (ABC Scale)  48.1%     Posture/Postural Control   Posture/Postural Control Postural limitations   Postural Limitations Rounded Shoulders;Forward head;Increased thoracic kyphosis;Flexed trunk;Weight shift right     ROM / Strength   AROM / PROM / Strength AROM;Strength     AROM   Overall AROM  Within functional limits for tasks performed     Strength   Overall Strength Deficits   Strength Assessment Site Hip;Knee;Ankle   Right/Left Hip Right;Left   Right Hip Flexion 5/5   Right Hip Extension 3-/5   Right Hip ABduction 3-/5   Left Hip Flexion 4/5   Left Hip Extension 3-/5   Left Hip ABduction 3-/5   Right/Left Knee Right;Left   Right Knee Flexion 4/5   Right Knee Extension 5/5   Left Knee Flexion 4/5   Left Knee Extension 4/5   Right/Left Ankle Right;Left   Right Ankle Dorsiflexion 5/5   Left Ankle Dorsiflexion 4/5     Transfers   Transfers Sit to Stand;Stand to Sit   Sit to Stand 5: Supervision;With upper extremity assist;With armrests;From chair/3-in-1   Stand to Sit 5: Supervision;With upper extremity assist;With armrests;To chair/3-in-1     Ambulation/Gait   Ambulation/Gait Yes   Ambulation/Gait Assistance 5: Supervision   Ambulation Distance (Feet) 150 Feet   Assistive device Straight cane   Gait Pattern Step-through pattern;Decreased arm swing - left;Decreased step length - right;Decreased stance time - left;Decreased stride length;Antalgic;Trunk flexed;Wide base of support   Ambulation Surface Indoor;Level   Gait velocity 2.81 ft/sec   Stairs Yes   Stairs Assistance 5: Supervision   Stair Management Technique One rail Left;Step to pattern;Forwards   Number of Stairs 4     Standardized Balance Assessment   Standardized Balance Assessment Berg Balance Test;Timed Up and Go Test     Berg Balance Test   Sit to Stand Able to stand  independently using hands   Standing Unsupported Able to stand 2 minutes with supervision   Sitting with Back  Unsupported but Feet Supported on Floor or Stool Able to sit safely and securely 2 minutes   Stand to Sit Uses backs of legs against chair to control descent   Transfers Able to transfer safely, definite need of hands   Standing Unsupported with Eyes Closed Unable to keep eyes closed 3 seconds but stays steady   Standing Ubsupported with Feet Together Needs help to attain position but able to stand for 30 seconds with feet together   From Standing, Reach Forward with Outstretched Arm Can reach forward >5 cm safely (2")   From Standing Position, Pick up Object from Floor Unable to pick up and needs supervision  From Standing Position, Turn to Look Behind Over each Shoulder Needs supervision when turning   Turn 360 Degrees Needs assistance while turning   Standing Unsupported, Alternately Place Feet on Step/Stool Needs assistance to keep from falling or unable to try   Standing Unsupported, One Foot in [place] to take small step independently and hold 30 seconds   Standing on One Leg Tries to lift leg/unable to hold 3 seconds but remains standing independently   Total Score 24     Timed Up and Go Test   Normal TUG (seconds) 11.68  11.68sec no device, 12.30sec cane     Patient reported dizziness ("Gyro" in his head) with looking over shoulders and bending over to pickup object from floor. PT monitored HR & oxygen saturation with activities: Initial at rest 97% O2 & 73 HR, after Berg 96% O2 & 87 HR, after gait 97% & 90 HR.        Objective measurements completed on examination: See above findings.                    PT Short Term Goals - 01/14/17 1837      PT SHORT TERM GOAL #1   Title Patient demonstrates understanding of initial HEP.    Time 4   Period Weeks   Status New   Target Date 02/11/17     PT SHORT TERM GOAL #2   Title Patient verbalizes understanding of fall prevention strategies.    Time 4   Period Weeks   Status New   Target Date 02/11/17      PT SHORT TERM GOAL #3   Title Patient ambulates 300' with LRAD with supervision.    Time 4   Period Weeks   Status New   Target Date 02/11/17           PT Long Term Goals - 01/14/17 1431      PT LONG TERM GOAL #1   Title Patient demonstrates & verbalizes ongoing fitness plan / HEP.  (All LTGs Target Date: 03/13/17)   Time 8   Period Weeks   Status New   Target Date 03/13/17     PT LONG TERM GOAL #2   Title Berg Balance >36/56 to indicate lower fall risk.    Time 8   Period Weeks   Status New   Target Date 03/13/17     PT LONG TERM GOAL #3   Title Patient ambulates with LRAD for balance & pain 1000' modified independent.    Time 8   Period Weeks   Status New   Target Date 03/13/17     PT LONG TERM GOAL #4   Title Assess dizziness / vestibular & set LTG     PT LONG TERM GOAL #5   Title Patient negotiates ramps, curbs & stairs with LRAD modified independent.    Time 8   Period Weeks   Status New   Target Date 03/13/17     Additional Long Term Goals   Additional Long Term Goals Yes     PT LONG TERM GOAL #6   Title Patient reports pain increases <2 increments on 0-10 scale.    Time 8   Period Weeks   Status New   Target Date 03/13/17     PT LONG TERM GOAL #7   Title Activities of Balance score >55%   Time 8   Period Weeks   Status New   Target Date 03/13/17  Plan - 01-22-17 1422    Clinical Impression Statement This 83yo male has history of orthopedic, arthritic issues limiting mobility. He noted increased weakness in his legs over last couple of momths with physical deconditioning. He has dizziness with activities in standing increasing fall risk. He has pain in hip & knee pain limiting his mobility. Berg Balance of 24/56 indicates high fall risk. Patient has gait deviations indicating fall risk. Patient appears would benefit from skilled physical therapy to improve mobility & decrease fall risk.   History and Personal Factors  relevant to plan of care: restrictive lung disease, HTN, DM, OA, chronic LBP, left knee pain, right knee replacement 2010, peripheral neuropathy,    Clinical Presentation Evolving   Clinical Presentation due to: dizziness, COPD, orthopedic issues with pain   Clinical Decision Making Moderate   Rehab Potential Good   Clinical Impairments Affecting Rehab Potential restrictive lung disease, HTN, DM, OA, chronic LBP, left knee pain, right knee replacement 2010, peripheral neuropathy,    PT Frequency 2x / week   PT Duration 8 weeks   PT Treatment/Interventions ADLs/Self Care Home Management;DME Instruction;Gait training;Stair training;Functional mobility training;Therapeutic activities;Therapeutic exercise;Balance training;Neuromuscular re-education;Patient/family education;Vestibular   PT Next Visit Plan assess vestibular system and set dizziness LTG. establish Initial HEP for strength & flexibility.      Patient will benefit from skilled therapeutic intervention in order to improve the following deficits and impairments:  Abnormal gait, Decreased activity tolerance, Decreased balance, Decreased endurance, Decreased knowledge of use of DME, Decreased mobility, Decreased strength, Dizziness, Postural dysfunction, Impaired flexibility  Visit Diagnosis: Other abnormalities of gait and mobility  Pain in right leg  Pain in left leg  Abnormal posture  Other symptoms and signs involving the musculoskeletal system  Dizziness and giddiness  Muscle weakness (generalized)  Unsteadiness on feet      G-Codes - 2017/01/22 1412    Functional Assessment Tool Used (Outpatient Only) Berg Balance 26/56   Functional Limitation Mobility: Walking and moving around   Mobility: Walking and Moving Around Current Status [phone]) At least 60 percent but less than 80 percent impaired, limited or restricted   Mobility: Walking and Moving Around Goal Status [phone]) At least 40 percent but less than 60 percent  impaired, limited or restricted       Problem List There are no active problems to display for this patient.   Jamey Reas  PT, DPT 01/15/2017, 2:42 PM  [facility] [address] [place], [place], [address] Phone: [phone]   Fax:  [phone]  Name: Brian [name] MRN: [id number] Date of Birth: 1931/07/01

## 2017-01-20 ENCOUNTER — Encounter: Payer: Self-pay | Admitting: Physical Therapy

## 2017-01-20 ENCOUNTER — Ambulatory Visit: Payer: Medicare Other | Admitting: Physical Therapy

## 2017-01-20 DIAGNOSIS — M79604 Pain in right leg: Secondary | ICD-10-CM

## 2017-01-20 DIAGNOSIS — R2689 Other abnormalities of gait and mobility: Secondary | ICD-10-CM

## 2017-01-20 DIAGNOSIS — M6281 Muscle weakness (generalized): Secondary | ICD-10-CM

## 2017-01-20 DIAGNOSIS — R2681 Unsteadiness on feet: Secondary | ICD-10-CM

## 2017-01-20 DIAGNOSIS — R42 Dizziness and giddiness: Secondary | ICD-10-CM

## 2017-01-20 DIAGNOSIS — R293 Abnormal posture: Secondary | ICD-10-CM

## 2017-01-20 DIAGNOSIS — R29898 Other symptoms and signs involving the musculoskeletal system: Secondary | ICD-10-CM

## 2017-01-20 DIAGNOSIS — M79605 Pain in left leg: Secondary | ICD-10-CM

## 2017-01-20 NOTE — Therapy (Signed)
[facility] [address] [place], [place], [address] Phone: [phone]   Fax:  [phone]  Physical Therapy Treatment  Patient Details  Name: Brian [name] MRN: [id number] Date of Birth: May 30, 1931 Referring Provider: Benjamine Sprague, MD  Encounter Date: 01/20/2017      PT End of Session - 01/20/17 1553    Visit Number 2   Number of Visits 17   Date for PT Re-Evaluation 03/13/17   Authorization Type Medicare G-code & Progress note every 10th visit   PT Start Time 1400   PT Stop Time 1448   PT Time Calculation (min) 48 min   Activity Tolerance Patient tolerated treatment well   Behavior During Therapy [facility] for tasks assessed/performed      History reviewed. No pertinent past medical history.  History reviewed. No pertinent surgical history.  There were no vitals filed for this visit.      Subjective Assessment - 01/20/17 1403    Subjective No falls or "close calls".  Went up the stairs at church to video the service-felt fatigued and SOB after negotiating the stairs.   Pertinent History restrictive lung disease, HTN, DM, OA, chronic LBP, left knee pain, right knee replacement 2010, peripheral neuropathy,    Limitations Lifting;Standing;Walking;House hold activities   Patient Stated Goals Improve balance, walk with dexterity,    Currently in Pain? Yes   Pain Score 2    Pain Location Knee   Pain Orientation Right;Left   Pain Descriptors / Indicators Aching   Pain Type Chronic pain   Pain Onset More than a month ago   Pain Onset More than a month ago   Pain Onset More than a month ago                Vestibular Assessment - 01/20/17 1405      Vestibular Assessment   General Observation No evidence of imbalance when ambulating from waiting area; pt reports history of "vertigo" when he gets an infection.  Current dizziness is more of an imbalance.  Has had one significant fall stepping off a curb  and hit his face on the curb/street.     Symptom Behavior   Type of Dizziness Imbalance   Frequency of Dizziness daily   Duration of Dizziness n/a   Aggravating Factors Sit to stand   Relieving Factors Not applicable     Occulomotor Exam   Occulomotor Alignment Abnormal   Spontaneous Absent   Gaze-induced Absent   Smooth Pursuits Intact   Saccades Intact   Comment convergence intact     Vestibulo-Occular Reflex   VOR to Slow Head Movement Normal   Comment HIT: unable to accurately assess due to pt guarding neck movement     Other Tests   Comments Balance strategy assessment with perturbations: pt with good use of ankle, decreased use of hip and premature use of step strategy.  Unable to maintain static standing with eyes closed                      Balance Exercises - 01/20/17 1424      Balance Exercises: Standing   Standing Eyes Closed Wide (BOA);Solid surface;2 reps;10 secs   Wall Bumps Hip   Wall Bumps-Hips Eyes opened;Eyes closed;Anterior/posterior;10 reps     OTAGO PROGRAM   Ankle Plantorflexors 20 reps, support  5 reps due to knee pain/weakness   Ankle Dorsiflexors 20 reps, support  5 reps due to knee pain and weakness  PT Education - 01/20/17 1552    Education provided Yes   Education Details role of each system for balance, balance reactions, HEP   Person(s) Educated Patient   Methods Explanation   Comprehension Verbalized understanding          PT Short Term Goals - 01/14/17 1837      PT SHORT TERM GOAL #1   Title Patient demonstrates understanding of initial HEP.    Time 4   Period Weeks   Status New   Target Date 02/11/17     PT SHORT TERM GOAL #2   Title Patient verbalizes understanding of fall prevention strategies.    Time 4   Period Weeks   Status New   Target Date 02/11/17     PT SHORT TERM GOAL #3   Title Patient ambulates 300' with LRAD with supervision.    Time 4   Period Weeks   Status New   Target  Date 02/11/17           PT Long Term Goals - 01/20/17 1559      PT LONG TERM GOAL #1   Title Patient demonstrates & verbalizes ongoing fitness plan / HEP.  (All LTGs Target Date: 03/13/17)   Time 8   Period Weeks   Status New   Target Date 03/13/17     PT LONG TERM GOAL #2   Title Berg Balance >36/56 to indicate lower fall risk.    Time 8   Period Weeks   Status New   Target Date 03/13/17     PT LONG TERM GOAL #3   Title Patient ambulates with LRAD for balance & pain 1000' modified independent.    Time 8   Period Weeks   Status New   Target Date 03/13/17     PT LONG TERM GOAL #4   Title Assess dizziness / vestibular & set LTG   Baseline achieved; no specific vestibular goal needed. Pt demonstrates decreased use of vestibular and somatosensory systems for balance, increased use of visual systems   Status Achieved     PT LONG TERM GOAL #5   Title Patient negotiates ramps, curbs & stairs with LRAD modified independent.    Time 8   Period Weeks   Status New   Target Date 03/13/17     PT LONG TERM GOAL #6   Title Patient reports pain increases <2 increments on 0-10 scale.    Time 8   Period Weeks   Status New   Target Date 03/13/17     PT LONG TERM GOAL #7   Title Activities of Balance score >55%   Time 8   Period Weeks   Status New   Target Date 03/13/17               Plan - 01/20/17 1553    Clinical Impression Statement Treatment session focused on assessment of vestibular system due to reports of dizziness at eval.  Pt's does report intermittent vertigo with infections, hypofunction?, but current dizziness is described as disequilibrium and an inability to control movement of COG outside limits of stability.  Pt demonstrates decreased use of vestibular and somatosensory systems (visually dependent) and poor use of balance strategies.  Initiated balance HEP to focus on balance strategies and postural control with decreased visual input.  Will continue to  address and progress towards LTG.   Rehab Potential Good   Clinical Impairments Affecting Rehab Potential restrictive lung disease, HTN, DM, OA, chronic LBP, left  knee pain, right knee replacement 2010, peripheral neuropathy,    PT Frequency 2x / week   PT Duration 8 weeks   PT Treatment/Interventions ADLs/Self Care Home Management;DME Instruction;Gait training;Stair training;Functional mobility training;Therapeutic activities;Therapeutic exercise;Balance training;Neuromuscular re-education;Patient/family education;Vestibular   PT Next Visit Plan review HEP; 6 minute walk of endurance and set up with walking program?  ankle and hip balance strategy training.   Consulted and Agree with Plan of Care Patient      Patient will benefit from skilled therapeutic intervention in order to improve the following deficits and impairments:  Abnormal gait, Decreased activity tolerance, Decreased balance, Decreased endurance, Decreased knowledge of use of DME, Decreased mobility, Decreased strength, Dizziness, Postural dysfunction, Impaired flexibility  Visit Diagnosis: Other abnormalities of gait and mobility  Pain in right leg  Pain in left leg  Abnormal posture  Other symptoms and signs involving the musculoskeletal system  Dizziness and giddiness  Muscle weakness (generalized)  Unsteadiness on feet     Problem List There are no active problems to display for this patient.   Rico Junker, PT, DPT 01/20/17    4:02 PM    [facility] [address] [place], [place], [address] Phone: [phone]   Fax:  [phone]  Name: ABDULLOH ULLOM MRN: [id number] Date of Birth: 04-10-1931

## 2017-01-20 NOTE — Patient Instructions (Addendum)
Balancing Act    In a standing position, go up on toes and down. Then back on heels. For balance, use support or put arms out in front. Repeat __5__ times. Do __2-3__ sessions per day.  Weight Shift: Anterior / Posterior (Righting / Equilibrium)    Have a chair in front of you Slowly shift weight forward, arms back and hips forward over toes, until heels rise off floor. Return to starting position. Shift weight backward, arms forward and hips back over heels, until toes rise off floor. Hold each position __2-3__ seconds. Repeat _10___ times per session.  Eyes open and then eyes closed.  Do __2__ sessions per day.  Copyright  VHI. All rights reserved.  .  Balance: Eyes Closed - Bilateral (Varied Surfaces)    Stand, feet shoulder width, close eyes, have a chair in front of you. Maintain balance 10 seconds. Repeat 2 times per set. Do 2 sets per session.  As your balance gets better, bring your feet closer together.

## 2017-01-23 ENCOUNTER — Encounter: Payer: Self-pay | Admitting: Physical Therapy

## 2017-01-23 ENCOUNTER — Ambulatory Visit: Payer: Medicare Other | Attending: Internal Medicine | Admitting: Physical Therapy

## 2017-01-23 DIAGNOSIS — R42 Dizziness and giddiness: Secondary | ICD-10-CM | POA: Diagnosis present

## 2017-01-23 DIAGNOSIS — M79604 Pain in right leg: Secondary | ICD-10-CM | POA: Diagnosis present

## 2017-01-23 DIAGNOSIS — R293 Abnormal posture: Secondary | ICD-10-CM

## 2017-01-23 DIAGNOSIS — R2681 Unsteadiness on feet: Secondary | ICD-10-CM | POA: Diagnosis present

## 2017-01-23 DIAGNOSIS — R29898 Other symptoms and signs involving the musculoskeletal system: Secondary | ICD-10-CM | POA: Diagnosis present

## 2017-01-23 DIAGNOSIS — M79605 Pain in left leg: Secondary | ICD-10-CM | POA: Diagnosis present

## 2017-01-23 DIAGNOSIS — M6281 Muscle weakness (generalized): Secondary | ICD-10-CM | POA: Insufficient documentation

## 2017-01-23 DIAGNOSIS — R2689 Other abnormalities of gait and mobility: Secondary | ICD-10-CM | POA: Diagnosis not present

## 2017-01-23 NOTE — Patient Instructions (Signed)
WALKING  Walking is a great form of exercise to increase your strength, endurance and overall fitness.  A walking program can help you start slowly and gradually build endurance as you go.  Everyone's ability is different, so each person's starting point will be different.  You do not have to follow them exactly.  The are just samples. You should simply find out what's right for you and stick to that program.   In the beginning, you'll start off walking 2-3 times a day for short distances.  As you get stronger, you'll be walking further at just 1-2 times per day.  A. You Can Walk For A Certain Length Of Time Each Day    Walk 5 minutes 3 times per day.   Increase 1 minute every week (2-3 times per day).    Work up to 10 minutes (3 times per day) for 30 minutes total   Example:   Week 1 5 minutes 3 times per day   Week 2 6 minutes 3 times per day   Week 3 7 minutes 3 times per day   Week 4 8 minutes 3 times per day   Week 5 9 minutes 3 times per day   Week 6 10 minutes 3 times per day

## 2017-01-23 NOTE — Therapy (Signed)
[facility] [address] [place], [place], [address] Phone: [phone]   Fax:  [phone]  Physical Therapy Treatment  Patient Details  Name: Brian [name] MRN: [id number] Date of Birth: [birth year] Referring Provider: Benjamine Sprague, MD  Encounter Date: 01/23/2017      PT End of Session - 01/23/17 1618    Visit Number 3   Number of Visits 17   Date for PT Re-Evaluation 03/13/17   Authorization Type Medicare G-code & Progress note every 10th visit   PT Start Time 1535   PT Stop Time 1616   PT Time Calculation (min) 41 min   Activity Tolerance Patient tolerated treatment well   Behavior During Therapy [facility] for tasks assessed/performed      History reviewed. No pertinent past medical history.  History reviewed. No pertinent surgical history.  There were no vitals filed for this visit.      Subjective Assessment - 01/23/17 1539    Subjective Pt reports having soreness in LLE medial lower leg and into arch of foot.  Has been performing all exercises and walking more.     Pertinent History restrictive lung disease, HTN, DM, OA, chronic LBP, left knee pain, right knee replacement 2010, peripheral neuropathy,    Limitations Lifting;Standing;Walking;House hold activities   Patient Stated Goals Improve balance, walk with dexterity,    Currently in Pain? Yes   Pain Score 8    Pain Location Leg   Pain Orientation Left;Lower;Medial   Pain Descriptors / Indicators Sore   Pain Type Acute pain   Pain Onset In the past 7 days   Pain Frequency Intermittent   Pain Onset More than a month ago   Pain Onset More than a month ago            [facility] PT Assessment - 01/23/17 1543      6 Minute Walk- Baseline   6 Minute Walk- Baseline yes   BP (mmHg) 137/77   HR (bpm) 77   02 Sat (%RA) 92 %   Modified Borg Scale for Dyspnea 0- Nothing at all   Perceived Rate of Exertion (Borg) 7- Very, very light     6 Minute walk-  Post Test   6 Minute Walk Post Test yes   BP (mmHg) 171/77   HR (bpm) 100   02 Sat (%RA) 93 %   Modified Borg Scale for Dyspnea 5- Strong or hard breathing   Perceived Rate of Exertion (Borg) 16-     6 minute walk test results    Aerobic Endurance Distance Walked 781       Initiated walking program               [facility] Adult PT Treatment/Exercise - 01/23/17 1601      Ambulation/Gait   Stairs Yes   Stairs Assistance 5: Supervision   Stairs Assistance Details (indicate cue type and reason) ascends forwards and descends backwards to simulate stairs up to attic with R rail   Stair Management Technique One rail Right;Step to pattern;Forwards;Backwards   Number of Stairs 12   Height of Stairs 6     Exercises   Exercises Ankle     Ankle Exercises: Stretches   Gastroc Stretch 2 reps;60 seconds   Gastroc Stretch Limitations standing on step, dropping heels down     Attempted heel raises with UE support on stairs but pt unable to perform safely: pt tends to lean forwards and bend knees.  This is  likely the source of his foot and knee pain.           PT Education - 01/23/17 1618    Education provided Yes   Education Details walking program; cease standing heel raises and toe raises due to inability to perform safely    Person(s) Educated Patient   Methods Explanation   Comprehension Verbalized understanding          PT Short Term Goals - 01/14/17 1837      PT SHORT TERM GOAL #1   Title Patient demonstrates understanding of initial HEP.    Time 4   Period Weeks   Status New   Target Date 02/11/17     PT SHORT TERM GOAL #2   Title Patient verbalizes understanding of fall prevention strategies.    Time 4   Period Weeks   Status New   Target Date 02/11/17     PT SHORT TERM GOAL #3   Title Patient ambulates 300' with LRAD with supervision.    Time 4   Period Weeks   Status New   Target Date 02/11/17           PT Long Term Goals - 01/23/17 1627       PT LONG TERM GOAL #1   Title Patient demonstrates & verbalizes ongoing fitness plan / HEP.  (All LTGs Target Date: 03/13/17)   Time 8   Period Weeks   Status On-going   Target Date 03/13/17     PT LONG TERM GOAL #2   Title Berg Balance >36/56 to indicate lower fall risk.    Time 8   Period Weeks   Status On-going   Target Date 03/13/17     PT LONG TERM GOAL #3   Title Patient ambulates with LRAD for balance & pain >880 feet in 6 minutes (6 min walk test) modified independent.    Time 8   Period Weeks   Status On-going   Target Date 03/13/17     PT LONG TERM GOAL #4   Title Assess dizziness / vestibular & set LTG   Baseline achieved; no specific vestibular goal needed. Pt demonstrates decreased use of vestibular and somatosensory systems for balance, increased use of visual systems   Status Achieved     PT LONG TERM GOAL #5   Title Patient negotiates ramps, curbs & 10 stairs (forwards to ascend, backwards to descend with R rail to mimic attic entry) with LRAD modified independent.    Time 8   Period Weeks   Status Revised   Target Date 03/13/17     PT LONG TERM GOAL #6   Title Patient reports pain increases <2 increments on 0-10 scale.    Time 8   Period Weeks   Status On-going   Target Date 03/13/17     PT LONG TERM GOAL #7   Title Activities of Balance score >55%   Time 8   Period Weeks   Status On-going   Target Date 03/13/17               Plan - 01/23/17 1618    Clinical Impression Statement Treatment session focused on assessment of pt endurance in order to initiate walking program at home.  Also performed stair negotiation training with focus on performing sequence pt needs to negotiate stairs to get into attic at home.  Due to pain in foot and medial lower leg reviewed exercises from last session.  Pt unable to perform heel raises  and toe raises safely due to weakness (shifts forwards and bends knees); had pt remove this exercise from HEP-will  find a way to strengthen these muscles in sitting first.  Will continue to progress as pt tolerates.   Rehab Potential Good   Clinical Impairments Affecting Rehab Potential restrictive lung disease, HTN, DM, OA, chronic LBP, left knee pain, right knee replacement 2010, peripheral neuropathy,    PT Frequency 2x / week   PT Duration 8 weeks   PT Treatment/Interventions ADLs/Self Care Home Management;DME Instruction;Gait training;Stair training;Functional mobility training;Therapeutic activities;Therapeutic exercise;Balance training;Neuromuscular re-education;Patient/family education;Vestibular   PT Next Visit Plan how is walking program going? should be 5 minutes 2-3x/day.  Find gastroc and ankle DF exercises pt can perform in sitting; hip and ankle balance strategy training.  Stair negotiation training to mimic attic-10 forwards to ascend with R rail, backwards to descend   Consulted and Agree with Plan of Care Patient      Patient will benefit from skilled therapeutic intervention in order to improve the following deficits and impairments:  Abnormal gait, Decreased activity tolerance, Decreased balance, Decreased endurance, Decreased knowledge of use of DME, Decreased mobility, Decreased strength, Dizziness, Postural dysfunction, Impaired flexibility  Visit Diagnosis: Other abnormalities of gait and mobility  Pain in right leg  Pain in left leg  Abnormal posture  Other symptoms and signs involving the musculoskeletal system  Muscle weakness (generalized)  Unsteadiness on feet     Problem List There are no active problems to display for this patient.  Rico Junker, PT, DPT 01/23/17    4:41 PM    [facility] [address] [place], [place], [address] Phone: [phone]   Fax:  [phone]  Name: Brian [name] MRN: [id number] Date of Birth: [birth year]

## 2017-01-27 ENCOUNTER — Encounter: Payer: Self-pay | Admitting: Physical Therapy

## 2017-01-27 ENCOUNTER — Ambulatory Visit: Payer: Medicare Other | Admitting: Physical Therapy

## 2017-01-27 DIAGNOSIS — M6281 Muscle weakness (generalized): Secondary | ICD-10-CM

## 2017-01-27 DIAGNOSIS — R293 Abnormal posture: Secondary | ICD-10-CM

## 2017-01-27 DIAGNOSIS — R2689 Other abnormalities of gait and mobility: Secondary | ICD-10-CM

## 2017-01-27 DIAGNOSIS — M79605 Pain in left leg: Secondary | ICD-10-CM

## 2017-01-27 DIAGNOSIS — R29898 Other symptoms and signs involving the musculoskeletal system: Secondary | ICD-10-CM

## 2017-01-27 DIAGNOSIS — R2681 Unsteadiness on feet: Secondary | ICD-10-CM

## 2017-01-27 NOTE — Therapy (Signed)
[facility] [address] [place], [place], [address] Phone: [phone]   Fax:  [phone]  Physical Therapy Treatment  Patient Details  Name: Brian [name] MRN: [id number] Date of Birth: Feb 14, 1932 Referring Provider: Benjamine Sprague, MD   Encounter Date: 01/27/2017  PT End of Session - 01/27/17 1626    Visit Number  4    Number of Visits  17    Date for PT Re-Evaluation  03/13/17    Authorization Type  Medicare G-code & Progress note every 10th visit    PT Start Time  1445    PT Stop Time  1530    PT Time Calculation (min)  45 min    Activity Tolerance  Patient tolerated treatment well    Behavior During Therapy  [facility] for tasks assessed/performed       History reviewed. No pertinent past medical history.  History reviewed. No pertinent surgical history.  There were no vitals filed for this visit.  Subjective Assessment - 01/27/17 1452    Subjective  No falls. The walking program initially bothered his left knee but is better now. He worries about knee buckling with fatigue.     Pertinent History  restrictive lung disease, HTN, DM, OA, chronic LBP, left knee pain, right knee replacement 2010, peripheral neuropathy,     Limitations  Lifting;Standing;Walking;House hold activities    Patient Stated Goals  Improve balance, walk with dexterity,     Currently in Pain?  Yes    Pain Score  4     Pain Location  Knee    Pain Orientation  Left    Pain Descriptors / Indicators  Aching    Pain Type  Chronic pain    Pain Onset  More than a month ago    Pain Frequency  Intermittent    Aggravating Factors   standing & walking    Pain Relieving Factors  sit or lay down      Gait Training:  PT instructed with demo in proper use of rollator walker with seat and benefits to use. Pt ambulated 200' X 2 with verbal cues only. Pt able to perform 180* turn with sit to /from stand from seat with cues only. PT demo, instructed in  negotiating ramps & curbs with rollator walker and pt return demo understanding with verbal cues only.  PT instructed, demo difference in cane designs as patient reports he has a quad cane at home. PT trialed quad tip on his straight cane and pt ambulated 120' with supervision. Pt reports quad tip made his cane feel more secure.  Therapeutic Exercise: PT verbally instructed in types (flexibility, strengthening, balance & endurance) to improve mobility. PT recommended for walking program established last session to use a rollator style for safety as left knee fatigues / buckles and arthritic pain maybe increased by cane walking longer distances. Pt verbalized understanding.                         PT Education - 01/27/17 1625    Education provided  Yes    Education Details  rollator walkers with recommendation for [employer], quad tip for his cane,      Person(s) Educated  Patient    Methods  Explanation;Verbal cues;Other (comment) internet info on walker   internet info on walker   Comprehension  Verbalized understanding       PT Short Term Goals - 01/27/17 1626  PT SHORT TERM GOAL #1   Title  Patient demonstrates understanding of initial HEP.     Time  4    Period  Weeks    Status  On-going    Target Date  02/11/17      PT SHORT TERM GOAL #2   Title  Patient verbalizes understanding of fall prevention strategies.     Time  4    Period  Weeks    Status  On-going    Target Date  02/11/17      PT SHORT TERM GOAL #3   Title  Patient ambulates 300' with LRAD with supervision.     Time  4    Period  Weeks    Status  On-going    Target Date  02/11/17        PT Long Term Goals - 01/27/17 1627      PT LONG TERM GOAL #1   Title  Patient demonstrates & verbalizes ongoing fitness plan / HEP.  (All LTGs Target Date: 03/13/17)    Time  8    Period  Weeks    Status  On-going    Target Date  03/13/17      PT LONG TERM GOAL #2   Title  Berg Balance  >36/56 to indicate lower fall risk.     Time  8    Period  Weeks    Status  On-going    Target Date  03/13/17      PT LONG TERM GOAL #3   Title  Patient ambulates with LRAD for balance & pain >880 feet in 6 minutes (6 min walk test) modified independent.     Time  8    Period  Weeks    Status  On-going    Target Date  03/13/17      PT LONG TERM GOAL #4   Title  Assess dizziness / vestibular & set LTG    Baseline  achieved; no specific vestibular goal needed. Pt demonstrates decreased use of vestibular and somatosensory systems for balance, increased use of visual systems    Status  Achieved      PT LONG TERM GOAL #5   Title  Patient negotiates ramps, curbs & 10 stairs (forwards to ascend, backwards to descend with R rail to mimic attic entry) with LRAD modified independent.     Time  8    Period  Weeks    Status  On-going    Target Date  03/13/17      PT LONG TERM GOAL #6   Title  Patient reports pain increases <2 increments on 0-10 scale.     Time  8    Period  Weeks    Status  On-going    Target Date  03/13/17      PT LONG TERM GOAL #7   Title  Activities of Balance score >55%    Time  8    Period  Weeks    Status  On-going    Target Date  03/13/17              Patient will benefit from skilled therapeutic intervention in order to improve the following deficits and impairments:     Visit Diagnosis: Other abnormalities of gait and mobility  Pain in left leg  Abnormal posture  Other symptoms and signs involving the musculoskeletal system  Muscle weakness (generalized)  Unsteadiness on feet     Problem List There are no active problems to  display for this patient.   Jamey Reas PT, DPT 01/27/2017, 4:31 PM  [facility] [address] [place], [place], [address] Phone: [phone]   Fax:  [phone]  Name: Brian [name] MRN: [id number] Date of Birth: 1931/08/04

## 2017-01-29 ENCOUNTER — Encounter: Payer: Self-pay | Admitting: Physical Therapy

## 2017-01-29 ENCOUNTER — Ambulatory Visit: Payer: Medicare Other | Admitting: Physical Therapy

## 2017-01-29 DIAGNOSIS — R293 Abnormal posture: Secondary | ICD-10-CM

## 2017-01-29 DIAGNOSIS — R2689 Other abnormalities of gait and mobility: Secondary | ICD-10-CM

## 2017-01-29 DIAGNOSIS — M6281 Muscle weakness (generalized): Secondary | ICD-10-CM

## 2017-01-29 DIAGNOSIS — R2681 Unsteadiness on feet: Secondary | ICD-10-CM

## 2017-01-29 DIAGNOSIS — R29898 Other symptoms and signs involving the musculoskeletal system: Secondary | ICD-10-CM

## 2017-01-29 NOTE — Patient Instructions (Addendum)
Fall Prevention in the Home Falls can cause injuries and can affect people from all age groups. There are many simple things that you can do to make your home safe and to help prevent falls. What can I do on the outside of my home?  Regularly repair the edges of walkways and driveways and fix any cracks.  Remove high doorway thresholds.  Trim any shrubbery on the main path into your home.  Use bright outdoor lighting.  Clear walkways of debris and clutter, including tools and rocks.  Regularly check that handrails are securely fastened and in good repair. Both sides of any steps should have handrails.  Install guardrails along the edges of any raised decks or porches.  Have leaves, snow, and ice cleared regularly.  Use sand or salt on walkways during winter months.  In the garage, clean up any spills right away, including grease or oil spills. What can I do in the bathroom?  Use night lights.  Install grab bars by the toilet and in the tub and shower. Do not use towel bars as grab bars.  Use non-skid mats or decals on the floor of the tub or shower.  If you need to sit down while you are in the shower, use a plastic, non-slip stool.  Keep the floor dry. Immediately clean up any water that spills on the floor.  Remove soap buildup in the tub or shower on a regular basis.  Attach bath mats securely with double-sided non-slip rug tape.  Remove throw rugs and other tripping hazards from the floor. What can I do in the bedroom?  Use night lights.  Make sure that a bedside light is easy to reach.  Do not use oversized bedding that drapes onto the floor.  Have a firm chair that has side arms to use for getting dressed.  Remove throw rugs and other tripping hazards from the floor. What can I do in the kitchen?  Clean up any spills right away.  Avoid walking on wet floors.  Place frequently used items in easy-to-reach places.  If you need to reach for something above  you, use a sturdy step stool that has a grab bar.  Keep electrical cables out of the way.  Do not use floor polish or wax that makes floors slippery. If you have to use wax, make sure that it is non-skid floor wax.  Remove throw rugs and other tripping hazards from the floor. What can I do in the stairways?  Do not leave any items on the stairs.  Make sure that there are handrails on both sides of the stairs. Fix handrails that are broken or loose. Make sure that handrails are as long as the stairways.  Check any carpeting to make sure that it is firmly attached to the stairs. Fix any carpet that is loose or worn.  Avoid having throw rugs at the top or bottom of stairways, or secure the rugs with carpet tape to prevent them from moving.  Make sure that you have a light switch at the top of the stairs and the bottom of the stairs. If you do not have them, have them installed. What are some other fall prevention tips?  Wear closed-toe shoes that fit well and support your feet. Wear shoes that have rubber soles or low heels.  When you use a stepladder, make sure that it is completely opened and that the sides are firmly locked. Have someone hold the ladder while you are using   it. Do not climb a closed stepladder.  Add color or contrast paint or tape to grab bars and handrails in your home. Place contrasting color strips on the first and last steps.  Use mobility aids as needed, such as canes, walkers, scooters, and crutches.  Turn on lights if it is dark. Replace any light bulbs that burn out.  Set up furniture so that there are clear paths. Keep the furniture in the same spot.  Fix any uneven floor surfaces.  Choose a carpet design that does not hide the edge of steps of a stairway.  Be aware of any and all pets.  Review your medicines with your healthcare provider. Some medicines can cause dizziness or changes in blood pressure, which increase your risk of falling. Talk with  your health care provider about other ways that you can decrease your risk of falls. This may include working with a physical therapist or trainer to improve your strength, balance, and endurance. This information is not intended to replace advice given to you by your health care provider. Make sure you discuss any questions you have with your health care provider. Document Released: 02/28/2002 Document Revised: 08/07/2015 Document Reviewed: 04/14/2014 Elsevier Interactive Patient Education  2017 [employer] (Dorsiflexion and Plantar Flexion)    Sitting or lying down, point toes up, keeping both heels on floor. Then press toes to floor, raising heels. Repeat _10-15___ times. Do _1-2___ sessions per day.  http://gt2.exer.us/403   Copyright  VHI. All rights reserved.  Ankle Circle    Lift and hold leg still. With foot make _10__ circles clockwise; __10_ counterclockwise.Do _1-2__ sessions per day.  Copyright  VHI. All rights reserved.  Ankle Sideward Movement (Inversion / Eversion)    Sitting or lying with feet on floor, keep knee still and rock foot onto outer edge. Return to resting position. Now rock foot onto inner edge. Repeat with other foot, then with both feet. Repeat __10__ times. Do __1-2__ sessions per day.  http://gt2.exer.us/405   Copyright  VHI. All rights reserved.   Chair Sitting    Sit at edge of seat, spine straight, one leg extended. Put a hand on each thigh and bend forward from the hip, keeping spine straight. Allow hand on extended leg to reach toward toes. Support upper body with other arm. Hold _10-20__ seconds. Repeat __2-3_ times per session. Do __1-2_ sessions per day.  Copyright  VHI. All rights reserved.   Sitting: Unilateral    Sit, one leg straight with towel or belt around foot, other leg bent with foot tucked into inner thigh. Pull toes toward knee. Hold _10-20__ seconds. Repeat _2-3__ times per session. Do _1-2__  sessions per day.  Copyright  VHI. All rights reserved.

## 2017-01-30 ENCOUNTER — Ambulatory Visit: Payer: Medicare Other | Admitting: Physical Therapy

## 2017-01-30 NOTE — Therapy (Signed)
[facility] [address] [place], [place], [address] Phone: [phone]   Fax:  [phone]  Physical Therapy Treatment  Patient Details  Name: Brian [name] MRN: [id number] Date of Birth: 1931/10/13 Referring Provider: Benjamine Sprague, MD   Encounter Date: 01/29/2017  PT End of Session - 01/29/17 1612    Visit Number  5    Number of Visits  17    Date for PT Re-Evaluation  03/13/17    Authorization Type  Medicare G-code & Progress note every 10th visit    PT Start Time  1059    PT Stop Time  1145    PT Time Calculation (min)  46 min    Equipment Utilized During Treatment  Gait belt    Activity Tolerance  Patient tolerated treatment well    Behavior During Therapy  [facility] for tasks assessed/performed       History reviewed. No pertinent past medical history.  History reviewed. No pertinent surgical history.  There were no vitals filed for this visit.  Subjective Assessment - 01/29/17 1059    Subjective  He found his wife's cane which had quad tip. He thinks it helps.     Pertinent History  restrictive lung disease, HTN, DM, OA, chronic LBP, left knee pain, right knee replacement 2010, peripheral neuropathy,     Limitations  Lifting;Standing;Walking;House hold activities    Patient Stated Goals  Improve balance, walk with dexterity,     Currently in Pain?  Yes    Pain Score  3     Pain Location  Knee    Pain Orientation  Left    Pain Descriptors / Indicators  Aching    Pain Type  Chronic pain    Pain Onset  More than a month ago    Pain Frequency  Intermittent    Aggravating Factors   standing & walking    Pain Relieving Factors  sit or lay down    Effect of Pain on Daily Activities  limits standing      Therapeutic Exercise:  Ankle Bend (Dorsiflexion and Plantar Flexion)    Sitting or lying down, point toes up, keeping both heels on floor. Then press toes to floor, raising heels. Repeat _10-15___  times. Do _1-2___ sessions per day.  http://gt2.exer.us/403   Copyright  VHI. All rights reserved.  Ankle Circle    Lift and hold leg still. With foot make _10__ circles clockwise; __10_ counterclockwise.Do _1-2__ sessions per day.  Copyright  VHI. All rights reserved.  Ankle Sideward Movement (Inversion / Eversion)    Sitting or lying with feet on floor, keep knee still and rock foot onto outer edge. Return to resting position. Now rock foot onto inner edge. Repeat with other foot, then with both feet. Repeat __10__ times. Do __1-2__ sessions per day.  http://gt2.exer.us/405   Copyright  VHI. All rights reserved.   Chair Sitting    Sit at edge of seat, spine straight, one leg extended. Put a hand on each thigh and bend forward from the hip, keeping spine straight. Allow hand on extended leg to reach toward toes. Support upper body with other arm. Hold _10-20__ seconds. Repeat __2-3_ times per session. Do __1-2_ sessions per day.  Copyright  VHI. All rights reserved.   Sitting: Unilateral    Perform in same seated position as above exercise, one leg straight with towel or belt around foot, other leg bent with foot tucked into inner thigh. Pull toes toward knee.  Hold _10-20__ seconds. Repeat _2-3__ times per session. Do _1-2__ sessions per day.  Copyright  VHI. All rights reserved.   Combination Gastroc & Hamstring stretch Perform above 2 exercises together. Pull foot up with sheet or towel first then lean forward. Hold 10-20 seconds. Repeat 2-3 times 1-2 per day.    Gait Training: Pt ambulated with single point cane with quad tip working on scanning right/left & up/down while maintaining path & motion with minA for balance.  Activity performed in hall for visual reference of walls for path, manual & verbal cues required. Pt reports he usually has to stop to look at something.                         PT Education - 01/29/17 1130     Education provided  Yes    Education Details  fall prevention strategies, updated HEP with seated ankle exercises & hamstring/gastroc stretches    Person(s) Educated  Patient    Methods  Explanation;Demonstration;Tactile cues;Verbal cues;Handout    Comprehension  Verbalized understanding;Returned demonstration;Need further instruction       PT Short Term Goals - 01/27/17 1626      PT SHORT TERM GOAL #1   Title  Patient demonstrates understanding of initial HEP.     Time  4    Period  Weeks    Status  On-going    Target Date  02/11/17      PT SHORT TERM GOAL #2   Title  Patient verbalizes understanding of fall prevention strategies.     Time  4    Period  Weeks    Status  On-going    Target Date  02/11/17      PT SHORT TERM GOAL #3   Title  Patient ambulates 300' with LRAD with supervision.     Time  4    Period  Weeks    Status  On-going    Target Date  02/11/17        PT Long Term Goals - 01/27/17 1627      PT LONG TERM GOAL #1   Title  Patient demonstrates & verbalizes ongoing fitness plan / HEP.  (All LTGs Target Date: 03/13/17)    Time  8    Period  Weeks    Status  On-going    Target Date  03/13/17      PT LONG TERM GOAL #2   Title  Berg Balance >36/56 to indicate lower fall risk.     Time  8    Period  Weeks    Status  On-going    Target Date  03/13/17      PT LONG TERM GOAL #3   Title  Patient ambulates with LRAD for balance & pain >880 feet in 6 minutes (6 min walk test) modified independent.     Time  8    Period  Weeks    Status  On-going    Target Date  03/13/17      PT LONG TERM GOAL #4   Title  Assess dizziness / vestibular & set LTG    Baseline  achieved; no specific vestibular goal needed. Pt demonstrates decreased use of vestibular and somatosensory systems for balance, increased use of visual systems    Status  Achieved      PT LONG TERM GOAL #5   Title  Patient negotiates ramps, curbs & 10 stairs (forwards to ascend, backwards to  descend with  R rail to mimic attic entry) with LRAD modified independent.     Time  8    Period  Weeks    Status  On-going    Target Date  03/13/17      PT LONG TERM GOAL #6   Title  Patient reports pain increases <2 increments on 0-10 scale.     Time  8    Period  Weeks    Status  On-going    Target Date  03/13/17      PT LONG TERM GOAL #7   Title  Activities of Balance score >55%    Time  8    Period  Weeks    Status  On-going    Target Date  03/13/17            Plan - 01/29/17 1614    Clinical Impression Statement  Patient reports understanding of fall prevention strategies with plans to implement at home. He tolerated seated ankle exercises without knee pain. Patient was challenged by scanning while walking.     Rehab Potential  Good    Clinical Impairments Affecting Rehab Potential  restrictive lung disease, HTN, DM, OA, chronic LBP, left knee pain, right knee replacement 2010, peripheral neuropathy,     PT Frequency  2x / week    PT Duration  8 weeks    PT Treatment/Interventions  ADLs/Self Care Home Management;DME Instruction;Gait training;Stair training;Functional mobility training;Therapeutic activities;Therapeutic exercise;Balance training;Neuromuscular re-education;Patient/family education;Vestibular    PT Next Visit Plan  gait training with cane including scanning & negotiating obstacles, ambulate with rollator for endurance walking program, review negotiating ramps & curbs with rollator    Consulted and Agree with Plan of Care  Patient       Patient will benefit from skilled therapeutic intervention in order to improve the following deficits and impairments:  Abnormal gait, Decreased activity tolerance, Decreased balance, Decreased endurance, Decreased knowledge of use of DME, Decreased mobility, Decreased strength, Dizziness, Postural dysfunction, Impaired flexibility  Visit Diagnosis: Other abnormalities of gait and mobility  Abnormal posture  Other  symptoms and signs involving the musculoskeletal system  Muscle weakness (generalized)  Unsteadiness on feet     Problem List There are no active problems to display for this patient.   Jamey Reas 01/30/2017, 6:20 AM PT, DPT  [facility] [address] [place], [place], [address] Phone: [phone]   Fax:  [phone]  Name: Brian [name] MRN: [id number] Date of Birth: [birth year]

## 2017-02-03 ENCOUNTER — Ambulatory Visit: Payer: Medicare Other | Admitting: Physical Therapy

## 2017-02-03 ENCOUNTER — Encounter: Payer: Self-pay | Admitting: Physical Therapy

## 2017-02-03 DIAGNOSIS — R293 Abnormal posture: Secondary | ICD-10-CM

## 2017-02-03 DIAGNOSIS — R2689 Other abnormalities of gait and mobility: Secondary | ICD-10-CM

## 2017-02-03 DIAGNOSIS — M79605 Pain in left leg: Secondary | ICD-10-CM

## 2017-02-03 DIAGNOSIS — R2681 Unsteadiness on feet: Secondary | ICD-10-CM

## 2017-02-03 DIAGNOSIS — R29898 Other symptoms and signs involving the musculoskeletal system: Secondary | ICD-10-CM

## 2017-02-03 DIAGNOSIS — M6281 Muscle weakness (generalized): Secondary | ICD-10-CM

## 2017-02-03 NOTE — Therapy (Signed)
[facility] [address] [place], [place], [address] Phone: [phone]   Fax:  [phone]  Physical Therapy Treatment  Patient Details  Name: Brian [name] MRN: [id number] Date of Birth: 04-14-1931 Referring Provider: Benjamine Sprague, MD   Encounter Date: 02/03/2017  PT End of Session - 02/03/17 1617    Visit Number  6    Number of Visits  17    Date for PT Re-Evaluation  03/13/17    Authorization Type  Medicare G-code & Progress note every 10th visit    PT Start Time  1445    PT Stop Time  1530    PT Time Calculation (min)  45 min    Equipment Utilized During Treatment  Gait belt    Activity Tolerance  Patient tolerated treatment well;Patient limited by pain    Behavior During Therapy  [facility] for tasks assessed/performed       History reviewed. No pertinent past medical history.  History reviewed. No pertinent surgical history.  There were no vitals filed for this visit.  Subjective Assessment - 02/03/17 1449    Subjective  He has been using cane with quad tip and it seems to be helping. He has been checking on rollator walker.     Pertinent History  restrictive lung disease, HTN, DM, OA, chronic LBP, left knee pain, right knee replacement 2010, peripheral neuropathy,     Limitations  Lifting;Standing;Walking;House hold activities    Patient Stated Goals  Improve balance, walk with dexterity,     Currently in Pain?  Yes    Pain Score  5     Pain Location  Knee    Pain Orientation  Left    Pain Descriptors / Indicators  Aching    Pain Type  Chronic pain    Pain Onset  More than a month ago    Pain Frequency  Constant    Aggravating Factors   standing & walking    Pain Relieving Factors  sit or lay down                      [facility] Adult PT Treatment/Exercise - 02/03/17 1445      Transfers   Transfers  Sit to Stand;Stand to [employer] Transfers    Sit to Stand  5: Supervision;With  upper extremity assist;With armrests;From chair/3-in-1 to rollator    Sit to Stand Details  Visual cues for safe use of DME/AE    Stand to Sit  5: Supervision;With upper extremity assist;With armrests;To chair/3-in-1 from rollator    Stand to Sit Details (indicate cue type and reason)  Verbal cues for safe use of DME/AE    Stand Pivot Transfers  5: Supervision turning 180* to sit/stand from rollator seat    Stand Pivot Transfer Details (indicate cue type and reason)  verbal cues on rollator management      Ambulation/Gait   Ambulation/Gait  Yes    Ambulation/Gait Assistance  5: Supervision    Ambulation/Gait Assistance Details  verbal cues to stay closer to rollator for control    Ambulation Distance (Feet)  250 Feet 250' rollator, 100' X 2 cane    Assistive device  Rollator;Straight cane quad tip on cane    Ambulation Surface  Indoor;Level    Ramp  5: Supervision rollator    Ramp Details (indicate cue type and reason)  verbal cues on rollator management    Curb  5: Supervision rollator    Curb  Details (indicate cue type and reason)  verbal cues on rollator management      Exercises   Exercises  Knee/Hip      Knee/Hip Exercises: Standing   Heel Raises  Both;2 sets;5 reps;2 seconds    Heel Raises Limitations  UE support, using function of looking onto higher shelf or reaching up to facilitate more natural LE joint coordination of movement.    Terminal Knee Extension  Right;Left;Strengthening;3 sets;10 reps;Theraband 1 set each foot position (even, step forward, step back)    Theraband Level (Terminal Knee Extension)  Level 2 (Red)    Terminal Knee Extension Limitations  anterior & posterior band for resistance.              PT Education - 02/03/17 1500    Education provided  Yes    Education Details  adjusting assistive devices for environment, distance of gait & protecting knees from increased pain, rollator for grass, uneven terrain, longer distances including walking program     Person(s) Educated  Patient    Methods  Explanation;Verbal cues    Comprehension  Verbalized understanding;Need further instruction       PT Short Term Goals - 01/27/17 1626      PT SHORT TERM GOAL #1   Title  Patient demonstrates understanding of initial HEP.     Time  4    Period  Weeks    Status  On-going    Target Date  02/11/17      PT SHORT TERM GOAL #2   Title  Patient verbalizes understanding of fall prevention strategies.     Time  4    Period  Weeks    Status  On-going    Target Date  02/11/17      PT SHORT TERM GOAL #3   Title  Patient ambulates 300' with LRAD with supervision.     Time  4    Period  Weeks    Status  On-going    Target Date  02/11/17        PT Long Term Goals - 01/27/17 1627      PT LONG TERM GOAL #1   Title  Patient demonstrates & verbalizes ongoing fitness plan / HEP.  (All LTGs Target Date: 03/13/17)    Time  8    Period  Weeks    Status  On-going    Target Date  03/13/17      PT LONG TERM GOAL #2   Title  Berg Balance >36/56 to indicate lower fall risk.     Time  8    Period  Weeks    Status  On-going    Target Date  03/13/17      PT LONG TERM GOAL #3   Title  Patient ambulates with LRAD for balance & pain >880 feet in 6 minutes (6 min walk test) modified independent.     Time  8    Period  Weeks    Status  On-going    Target Date  03/13/17      PT LONG TERM GOAL #4   Title  Assess dizziness / vestibular & set LTG    Baseline  achieved; no specific vestibular goal needed. Pt demonstrates decreased use of vestibular and somatosensory systems for balance, increased use of visual systems    Status  Achieved      PT LONG TERM GOAL #5   Title  Patient negotiates ramps, curbs & 10 stairs (forwards to ascend, backwards  to descend with R rail to mimic attic entry) with LRAD modified independent.     Time  8    Period  Weeks    Status  On-going    Target Date  03/13/17      PT LONG TERM GOAL #6   Title  Patient reports pain  increases <2 increments on 0-10 scale.     Time  8    Period  Weeks    Status  On-going    Target Date  03/13/17      PT LONG TERM GOAL #7   Title  Activities of Balance score >55%    Time  8    Period  Weeks    Status  On-going    Target Date  03/13/17            Plan - 02/03/17 1617    Clinical Impression Statement  Patient appears to have better understanding of adjusting walking devices to maximize moblity & minimize pain/fatigue/falls.  Patient needs further instruction on using rollator on ramps & curbs.     Rehab Potential  Good    Clinical Impairments Affecting Rehab Potential  restrictive lung disease, HTN, DM, OA, chronic LBP, left knee pain, right knee replacement 2010, peripheral neuropathy,     PT Frequency  2x / week    PT Duration  8 weeks    PT Treatment/Interventions  ADLs/Self Care Home Management;DME Instruction;Gait training;Stair training;Functional mobility training;Therapeutic activities;Therapeutic exercise;Balance training;Neuromuscular re-education;Patient/family education;Vestibular    PT Next Visit Plan  gait training with cane including scanning & negotiating obstacles, ambulate with rollator for endurance walking program, review negotiating ramps & curbs with rollator    Consulted and Agree with Plan of Care  Patient       Patient will benefit from skilled therapeutic intervention in order to improve the following deficits and impairments:  Abnormal gait, Decreased activity tolerance, Decreased balance, Decreased endurance, Decreased knowledge of use of DME, Decreased mobility, Decreased strength, Dizziness, Postural dysfunction, Impaired flexibility  Visit Diagnosis: Other abnormalities of gait and mobility  Abnormal posture  Other symptoms and signs involving the musculoskeletal system  Muscle weakness (generalized)  Unsteadiness on feet  Pain in left leg     Problem List There are no active problems to display for this  patient.   Jamey Reas PT, DPT 02/03/2017, 4:20 PM  [facility] [address] [place], [place], [address] Phone: [phone]   Fax:  [phone]  Name: Brian [name] MRN: [id number] Date of Birth: 03-23-1932

## 2017-02-05 ENCOUNTER — Encounter: Payer: Self-pay | Admitting: Physical Therapy

## 2017-02-05 ENCOUNTER — Ambulatory Visit: Payer: Medicare Other | Admitting: Physical Therapy

## 2017-02-05 DIAGNOSIS — R293 Abnormal posture: Secondary | ICD-10-CM

## 2017-02-05 DIAGNOSIS — R2689 Other abnormalities of gait and mobility: Secondary | ICD-10-CM | POA: Diagnosis not present

## 2017-02-05 DIAGNOSIS — R29898 Other symptoms and signs involving the musculoskeletal system: Secondary | ICD-10-CM

## 2017-02-05 DIAGNOSIS — M6281 Muscle weakness (generalized): Secondary | ICD-10-CM

## 2017-02-05 DIAGNOSIS — M79605 Pain in left leg: Secondary | ICD-10-CM

## 2017-02-05 DIAGNOSIS — R2681 Unsteadiness on feet: Secondary | ICD-10-CM

## 2017-02-05 NOTE — Therapy (Signed)
[facility] [address] [place], [place], [address] Phone: [phone]   Fax:  [phone]  Physical Therapy Treatment  Patient Details  Name: Brian [name] MRN: [id number] Date of Birth: [birth year] Referring Provider: Benjamine Sprague, MD   Encounter Date: 02/05/2017  PT End of Session - 02/05/17 1621    Visit Number  7    Number of Visits  17    Date for PT Re-Evaluation  03/13/17    Authorization Type  Medicare G-code & Progress note every 10th visit    PT Start Time  1447    PT Stop Time  1530    PT Time Calculation (min)  43 min    Equipment Utilized During Treatment  Gait belt    Activity Tolerance  Patient tolerated treatment well;Patient limited by pain    Behavior During Therapy  [facility] for tasks assessed/performed       History reviewed. No pertinent past medical history.  History reviewed. No pertinent surgical history.  There were no vitals filed for this visit.  Subjective Assessment - 02/05/17 1453    Subjective  No new complaints, no falls or pain to report. "First time L knee has not hurt in a very long time".    Pertinent History  restrictive lung disease, HTN, DM, OA, chronic LBP, left knee pain, right knee replacement 2010, peripheral neuropathy,     Limitations  Lifting;Standing;Walking;House hold activities    Patient Stated Goals  Improve balance, walk with dexterity,     Currently in Pain?  No/denies       [facility] Adult PT Treatment/Exercise - 02/05/17 1534      Ambulation/Gait   Ambulation/Gait  Yes    Ambulation/Gait Assistance  4: Min assist;4: Min guard    Ambulation/Gait Assistance Details  Pt ambulating 1 lap around blue circle then continuing to the hall with cane. Pt in the hall performing head movements: head turns, head nods, diagonals both ways. Pt demonstrated signifigant drift both ways with head turns, and decreased ambulation speed while performing head nods/diagonals.      Ambulation Distance (Feet)  500 Feet    Assistive device  Other (Comment) Hurry Cane    Gait Pattern  Narrow base of support;Trunk flexed;Poor foot clearance - left;Decreased stride length    Ramp  Other (comment);5: Supervision with Hurry Cane    Ramp Details (indicate cue type and reason)  Pt ascending/descending ramp with cane x5 with cues for technique and sequencing. Pt demonstrated minimal instability with this task.    Curb  4: Min assist    Curb Details (indicate cue type and reason)  Pt ascending/descending curb x7 with cane, pt required cues for technique and sequencing with cane. Pt demonstrated some instability initially but instability decreased with practice. Pt would benefit from continued practice with cane.     Gait Comments  Performed gait obstacle negotiation training at counter top for increased support.  Pt standing at counter for support with single UE on counter and the opposite UE on the cane. Pt stepping over small orange hurdles with a step to pattern. Pt demonstrated signifigant toeing out on the LLE which caused the pt to continuously step on his right heel with the left heel, pt cued to focus on keeping LLE pointed straight ahead. The pt then progressed to single UE support, first with the counter and then with the cane. The pt seemed more nervous than unstable, he tolerated this well with some instability, cues  for posture and to look straight ahead with activity.            --      Exercises   Exercises  Ankle      Ankle Exercises: Standing   Heel Raises  5 reps    Heel Raises Limitations  Pt standing at counter performing heel raises while reaching up with opposite UE. Cues to raise heels higher and to breathe. Pt having signifigant trouble lifting heels off the ground.         PT Short Term Goals - 01/27/17 1626      PT SHORT TERM GOAL #1   Title  Patient demonstrates understanding of initial HEP.     Time  4    Period  Weeks    Status  On-going    Target  Date  02/11/17      PT SHORT TERM GOAL #2   Title  Patient verbalizes understanding of fall prevention strategies.     Time  4    Period  Weeks    Status  On-going    Target Date  02/11/17      PT SHORT TERM GOAL #3   Title  Patient ambulates 300' with LRAD with supervision.     Time  4    Period  Weeks    Status  On-going    Target Date  02/11/17        PT Long Term Goals - 01/27/17 1627      PT LONG TERM GOAL #1   Title  Patient demonstrates & verbalizes ongoing fitness plan / HEP.  (All LTGs Target Date: 03/13/17)    Time  8    Period  Weeks    Status  On-going    Target Date  03/13/17      PT LONG TERM GOAL #2   Title  Berg Balance >36/56 to indicate lower fall risk.     Time  8    Period  Weeks    Status  On-going    Target Date  03/13/17      PT LONG TERM GOAL #3   Title  Patient ambulates with LRAD for balance & pain >880 feet in 6 minutes (6 min walk test) modified independent.     Time  8    Period  Weeks    Status  On-going    Target Date  03/13/17      PT LONG TERM GOAL #4   Title  Assess dizziness / vestibular & set LTG    Baseline  achieved; no specific vestibular goal needed. Pt demonstrates decreased use of vestibular and somatosensory systems for balance, increased use of visual systems    Status  Achieved      PT LONG TERM GOAL #5   Title  Patient negotiates ramps, curbs & 10 stairs (forwards to ascend, backwards to descend with R rail to mimic attic entry) with LRAD modified independent.     Time  8    Period  Weeks    Status  On-going    Target Date  03/13/17      PT LONG TERM GOAL #6   Title  Patient reports pain increases <2 increments on 0-10 scale.     Time  8    Period  Weeks    Status  On-going    Target Date  03/13/17      PT LONG TERM GOAL #7   Title  Activities of Balance score >  55%    Time  8    Period  Weeks    Status  On-going    Target Date  03/13/17       Plan - 02/05/17 1624    Clinical Impression Statement  Pt  tolerated treatment well with no limitations due to fatigue or weakness. Todays session focused on Gait training with cane [employer]) and LE strengthening. Pt could use further instruction using cane to ascend/descend curb. Pt would benefit from continued PT sessions to progress towards goals.     Rehab Potential  Good    Clinical Impairments Affecting Rehab Potential  restrictive lung disease, HTN, DM, OA, chronic LBP, left knee pain, right knee replacement 2010, peripheral neuropathy,     PT Frequency  2x / week    PT Duration  8 weeks    PT Treatment/Interventions  ADLs/Self Care Home Management;DME Instruction;Gait training;Stair training;Functional mobility training;Therapeutic activities;Therapeutic exercise;Balance training;Neuromuscular re-education;Patient/family education;Vestibular    PT Next Visit Plan  STG due by 11/21; gait training with cane including negotiating obstacles, stairs. Continue with curb training using the cane. LE strengthening and balance training.     Consulted and Agree with Plan of Care  Patient       Patient will benefit from skilled therapeutic intervention in order to improve the following deficits and impairments:  Abnormal gait, Decreased activity tolerance, Decreased balance, Decreased endurance, Decreased knowledge of use of DME, Decreased mobility, Decreased strength, Dizziness, Postural dysfunction, Impaired flexibility  Visit Diagnosis: Other abnormalities of gait and mobility  Abnormal posture  Other symptoms and signs involving the musculoskeletal system  Muscle weakness (generalized)  Unsteadiness on feet  Pain in left leg     Problem List There are no active problems to display for this patient.  Zalika Tieszen, SPTA  Arsen Mangione 02/05/2017, 4:28 PM  [facility] [address] [place], [place], [address] Phone: [phone]   Fax:  [phone]  Name: CHAYIM BIALAS MRN: [id number] Date of Birth: October 26, 1931

## 2017-02-09 ENCOUNTER — Ambulatory Visit: Payer: Medicare Other | Admitting: Physical Therapy

## 2017-02-09 ENCOUNTER — Encounter: Payer: Self-pay | Admitting: Physical Therapy

## 2017-02-09 DIAGNOSIS — R293 Abnormal posture: Secondary | ICD-10-CM

## 2017-02-09 DIAGNOSIS — M6281 Muscle weakness (generalized): Secondary | ICD-10-CM

## 2017-02-09 DIAGNOSIS — R29898 Other symptoms and signs involving the musculoskeletal system: Secondary | ICD-10-CM

## 2017-02-09 DIAGNOSIS — R2689 Other abnormalities of gait and mobility: Secondary | ICD-10-CM | POA: Diagnosis not present

## 2017-02-09 DIAGNOSIS — R2681 Unsteadiness on feet: Secondary | ICD-10-CM

## 2017-02-09 DIAGNOSIS — M79604 Pain in right leg: Secondary | ICD-10-CM

## 2017-02-09 NOTE — Therapy (Signed)
[facility] [address] [place], [place], [address] Phone: [phone]   Fax:  [phone]  Physical Therapy Treatment  Patient Details  Name: Brian [name] MRN: [id number] Date of Birth: 12/06/1931 Referring Provider: Benjamine Sprague, MD   Encounter Date: 02/09/2017  PT End of Session - 02/09/17 2300    Visit Number  8    Number of Visits  17    Date for PT Re-Evaluation  03/13/17    Authorization Type  Medicare G-code & Progress note every 10th visit    PT Start Time  1446    PT Stop Time  1530    PT Time Calculation (min)  44 min    Equipment Utilized During Treatment  Gait belt    Activity Tolerance  Patient tolerated treatment well;Patient limited by pain    Behavior During Therapy  [facility] for tasks assessed/performed       History reviewed. No pertinent past medical history.  History reviewed. No pertinent surgical history.  There were no vitals filed for this visit.  Subjective Assessment - 02/09/17 1451    Subjective  No falls. His left knee has been feeling better but only when pushes things.     Pertinent History  restrictive lung disease, HTN, DM, OA, chronic LBP, left knee pain, right knee replacement 2010, peripheral neuropathy,     Limitations  Lifting;Standing;Walking;House hold activities    Patient Stated Goals  Improve balance, walk with dexterity,     Currently in Pain?  No/denies                      [facility] Adult PT Treatment/Exercise - 02/09/17 1445      Transfers   Transfers  Sit to Stand;Stand to Sit    Sit to Stand  5: Supervision;From elevated surface;Without upper extremity assist from 24" stool      Ambulation/Gait   Ambulation/Gait  Yes    Ambulation/Gait Assistance  5: Supervision    Ambulation/Gait Assistance Details  verbal cues on upright posture, working on maintaining path & pace with scanning    Ambulation Distance (Feet)  400 Feet    Assistive device   Straight cane quad tip    Ambulation Surface  Indoor;Level    Ramp  5: Supervision cane with quad tip    Ramp Details (indicate cue type and reason)  verbal cues on technique with posture & wt shift    Curb  5: Supervision cane with quad tip    Curb Details (indicate cue type and reason)  verbal & demo cues on step thru for balance reactions      Neuro Re-ed    Neuro Re-ed Details   seated on 24" stool: anterior trunk lean with back extension/ scapular retraction /upright posture. 5reps      Knee/Hip Exercises: Standing   Terminal Knee Extension  Right;Left;Strengthening;10 reps;Theraband    Theraband Level (Terminal Knee Extension)  Level 2 (Red)    Terminal Knee Extension Limitations  anterior & posterior band for resistance.           Balance Exercises - 02/09/17 1445      Balance Exercises: Standing   Standing Eyes Opened  Wide (BOA);Head turns;Foam/compliant surface;5 reps    Standing Eyes Closed  Wide (BOA);Solid surface;2 reps;10 secs static stance    Stepping Strategy  Anterior;Posterior;5 reps on foam beam & standing on ramp (uphill & downhill)          PT  Short Term Goals - 02/09/17 2300      PT SHORT TERM GOAL #1   Title  Patient demonstrates understanding of initial HEP.     Baseline  MET 02/09/2017    Time  4    Period  Weeks    Status  Achieved      PT SHORT TERM GOAL #2   Title  Patient verbalizes understanding of fall prevention strategies.     Baseline  MET 02/09/2017    Time  4    Period  Weeks    Status  Achieved      PT SHORT TERM GOAL #3   Title  Patient ambulates 300' with LRAD with supervision.     Baseline  MET 02/09/2017 with cane    Time  4    Period  Weeks    Status  Achieved        PT Long Term Goals - 01/27/17 1627      PT LONG TERM GOAL #1   Title  Patient demonstrates & verbalizes ongoing fitness plan / HEP.  (All LTGs Target Date: 03/13/17)    Time  8    Period  Weeks    Status  On-going    Target Date  03/13/17       PT LONG TERM GOAL #2   Title  Berg Balance >36/56 to indicate lower fall risk.     Time  8    Period  Weeks    Status  On-going    Target Date  03/13/17      PT LONG TERM GOAL #3   Title  Patient ambulates with LRAD for balance & pain >880 feet in 6 minutes (6 min walk test) modified independent.     Time  8    Period  Weeks    Status  On-going    Target Date  03/13/17      PT LONG TERM GOAL #4   Title  Assess dizziness / vestibular & set LTG    Baseline  achieved; no specific vestibular goal needed. Pt demonstrates decreased use of vestibular and somatosensory systems for balance, increased use of visual systems    Status  Achieved      PT LONG TERM GOAL #5   Title  Patient negotiates ramps, curbs & 10 stairs (forwards to ascend, backwards to descend with R rail to mimic attic entry) with LRAD modified independent.     Time  8    Period  Weeks    Status  On-going    Target Date  03/13/17      PT LONG TERM GOAL #6   Title  Patient reports pain increases <2 increments on 0-10 scale.     Time  8    Period  Weeks    Status  On-going    Target Date  03/13/17      PT LONG TERM GOAL #7   Title  Activities of Balance score >55%    Time  8    Period  Weeks    Status  On-going    Target Date  03/13/17            Plan - 02/09/17 2302    Clinical Impression Statement  Patient met 3 of 3 STGs. Patient has improved balance reactions with skilled PT instructions to facilitate ankle, hip & step strategies.     Rehab Potential  Good    Clinical Impairments Affecting Rehab Potential  restrictive lung disease, HTN,  DM, OA, chronic LBP, left knee pain, right knee replacement 2010, peripheral neuropathy,     PT Frequency  2x / week    PT Duration  8 weeks    PT Treatment/Interventions  ADLs/Self Care Home Management;DME Instruction;Gait training;Stair training;Functional mobility training;Therapeutic activities;Therapeutic exercise;Balance training;Neuromuscular  re-education;Patient/family education;Vestibular    PT Next Visit Plan  gait training with cane including negotiating obstacles, stairs. Continue with curb training using the cane. LE strengthening and balance training.     Consulted and Agree with Plan of Care  Patient       Patient will benefit from skilled therapeutic intervention in order to improve the following deficits and impairments:  Abnormal gait, Decreased activity tolerance, Decreased balance, Decreased endurance, Decreased knowledge of use of DME, Decreased mobility, Decreased strength, Dizziness, Postural dysfunction, Impaired flexibility  Visit Diagnosis: Other abnormalities of gait and mobility  Abnormal posture  Other symptoms and signs involving the musculoskeletal system  Muscle weakness (generalized)  Unsteadiness on feet  Pain in right leg     Problem List There are no active problems to display for this patient.   Jamey Reas PT, DPT 02/09/2017, 11:13 PM  [facility] [address] [place], [place], [address] Phone: [phone]   Fax:  [phone]  Name: Brian [name] MRN: [id number] Date of Birth: 15-May-1931

## 2017-02-11 ENCOUNTER — Encounter: Payer: Self-pay | Admitting: Physical Therapy

## 2017-02-11 ENCOUNTER — Ambulatory Visit: Payer: Medicare Other | Admitting: Physical Therapy

## 2017-02-11 DIAGNOSIS — M79605 Pain in left leg: Secondary | ICD-10-CM

## 2017-02-11 DIAGNOSIS — M79604 Pain in right leg: Secondary | ICD-10-CM

## 2017-02-11 DIAGNOSIS — R2681 Unsteadiness on feet: Secondary | ICD-10-CM

## 2017-02-11 DIAGNOSIS — R2689 Other abnormalities of gait and mobility: Secondary | ICD-10-CM | POA: Diagnosis not present

## 2017-02-11 DIAGNOSIS — R293 Abnormal posture: Secondary | ICD-10-CM

## 2017-02-11 DIAGNOSIS — M6281 Muscle weakness (generalized): Secondary | ICD-10-CM

## 2017-02-11 DIAGNOSIS — R29898 Other symptoms and signs involving the musculoskeletal system: Secondary | ICD-10-CM

## 2017-02-11 NOTE — Therapy (Signed)
[facility] [address] [place], [place], [address] Phone: [phone]   Fax:  [phone]  Physical Therapy Treatment  Patient Details  Name: Brian [name] MRN: [id number] Date of Birth: Jan 30, 1932 Referring Provider: Benjamine Sprague, MD   Encounter Date: 02/11/2017  PT End of Session - 02/11/17 1948    Visit Number  9    Number of Visits  17    Date for PT Re-Evaluation  03/13/17    Authorization Type  Medicare G-code & Progress note every 10th visit    PT Start Time  1536    PT Stop Time  1615    PT Time Calculation (min)  39 min    Activity Tolerance  Patient tolerated treatment well    Behavior During Therapy  [facility] for tasks assessed/performed       History reviewed. No pertinent past medical history.  History reviewed. No pertinent surgical history.  There were no vitals filed for this visit.  Subjective Assessment - 02/11/17 1540    Subjective  Knee continues to feel better; hips feel sore.  Did a lot of cleaning up around the house yesterday; is fatigued today.    Pertinent History  restrictive lung disease, HTN, DM, OA, chronic LBP, left knee pain, right knee replacement 2010, peripheral neuropathy,     Limitations  Lifting;Standing;Walking;House hold activities    Patient Stated Goals  Improve balance, walk with dexterity,     Currently in Pain?  Yes    Pain Score  2     Pain Location  Hip    Pain Orientation  Right;Left    Pain Descriptors / Indicators  Sore    Pain Type  Acute pain    Pain Onset  Yesterday                      [facility] Adult PT Treatment/Exercise - 02/11/17 1553      Ambulation/Gait   Stairs  Yes    Stairs Assistance  5: Supervision    Stairs Assistance Details (indicate cue type and reason)  reviewed sequence to access attic    Stair Management Technique  One rail Right;Step to pattern;Forwards;Backwards    Number of Stairs  8    Height of Stairs  6    Curb   5: Supervision    Curb Details (indicate cue type and reason)  multiple repetitions focusing on safe technique keeping cane in LUE, ascending with RLE, descending with LLE          Balance Exercises - 02/11/17 1554      Balance Exercises: Standing   Wall Bumps  Hip    Wall Bumps-Hips  Eyes opened;Anterior/posterior;Foam/compliant surface;5 reps 3 sets with UE support on chair with cues for hip activation    Step Over Hurdles / Cones  stepping over 4 low obstacles with one UE support on cane, one UE support on counter top: 2 sets x 4 reps forwards, R and L lateral, retro with supervision-min A        PT Education - 02/11/17 1948    Education provided  Yes    Education Details  curb and stair negotiation, obstacle negotiation    Person(s) Educated  Patient    Methods  Explanation    Comprehension  Need further instruction       PT Short Term Goals - 02/09/17 2300      PT SHORT TERM GOAL #1   Title  Patient  demonstrates understanding of initial HEP.     Baseline  MET 02/09/2017    Time  4    Period  Weeks    Status  Achieved      PT SHORT TERM GOAL #2   Title  Patient verbalizes understanding of fall prevention strategies.     Baseline  MET 02/09/2017    Time  4    Period  Weeks    Status  Achieved      PT SHORT TERM GOAL #3   Title  Patient ambulates 300' with LRAD with supervision.     Baseline  MET 02/09/2017 with cane    Time  4    Period  Weeks    Status  Achieved        PT Long Term Goals - 01/27/17 1627      PT LONG TERM GOAL #1   Title  Patient demonstrates & verbalizes ongoing fitness plan / HEP.  (All LTGs Target Date: 03/13/17)    Time  8    Period  Weeks    Status  On-going    Target Date  03/13/17      PT LONG TERM GOAL #2   Title  Berg Balance >36/56 to indicate lower fall risk.     Time  8    Period  Weeks    Status  On-going    Target Date  03/13/17      PT LONG TERM GOAL #3   Title  Patient ambulates with LRAD for balance & pain >880  feet in 6 minutes (6 min walk test) modified independent.     Time  8    Period  Weeks    Status  On-going    Target Date  03/13/17      PT LONG TERM GOAL #4   Title  Assess dizziness / vestibular & set LTG    Baseline  achieved; no specific vestibular goal needed. Pt demonstrates decreased use of vestibular and somatosensory systems for balance, increased use of visual systems    Status  Achieved      PT LONG TERM GOAL #5   Title  Patient negotiates ramps, curbs & 10 stairs (forwards to ascend, backwards to descend with R rail to mimic attic entry) with LRAD modified independent.     Time  8    Period  Weeks    Status  On-going    Target Date  03/13/17      PT LONG TERM GOAL #6   Title  Patient reports pain increases <2 increments on 0-10 scale.     Time  8    Period  Weeks    Status  On-going    Target Date  03/13/17      PT LONG TERM GOAL #7   Title  Activities of Balance score >55%    Time  8    Period  Weeks    Status  On-going    Target Date  03/13/17            Plan - 02/11/17 1949    Clinical Impression Statement  Treatment session continued to focus on review of curb, stair and obstacle negotiation addressed with pt two sessions ago.  Pt continued to require verbal and visual cues for safe curb negotiation with cane.  Progressed obstacle negotiation to include stepping over obstacles laterally and retro.  Also progressed hip strategy and balance reaction training with use of compliant balance beam.  Pt tolerated  well with no increase in hip pain and only mild knee pain after curb and stair negotiation.  Will continue to address and progress towards LTG.    Rehab Potential  Good    Clinical Impairments Affecting Rehab Potential  restrictive lung disease, HTN, DM, OA, chronic LBP, left knee pain, right knee replacement 2010, peripheral neuropathy,     PT Frequency  2x / week    PT Duration  8 weeks    PT Treatment/Interventions  ADLs/Self Care Home Management;DME  Instruction;Gait training;Stair training;Functional mobility training;Therapeutic activities;Therapeutic exercise;Balance training;Neuromuscular re-education;Patient/family education;Vestibular    PT Next Visit Plan  10th visit G code and PN!  balance training on compliant surfaces, compliant balance beam-ankle, hip and stepping strategies    Consulted and Agree with Plan of Care  Patient       Patient will benefit from skilled therapeutic intervention in order to improve the following deficits and impairments:  Abnormal gait, Decreased activity tolerance, Decreased balance, Decreased endurance, Decreased knowledge of use of DME, Decreased mobility, Decreased strength, Dizziness, Postural dysfunction, Impaired flexibility  Visit Diagnosis: Other abnormalities of gait and mobility  Pain in right leg  Pain in left leg  Abnormal posture  Other symptoms and signs involving the musculoskeletal system  Muscle weakness (generalized)  Unsteadiness on feet     Problem List There are no active problems to display for this patient.   Rico Junker, PT, DPT 02/11/17    7:56 PM    [facility] [address] [place], [place], [address] Phone: [phone]   Fax:  [phone]  Name: Brian [name] MRN: [id number] Date of Birth: 09/30/1931

## 2017-02-16 ENCOUNTER — Ambulatory Visit: Payer: Medicare Other | Admitting: Physical Therapy

## 2017-02-16 ENCOUNTER — Encounter: Payer: Self-pay | Admitting: Physical Therapy

## 2017-02-16 DIAGNOSIS — R2681 Unsteadiness on feet: Secondary | ICD-10-CM

## 2017-02-16 DIAGNOSIS — R29898 Other symptoms and signs involving the musculoskeletal system: Secondary | ICD-10-CM

## 2017-02-16 DIAGNOSIS — M6281 Muscle weakness (generalized): Secondary | ICD-10-CM

## 2017-02-16 DIAGNOSIS — M79604 Pain in right leg: Secondary | ICD-10-CM

## 2017-02-16 DIAGNOSIS — R2689 Other abnormalities of gait and mobility: Secondary | ICD-10-CM | POA: Diagnosis not present

## 2017-02-16 DIAGNOSIS — M79605 Pain in left leg: Secondary | ICD-10-CM

## 2017-02-16 DIAGNOSIS — R293 Abnormal posture: Secondary | ICD-10-CM

## 2017-02-17 NOTE — Therapy (Signed)
[facility] [address] [place], [place], [address] Phone: [phone]   Fax:  [phone]  Physical Therapy Treatment  Patient Details  Name: Brian [name] MRN: [id number] Date of Birth: 1931-08-14 Referring Provider: Benjamine Sprague, MD   Encounter Date: 02/16/2017    History reviewed. No pertinent past medical history.  History reviewed. No pertinent surgical history.  There were no vitals filed for this visit.  Subjective Assessment - 02/16/17 1449    Subjective  No falls. He has been doing his exercised.     Pertinent History  restrictive lung disease, HTN, DM, OA, chronic LBP, left knee pain, right knee replacement 2010, peripheral neuropathy,     Limitations  Lifting;Standing;Walking;House hold activities    Patient Stated Goals  Improve balance, walk with dexterity,     Currently in Pain?  Yes    Pain Score  2     Pain Location  Knee    Pain Orientation  Left    Pain Descriptors / Indicators  Aching    Pain Type  Chronic pain    Pain Onset  More than a month ago    Pain Frequency  Intermittent    Aggravating Factors   hurt at 8/10 this morning.    Pain Relieving Factors  sit down         [facility] PT Assessment - 02/16/17 1445      Berg Balance Test   Sit to Stand  Able to stand without using hands and stabilize independently    Standing Unsupported  Able to stand safely 2 minutes    Sitting with Back Unsupported but Feet Supported on Floor or Stool  Able to sit safely and securely 2 minutes    Stand to Sit  Sits safely with minimal use of hands    Transfers  Able to transfer safely, minor use of hands    Standing Unsupported with Eyes Closed  Able to stand 10 seconds safely    Standing Ubsupported with Feet Together  Able to place feet together independently and stand for 1 minute with supervision    From Standing, Reach Forward with Outstretched Arm  Can reach forward >12 cm safely (5")    From  Standing Position, Pick up Object from Floor  Able to pick up shoe, needs supervision    From Standing Position, Turn to Look Behind Over each Shoulder  Turn sideways only but maintains balance    Turn 360 Degrees  Needs close supervision or verbal cueing    Standing Unsupported, Alternately Place Feet on Step/Stool  Needs assistance to keep from falling or unable to try    Standing Unsupported, One Foot in Front  Needs help to step but can hold 15 seconds    Standing on One Leg  Tries to lift leg/unable to hold 3 seconds but remains standing independently    Total Score  38    Berg comment:  Initial was 24/56                  [facility] Adult PT Treatment/Exercise - 02/16/17 1445      Transfers   Transfers  Sit to Stand;Stand to Sit    Sit to Stand  5: Supervision;With upper extremity assist;From chair/3-in-1 chairs without armrests using UEs      Ambulation/Gait   Ambulation/Gait  Yes    Ambulation/Gait Assistance  5: Supervision    Ambulation/Gait Assistance Details  worked on [place].  Ambulation Distance (Feet)  400 Feet    Assistive device  Straight cane quad tip    Ambulation Surface  Indoor;Level;Outdoor;Paved;Grass    Stairs  Yes    Stairs Assistance  5: Supervision    Stair Management Technique  Two rails;One rail Left;With cane;Alternating pattern;Forwards    Number of Stairs  4 3 reps    Ramp  5: Supervision cane with quad tip    Ramp Details (indicate cue type and reason)  cues on posture    Curb  5: Supervision cane with quad tip    Curb Details (indicate cue type and reason)  verbal cues on step length/ step thru and wt shift      High Level Balance   High Level Balance Activities  Side stepping;Backward walking;Direction changes;Head turns;Tandem walking;Marching forwards;Marching backwards counter top support    High Level Balance Comments  verbal & tactile cues on technique including posture & wt shift      Neuro Re-ed    Neuro  Re-ed Details   --      Knee/Hip Exercises: Standing   Terminal Knee Extension  --    Theraband Level (Terminal Knee Extension)  --    Terminal Knee Extension Limitations  --          Balance Exercises - 02/16/17 1445      Balance Exercises: Standing   Standing Eyes Opened  Wide (BOA);Foam/compliant surface;5 reps head turns & nods    Standing Eyes Closed  Wide (BOA);Foam/compliant surface;10 secs    Wall Bumps  Hip    Wall Bumps-Hips  Eyes opened;Anterior/posterior;5 reps    Gait with Head Turns  Upper extremity support;Forward;5 reps    Other Standing Exercises  Modified single leg stance with forefoot in lower cabinet: static, head turns, head flex/ext, then alt. tapping lower cabinet.           PT Short Term Goals - 02/09/17 2300      PT SHORT TERM GOAL #1   Title  Patient demonstrates understanding of initial HEP.     Baseline  MET 02/09/2017    Time  4    Period  Weeks    Status  Achieved      PT SHORT TERM GOAL #2   Title  Patient verbalizes understanding of fall prevention strategies.     Baseline  MET 02/09/2017    Time  4    Period  Weeks    Status  Achieved      PT SHORT TERM GOAL #3   Title  Patient ambulates 300' with LRAD with supervision.     Baseline  MET 02/09/2017 with cane    Time  4    Period  Weeks    Status  Achieved        PT Long Term Goals - 02/16/17 1828      PT LONG TERM GOAL #1   Title  Patient demonstrates & verbalizes ongoing fitness plan / HEP.  (All LTGs Target Date: 03/13/17)    Time  8    Period  Weeks    Status  On-going    Target Date  03/13/17      PT LONG TERM GOAL #2   Title  Berg Balance >42/56 to indicate lower fall risk.     Time  8    Period  Weeks    Status  Revised    Target Date  03/13/17      PT LONG TERM GOAL #3  Title  Patient ambulates with LRAD for balance & pain >1000 feet in 6 minutes (6 min walk test) modified independent.     Time  8    Period  Weeks    Status  Revised    Target Date   03/13/17      PT LONG TERM GOAL #4   Title  Assess dizziness / vestibular & set LTG    Baseline  achieved; no specific vestibular goal needed. Pt demonstrates decreased use of vestibular and somatosensory systems for balance, increased use of visual systems    Status  Achieved      PT LONG TERM GOAL #5   Title  Patient negotiates ramps, curbs & 10 stairs (forwards to ascend, backwards to descend with R rail to mimic attic entry) with LRAD modified independent.     Time  8    Period  Weeks    Status  On-going    Target Date  03/13/17      PT LONG TERM GOAL #6   Title  Patient reports pain increases <2 increments on 0-10 scale.     Time  8    Period  Weeks    Status  On-going    Target Date  03/13/17      PT LONG TERM GOAL #7   Title  Activities of Balance score >55%    Time  8    Period  Weeks    Status  On-going    Target Date  03/13/17            Plan - 03-04-17 1829    Clinical Impression Statement  Patient improved balance as indicated by Merrilee Jansky Balance score improving from 24/56 to 38/56. PT updated LTG for [employer]. Patient has improved balance with scanning while ambulating.     Rehab Potential  Good    Clinical Impairments Affecting Rehab Potential  restrictive lung disease, HTN, DM, OA, chronic LBP, left knee pain, right knee replacement 2010, peripheral neuropathy,     PT Frequency  2x / week    PT Duration  8 weeks    PT Treatment/Interventions  ADLs/Self Care Home Management;DME Instruction;Gait training;Stair training;Functional mobility training;Therapeutic activities;Therapeutic exercise;Balance training;Neuromuscular re-education;Patient/family education;Vestibular    PT Next Visit Plan  balance training on compliant surfaces, compliant balance beam-ankle, hip and stepping strategies    Consulted and Agree with Plan of Care  Patient       Patient will benefit from skilled therapeutic intervention in order to improve the following deficits and  impairments:  Abnormal gait, Decreased activity tolerance, Decreased balance, Decreased endurance, Decreased knowledge of use of DME, Decreased mobility, Decreased strength, Dizziness, Postural dysfunction, Impaired flexibility  Visit Diagnosis: Other abnormalities of gait and mobility  Pain in right leg  Pain in left leg  Abnormal posture  Other symptoms and signs involving the musculoskeletal system  Muscle weakness (generalized)  Unsteadiness on feet   G-Codes - 2017-03-04 1831    Functional Assessment Tool Used (Outpatient Only)  Berg Balance 38/56    Functional Limitation  Mobility: Walking and moving around    Mobility: Walking and Moving Around Current Status (F6213)  At least 40 percent but less than 60 percent impaired, limited or restricted    Mobility: Walking and Moving Around Goal Status [phone])  At least 20 percent but less than 40 percent impaired, limited or restricted       Problem List There are no active problems to display for this patient.  Physical Therapy Progress Note  Dates of Reporting Period: 01/14/2017 to 02/16/2017  Objective Reports of Subjective Statement: Patient reports improved balance & less arthritic pain limiting his mobility.   Objective Measurements: see above  Goal Update: see above  Plan:  Continue established plan.   Reason Skilled Services are Required: Patient requires skilled instruction to progress gait & balance with lower fall risk.    Bing Duffey PT, DPT 02/17/2017, 11:32 AM  [facility] [address] [place], [place], [address] Phone: [phone]   Fax:  [phone]  Name: Brian [name] MRN: [id number] Date of Birth: Nov 15, 1931

## 2017-02-18 ENCOUNTER — Encounter: Payer: Self-pay | Admitting: Physical Therapy

## 2017-02-18 ENCOUNTER — Ambulatory Visit: Payer: Medicare Other | Admitting: Physical Therapy

## 2017-02-18 DIAGNOSIS — R29898 Other symptoms and signs involving the musculoskeletal system: Secondary | ICD-10-CM

## 2017-02-18 DIAGNOSIS — M79605 Pain in left leg: Secondary | ICD-10-CM

## 2017-02-18 DIAGNOSIS — M79604 Pain in right leg: Secondary | ICD-10-CM

## 2017-02-18 DIAGNOSIS — R2689 Other abnormalities of gait and mobility: Secondary | ICD-10-CM

## 2017-02-18 DIAGNOSIS — R2681 Unsteadiness on feet: Secondary | ICD-10-CM

## 2017-02-18 DIAGNOSIS — R42 Dizziness and giddiness: Secondary | ICD-10-CM

## 2017-02-18 DIAGNOSIS — M6281 Muscle weakness (generalized): Secondary | ICD-10-CM

## 2017-02-18 DIAGNOSIS — R293 Abnormal posture: Secondary | ICD-10-CM

## 2017-02-18 NOTE — Therapy (Signed)
[facility] [address] [place], [place], [address] Phone: [phone]   Fax:  [phone]  Physical Therapy Treatment  Patient Details  Name: Brian [name] MRN: [id number] Date of Birth: 02/16/1932 Referring Provider: Benjamine Sprague, MD   Encounter Date: 02/18/2017  PT End of Session - 02/18/17 1605    Visit Number  11 updated to correct missed visit    Number of Visits  17    Date for PT Re-Evaluation  03/13/17    Authorization Type  Medicare G-code & Progress note every 10th visit    PT Start Time  1530    PT Stop Time  1609    PT Time Calculation (min)  39 min    Equipment Utilized During Treatment  Gait belt    Activity Tolerance  Patient tolerated treatment well    Behavior During Therapy  [facility] for tasks assessed/performed       History reviewed. No pertinent past medical history.  History reviewed. No pertinent surgical history.  There were no vitals filed for this visit.  Subjective Assessment - 02/18/17 1532    Subjective  doing well; no falls.  reports he's doing his HEP.    Pertinent History  restrictive lung disease, HTN, DM, OA, chronic LBP, left knee pain, right knee replacement 2010, peripheral neuropathy,     Patient Stated Goals  Improve balance, walk with dexterity,     Currently in Pain?  No/denies                      [facility] Adult PT Treatment/Exercise - 02/18/17 1545      Knee/Hip Exercises: Aerobic   Stepper  SciFit L3.5 x 8 min      Knee/Hip Exercises: Standing   Heel Raises  Both;20 reps    Heel Raises Limitations  with toe raises x 20 reps    Hip Flexion  Both;15 reps;Knee bent 2#    Hip Abduction  Both;15 reps;Knee straight 2#    Hip Extension  Both;15 reps;Knee straight 2#    Forward Step Up  Both;10 reps;Hand Hold: 2;Step Height: 4"          Balance Exercises - 02/18/17 1533      Balance Exercises: Standing   Balance Beam  intermittent UE support and  min A: horizontal/vertical head turns, forward taps alternating x 15 bil; sidestepping and tandem gait with UE support          PT Short Term Goals - 02/09/17 2300      PT SHORT TERM GOAL #1   Title  Patient demonstrates understanding of initial HEP.     Baseline  MET 02/09/2017    Time  4    Period  Weeks    Status  Achieved      PT SHORT TERM GOAL #2   Title  Patient verbalizes understanding of fall prevention strategies.     Baseline  MET 02/09/2017    Time  4    Period  Weeks    Status  Achieved      PT SHORT TERM GOAL #3   Title  Patient ambulates 300' with LRAD with supervision.     Baseline  MET 02/09/2017 with cane    Time  4    Period  Weeks    Status  Achieved        PT Long Term Goals - 02/16/17 1828      PT LONG TERM GOAL #1  Title  Patient demonstrates & verbalizes ongoing fitness plan / HEP.  (All LTGs Target Date: 03/13/17)    Time  8    Period  Weeks    Status  On-going    Target Date  03/13/17      PT LONG TERM GOAL #2   Title  Berg Balance >42/56 to indicate lower fall risk.     Time  8    Period  Weeks    Status  Revised    Target Date  03/13/17      PT LONG TERM GOAL #3   Title  Patient ambulates with LRAD for balance & pain >1000 feet in 6 minutes (6 min walk test) modified independent.     Time  8    Period  Weeks    Status  Revised    Target Date  03/13/17      PT LONG TERM GOAL #4   Title  Assess dizziness / vestibular & set LTG    Baseline  achieved; no specific vestibular goal needed. Pt demonstrates decreased use of vestibular and somatosensory systems for balance, increased use of visual systems    Status  Achieved      PT LONG TERM GOAL #5   Title  Patient negotiates ramps, curbs & 10 stairs (forwards to ascend, backwards to descend with R rail to mimic attic entry) with LRAD modified independent.     Time  8    Period  Weeks    Status  On-going    Target Date  03/13/17      PT LONG TERM GOAL #6   Title  Patient  reports pain increases <2 increments on 0-10 scale.     Time  8    Period  Weeks    Status  On-going    Target Date  03/13/17      PT LONG TERM GOAL #7   Title  Activities of Balance score >55%    Time  8    Period  Weeks    Status  On-going    Target Date  03/13/17            Plan - 02/18/17 1606    Clinical Impression Statement  Pt tolerated balance and strengthening exercises well today needing occasional rest breaks due to SOB.  Progressing well towards goals.    PT Treatment/Interventions  ADLs/Self Care Home Management;DME Instruction;Gait training;Stair training;Functional mobility training;Therapeutic activities;Therapeutic exercise;Balance training;Neuromuscular re-education;Patient/family education;Vestibular    PT Next Visit Plan  balance training on compliant surfaces, compliant balance beam-ankle, hip and stepping strategies    Consulted and Agree with Plan of Care  Patient       Patient will benefit from skilled therapeutic intervention in order to improve the following deficits and impairments:  Abnormal gait, Decreased activity tolerance, Decreased balance, Decreased endurance, Decreased knowledge of use of DME, Decreased mobility, Decreased strength, Dizziness, Postural dysfunction, Impaired flexibility  Visit Diagnosis: Other abnormalities of gait and mobility  Pain in right leg  Pain in left leg  Abnormal posture  Other symptoms and signs involving the musculoskeletal system  Muscle weakness (generalized)  Unsteadiness on feet  Dizziness and giddiness     Problem List There are no active problems to display for this patient.     Laureen Abrahams, PT, DPT 02/18/17 4:11 PM    [facility] [address] [place], [place], [address] Phone: [phone]   Fax:  [phone]  Name: Brian [name] MRN: [id number] Date of Birth: 12/10/1931

## 2017-02-25 ENCOUNTER — Ambulatory Visit: Payer: Medicare Other | Attending: Internal Medicine | Admitting: Physical Therapy

## 2017-02-25 ENCOUNTER — Encounter: Payer: Self-pay | Admitting: Physical Therapy

## 2017-02-25 DIAGNOSIS — R29898 Other symptoms and signs involving the musculoskeletal system: Secondary | ICD-10-CM | POA: Diagnosis present

## 2017-02-25 DIAGNOSIS — M6281 Muscle weakness (generalized): Secondary | ICD-10-CM | POA: Insufficient documentation

## 2017-02-25 DIAGNOSIS — R2689 Other abnormalities of gait and mobility: Secondary | ICD-10-CM | POA: Diagnosis present

## 2017-02-25 DIAGNOSIS — M79604 Pain in right leg: Secondary | ICD-10-CM | POA: Diagnosis present

## 2017-02-25 DIAGNOSIS — M79605 Pain in left leg: Secondary | ICD-10-CM | POA: Diagnosis present

## 2017-02-25 DIAGNOSIS — R293 Abnormal posture: Secondary | ICD-10-CM | POA: Diagnosis present

## 2017-02-25 DIAGNOSIS — R42 Dizziness and giddiness: Secondary | ICD-10-CM | POA: Diagnosis present

## 2017-02-25 DIAGNOSIS — R2681 Unsteadiness on feet: Secondary | ICD-10-CM | POA: Diagnosis present

## 2017-02-25 NOTE — Patient Instructions (Addendum)
Axial Extension (Chin Tuck)    Have support surface such as head rest or high chair back behind head.  Pull chin in and lengthen back of neck. Hold __10__ seconds while counting out loud. Repeat __10__ times. Do __1-2+__ sessions per day.  http://gt2.exer.us/449   Copyright  VHI. All rights reserved.  Shoulder Flexion: Sitting (Single Arm)    Raise arm up slowly and inhale, lower arm slowly and exhale.  Repeat _10_ times per set. Repeat with other arm. Do _5_ sessions per day.   http://tub.exer.us/281   Copyright  VHI. All rights reserved.

## 2017-02-25 NOTE — Therapy (Signed)
Brian [name] [address] [place], [place], [address] Phone: [phone]   Fax:  [phone]  Physical Therapy Treatment  Patient Details  Name: Brian [name] MRN: [id number] Date of Birth: Jan 03, 1932 Referring Provider: Benjamine Sprague, MD   Encounter Date: 02/25/2017  PT End of Session - 02/25/17 1535    Visit Number  12    Number of Visits  17    Date for PT Re-Evaluation  03/13/17    Authorization Type  Medicare G-code & Progress note every 10th visit    PT Start Time  1532    PT Stop Time  1625    PT Time Calculation (min)  53 min    Equipment Utilized During Treatment  Gait belt    Activity Tolerance  Patient tolerated treatment well    Behavior During Therapy  [facility] for tasks assessed/performed       History reviewed. No pertinent past medical history.  History reviewed. No pertinent surgical history.  There were no vitals filed for this visit.  Subjective Assessment - 02/25/17 1536    Subjective  Patient saw Dr. Werner Lean and recommended considering UPWalker over regualar rollator walker. He is planning to purchase a Psychologist, educational    Pertinent History  restrictive lung disease, HTN, DM, OA, chronic LBP, left knee pain, right knee replacement 2010, peripheral neuropathy,     Limitations  Lifting;Standing;Walking;House hold activities    Patient Stated Goals  Improve balance, walk with dexterity,     Currently in Pain?  No/denies        [facility] Adult PT Treatment/Exercise - 02/25/17 1642      Ambulation/Gait   Ambulation/Gait  Yes    Ambulation/Gait Assistance  4: Min guard;4: Min assist    Ambulation/Gait Assistance Details  Pt ambulating down hall scanning, performing head turns/head nods/diagonals both ways/ stop to turn/fast to slow. Pt had demonstrated LOB with diagonals and slowed down gait to perform task. Pt did well with fast to slow speed with gait. min guard to min a at times.     Ambulation Distance (Feet)  600 Feet    Assistive device  Straight cane    Gait Pattern  Step-through pattern;Decreased stride length;Trunk flexed      Exercises   Exercises  Neck      Neck Exercises: Seated   Other Seated Exercise  Pt seated in chair performing closed chain cervical retraction with chin tucked and back of head against wall with towel roll in between. cues for initial instruction and to perform at home in recliner or in car at stop light against head rest.     Other Seated Exercise  Pt seated in chair performing alt. shoulder flexion while inhailing with flexion to open chest and improve posture. Pt tolerated this well after initial instruction. Pt given this task to perform at home.           Balance Exercises - 02/25/17 1649      Balance Exercises: Standing   Rockerboard  Anterior/posterior;Lateral;EO;10 reps    Other Standing Exercises  Pt in paralell bars standing on rockerboard in front of mirror for visual feedback. Pt performing ant/post/lateral tilts with cues for slow controlled movements and to correct forward lean.         PT Education - 02/25/17 1652    Education provided  Yes    Education Details  Added to HEP: Cervical retraction with chin tucks, shoulder flexion.  PT Short Term Goals - 02/09/17 2300      PT SHORT TERM GOAL #1   Title  Patient demonstrates understanding of initial HEP.     Baseline  MET 02/09/2017    Time  4    Period  Weeks    Status  Achieved      PT SHORT TERM GOAL #2   Title  Patient verbalizes understanding of fall prevention strategies.     Baseline  MET 02/09/2017    Time  4    Period  Weeks    Status  Achieved      PT SHORT TERM GOAL #3   Title  Patient ambulates 300' with LRAD with supervision.     Baseline  MET 02/09/2017 with cane    Time  4    Period  Weeks    Status  Achieved        PT Long Term Goals - 02/16/17 1828      PT LONG TERM GOAL #1   Title  Patient demonstrates & verbalizes  ongoing fitness plan / HEP.  (All LTGs Target Date: 03/13/17)    Time  8    Period  Weeks    Status  On-going    Target Date  03/13/17      PT LONG TERM GOAL #2   Title  Berg Balance >42/56 to indicate lower fall risk.     Time  8    Period  Weeks    Status  Revised    Target Date  03/13/17      PT LONG TERM GOAL #3   Title  Patient ambulates with LRAD for balance & pain >1000 feet in 6 minutes (6 min walk test) modified independent.     Time  8    Period  Weeks    Status  Revised    Target Date  03/13/17      PT LONG TERM GOAL #4   Title  Assess dizziness / vestibular & set LTG    Baseline  achieved; no specific vestibular goal needed. Pt demonstrates decreased use of vestibular and somatosensory systems for balance, increased use of visual systems    Status  Achieved      PT LONG TERM GOAL #5   Title  Patient negotiates ramps, curbs & 10 stairs (forwards to ascend, backwards to descend with R rail to mimic attic entry) with LRAD modified independent.     Time  8    Period  Weeks    Status  On-going    Target Date  03/13/17      PT LONG TERM GOAL #6   Title  Patient reports pain increases <2 increments on 0-10 scale.     Time  8    Period  Weeks    Status  On-going    Target Date  03/13/17      PT LONG TERM GOAL #7   Title  Activities of Balance score >55%    Time  8    Period  Weeks    Status  On-going    Target Date  03/13/17            Plan - 02/25/17 1653    Clinical Impression Statement  Pt tolerated treatment well with no limitations due to pain or fatigue. Todays session focused on balance and therapeutic exercise. Pt would benefit from continued PT sessions to progress towards goals.     PT Treatment/Interventions  ADLs/Self Care Home Management;DME  Instruction;Gait training;Stair training;Functional mobility training;Therapeutic activities;Therapeutic exercise;Balance training;Neuromuscular re-education;Patient/family education;Vestibular    PT Next  Visit Plan  balance training on compliant surfaces, compliant balance beam-ankle, hip and stepping strategies    Consulted and Agree with Plan of Care  Patient       Patient will benefit from skilled therapeutic intervention in order to improve the following deficits and impairments:  Abnormal gait, Decreased activity tolerance, Decreased balance, Decreased endurance, Decreased knowledge of use of DME, Decreased mobility, Decreased strength, Dizziness, Postural dysfunction, Impaired flexibility  Visit Diagnosis: Other abnormalities of gait and mobility  Pain in right leg  Pain in left leg  Abnormal posture  Other symptoms and signs involving the musculoskeletal system  Muscle weakness (generalized)  Unsteadiness on feet  Dizziness and giddiness     Problem List There are no active problems to display for this patient.  Loral Campi, SPTA 02/25/2017, 4:55 PM  Jamey Reas, PT, DPT 02/25/2017, 10:56 PM  [facility] [address] [place], [place], [address] Phone: [phone]   Fax:  [phone]  Name: Brian [name] MRN: [id number] Date of Birth: 06/16/1931

## 2017-02-26 ENCOUNTER — Encounter: Payer: Self-pay | Admitting: Physical Therapy

## 2017-02-26 ENCOUNTER — Ambulatory Visit: Payer: Medicare Other | Admitting: Physical Therapy

## 2017-02-26 DIAGNOSIS — M79604 Pain in right leg: Secondary | ICD-10-CM

## 2017-02-26 DIAGNOSIS — R2681 Unsteadiness on feet: Secondary | ICD-10-CM

## 2017-02-26 DIAGNOSIS — M79605 Pain in left leg: Secondary | ICD-10-CM

## 2017-02-26 DIAGNOSIS — R2689 Other abnormalities of gait and mobility: Secondary | ICD-10-CM

## 2017-02-26 DIAGNOSIS — M6281 Muscle weakness (generalized): Secondary | ICD-10-CM

## 2017-02-26 DIAGNOSIS — R29898 Other symptoms and signs involving the musculoskeletal system: Secondary | ICD-10-CM

## 2017-02-26 DIAGNOSIS — R293 Abnormal posture: Secondary | ICD-10-CM

## 2017-02-26 NOTE — Therapy (Signed)
[facility] [address] [place], [place], [address] Phone: [phone]   Fax:  [phone]  Physical Therapy Treatment  Patient Details  Name: Brian [name] MRN: [id number] Date of Birth: 07/13/1931 Referring Provider: Benjamine Sprague, MD   Encounter Date: 02/26/2017  PT End of Session - 02/26/17 1648    Visit Number  13    Number of Visits  17    Date for PT Re-Evaluation  03/13/17    Authorization Type  Medicare G-code & Progress note every 10th visit    PT Start Time  1538    PT Stop Time  1620    PT Time Calculation (min)  42 min    Activity Tolerance  Patient tolerated treatment well    Behavior During Therapy  [facility] for tasks assessed/performed       History reviewed. No pertinent past medical history.  History reviewed. No pertinent surgical history.  There were no vitals filed for this visit.  Subjective Assessment - 02/26/17 1540    Subjective  Pt reports some soreness with back to back therapy days.  Feeling sad today; found out his sister has cancer and recently helped his sister in law write out her will.    Pertinent History  restrictive lung disease, HTN, DM, OA, chronic LBP, left knee pain, right knee replacement 2010, peripheral neuropathy,     Limitations  Lifting;Standing;Walking;House hold activities    Patient Stated Goals  Improve balance, walk with dexterity,     Currently in Pain?  Yes    Pain Score  2     Pain Location  Leg    Pain Orientation  Right;Left;Posterior    Pain Descriptors / Indicators  Sore    Pain Type  Acute pain                      [facility] Adult PT Treatment/Exercise - 02/26/17 1554      Transfers   Sit to Stand  5: Supervision;With upper extremity assist    Sit to Stand Details (indicate cue type and reason)  sit <> stand training from mat with focus on safe sequencing with rollator brakes and safe hand placement      Ambulation/Gait    Ambulation/Gait  Yes    Ambulation/Gait Assistance  5: Supervision    Ambulation/Gait Assistance Details  gait assessment with Drive rollator.  As pt fatigued he demonstrated increased trunk flexion and required verbal cues to maintain safe distance to rollator.  Pt demonstrated decreased SOB and improved endurance overall with rollator    Ambulation Distance (Feet)  230 Feet    Assistive device  4-wheeled walker    Gait Pattern  Step-through pattern;Decreased trunk rotation;Trunk flexed    Ambulation Surface  Level;Indoor    Ramp  5: Supervision    Ramp Details (indicate cue type and reason)  with rollator x 2 reps with verbal cues for posture and sequence; pt independent with setting brakes when descending ramp    Curb  4: Min assist    Curb Details (indicate cue type and reason)  with rollator x 2 reps with verbal cues for sequence, safety with lifting and lower rollator and cues for brake use          Balance Exercises - 02/26/17 1625      Balance Exercises: Standing   Balance Beam  standing across compliant blue foam beam performing bar bumps (to focus on hip strategy) with use of  breathing strategy for increased postural control and decrease breath holding.  Pt required increased cues for controlled flexion to bring hips back to bar and to allow knee flexion to bring tibia forwards over BOS to counteract posterior weight shift (poor use of closed chain ankle DF)    Other Standing Exercises  attempted heel walking for increased ankle DF strengthening but pt demonstrated significant posterior weight shift and LOB; transitioned to partial tandem standing and focusing on controlled forwards and backwards weight shifting with use of closed chain ankle DF with and then without UE support with supervision-min A.        PT Education - 02/26/17 1648    Education provided  Yes    Education Details  training with rollator    Person(s) Educated  Patient    Methods  Explanation;Demonstration     Comprehension  Need further instruction       PT Short Term Goals - 02/09/17 2300      PT SHORT TERM GOAL #1   Title  Patient demonstrates understanding of initial HEP.     Baseline  MET 02/09/2017    Time  4    Period  Weeks    Status  Achieved      PT SHORT TERM GOAL #2   Title  Patient verbalizes understanding of fall prevention strategies.     Baseline  MET 02/09/2017    Time  4    Period  Weeks    Status  Achieved      PT SHORT TERM GOAL #3   Title  Patient ambulates 300' with LRAD with supervision.     Baseline  MET 02/09/2017 with cane    Time  4    Period  Weeks    Status  Achieved        PT Long Term Goals - 02/16/17 1828      PT LONG TERM GOAL #1   Title  Patient demonstrates & verbalizes ongoing fitness plan / HEP.  (All LTGs Target Date: 03/13/17)    Time  8    Period  Weeks    Status  On-going    Target Date  03/13/17      PT LONG TERM GOAL #2   Title  Berg Balance >42/56 to indicate lower fall risk.     Time  8    Period  Weeks    Status  Revised    Target Date  03/13/17      PT LONG TERM GOAL #3   Title  Patient ambulates with LRAD for balance & pain >1000 feet in 6 minutes (6 min walk test) modified independent.     Time  8    Period  Weeks    Status  Revised    Target Date  03/13/17      PT LONG TERM GOAL #4   Title  Assess dizziness / vestibular & set LTG    Baseline  achieved; no specific vestibular goal needed. Pt demonstrates decreased use of vestibular and somatosensory systems for balance, increased use of visual systems    Status  Achieved      PT LONG TERM GOAL #5   Title  Patient negotiates ramps, curbs & 10 stairs (forwards to ascend, backwards to descend with R rail to mimic attic entry) with LRAD modified independent.     Time  8    Period  Weeks    Status  On-going    Target Date  03/13/17  PT LONG TERM GOAL #6   Title  Patient reports pain increases <2 increments on 0-10 scale.     Time  8    Period  Weeks     Status  On-going    Target Date  03/13/17      PT LONG TERM GOAL #7   Title  Activities of Balance score >55%    Time  8    Period  Weeks    Status  On-going    Target Date  03/13/17            Plan - 02/26/17 1649    Clinical Impression Statement  Re-started gait training with rollator due to session yesterday where pt has decided to purchase rollator instead of UPWalker.  Performed gait on level surfaces, up/down ramp and curb all with supervision with verbal cues for safe sequencing; pt demonstrated improved overall endurance and activity tolerance with rollator - pt able to ambulate further distances with decreased SOB.  Continued weight shifting and balance reaction training on solid and compliant surfaces.  Pt hopes to purchase his rollator this weekend in order to begin practicing with it in therapy next week.    PT Treatment/Interventions  ADLs/Self Care Home Management;DME Instruction;Gait training;Stair training;Functional mobility training;Therapeutic activities;Therapeutic exercise;Balance training;Neuromuscular re-education;Patient/family education;Vestibular    PT Next Visit Plan  pt to purchase rollator; continue gait and safety training.  Continue breathing/postural control and balance exercises    Consulted and Agree with Plan of Care  Patient       Patient will benefit from skilled therapeutic intervention in order to improve the following deficits and impairments:  Abnormal gait, Decreased activity tolerance, Decreased balance, Decreased endurance, Decreased knowledge of use of DME, Decreased mobility, Decreased strength, Dizziness, Postural dysfunction, Impaired flexibility  Visit Diagnosis: Other abnormalities of gait and mobility  Pain in right leg  Pain in left leg  Abnormal posture  Other symptoms and signs involving the musculoskeletal system  Muscle weakness (generalized)  Unsteadiness on feet     Problem List There are no active problems to  display for this patient.   Rico Junker, PT, DPT 02/26/17    4:53 PM    [facility] [address] [place], [place], [address] Phone: [phone]   Fax:  [phone]  Name: Brian [name] MRN: [id number] Date of Birth: 11/07/1931

## 2017-02-27 ENCOUNTER — Ambulatory Visit: Payer: Medicare Other | Admitting: Physical Therapy

## 2017-03-03 ENCOUNTER — Ambulatory Visit: Payer: Medicare Other | Admitting: Physical Therapy

## 2017-03-05 ENCOUNTER — Ambulatory Visit: Payer: Medicare Other | Admitting: Physical Therapy

## 2017-03-05 ENCOUNTER — Encounter: Payer: Self-pay | Admitting: Physical Therapy

## 2017-03-05 DIAGNOSIS — R29898 Other symptoms and signs involving the musculoskeletal system: Secondary | ICD-10-CM

## 2017-03-05 DIAGNOSIS — M79605 Pain in left leg: Secondary | ICD-10-CM

## 2017-03-05 DIAGNOSIS — R2689 Other abnormalities of gait and mobility: Secondary | ICD-10-CM

## 2017-03-05 DIAGNOSIS — R293 Abnormal posture: Secondary | ICD-10-CM

## 2017-03-05 DIAGNOSIS — M6281 Muscle weakness (generalized): Secondary | ICD-10-CM

## 2017-03-05 DIAGNOSIS — R2681 Unsteadiness on feet: Secondary | ICD-10-CM

## 2017-03-05 DIAGNOSIS — M79604 Pain in right leg: Secondary | ICD-10-CM

## 2017-03-05 NOTE — Therapy (Signed)
[facility] [address] [place], [place], [address] Phone: [phone]   Fax:  [phone]  Physical Therapy Treatment  Patient Details  Name: Brian [name] MRN: [id number] Date of Birth: 1931-05-21 Referring Provider: Benjamine Sprague, MD   Encounter Date: 03/05/2017  PT End of Session - 03/05/17 1653    Visit Number  14    Number of Visits  17    Date for PT Re-Evaluation  03/13/17    Authorization Type  Medicare G-code & Progress note every 10th visit    PT Start Time  1450    PT Stop Time  1534    PT Time Calculation (min)  44 min    Activity Tolerance  Patient tolerated treatment well    Behavior During Therapy  [facility] for tasks assessed/performed       History reviewed. No pertinent past medical history.  History reviewed. No pertinent surgical history.  There were no vitals filed for this visit.  Subjective Assessment - 03/05/17 1453    Subjective  Soreness resolved quickly, no pain or issues today.  Lost his sister to cancer three days ago but doing well overall.    Pertinent History  restrictive lung disease, HTN, DM, OA, chronic LBP, left knee pain, right knee replacement 2010, peripheral neuropathy,     Limitations  Lifting;Standing;Walking;House hold activities    Patient Stated Goals  Improve balance, walk with dexterity,     Currently in Pain?  No/denies                      [facility] Adult PT Treatment/Exercise - 03/05/17 1501      Transfers   Sit to Stand  5: Supervision    Stand to Sit  5: Supervision    Stand to Sit Details  continues to require verbal cues during sit<>stand from mat with rollator with cues for safe hand placement, one hand on rollator, one hand on stable mat      Ambulation/Gait   Ambulation/Gait  Yes    Ambulation/Gait Assistance  5: Supervision    Ambulation/Gait Assistance Details  gait training with rollator through obstacle course, weaving L and R around  cones, stepping over low obstacle, tall obstacles, and across compliant surface x 4 reps with verbal cues for safe sequence of lifting rollator up over low obstacle without putting brakes on.  By 4th repetition pt able to negotiate stepping over obstacles more smoothly maintaining constant gait speed    Assistive device  Rollator      Neuro Re-ed    Neuro Re-ed Details   focused on trunk/postural control with anterior/posterior pelvic tilts seated in high back chair with towel roll along spine and lumbar roll at low back, pillow between knees to facilitate sustained hip IR while performing inhalation during anterior pelvic tilts and exhalation during posterior pelvic tilts to improve posture and respiration.  Required verbal, visual and tactile cues for correct sequence to initiate from pelvis instead of head and shoulders.             PT Education - 03/05/17 1652    Education provided  Yes    Education Details  rollator, breathing/postural control    Person(s) Educated  Patient    Methods  Explanation;Demonstration    Comprehension  Need further instruction       PT Short Term Goals - 02/09/17 2300      PT SHORT TERM GOAL #1   Title  Patient demonstrates understanding of initial HEP.     Baseline  MET 02/09/2017    Time  4    Period  Weeks    Status  Achieved      PT SHORT TERM GOAL #2   Title  Patient verbalizes understanding of fall prevention strategies.     Baseline  MET 02/09/2017    Time  4    Period  Weeks    Status  Achieved      PT SHORT TERM GOAL #3   Title  Patient ambulates 300' with LRAD with supervision.     Baseline  MET 02/09/2017 with cane    Time  4    Period  Weeks    Status  Achieved        PT Long Term Goals - 02/16/17 1828      PT LONG TERM GOAL #1   Title  Patient demonstrates & verbalizes ongoing fitness plan / HEP.  (All LTGs Target Date: 03/13/17)    Time  8    Period  Weeks    Status  On-going    Target Date  03/13/17      PT LONG  TERM GOAL #2   Title  Berg Balance >42/56 to indicate lower fall risk.     Time  8    Period  Weeks    Status  Revised    Target Date  03/13/17      PT LONG TERM GOAL #3   Title  Patient ambulates with LRAD for balance & pain >1000 feet in 6 minutes (6 min walk test) modified independent.     Time  8    Period  Weeks    Status  Revised    Target Date  03/13/17      PT LONG TERM GOAL #4   Title  Assess dizziness / vestibular & set LTG    Baseline  achieved; no specific vestibular goal needed. Pt demonstrates decreased use of vestibular and somatosensory systems for balance, increased use of visual systems    Status  Achieved      PT LONG TERM GOAL #5   Title  Patient negotiates ramps, curbs & 10 stairs (forwards to ascend, backwards to descend with R rail to mimic attic entry) with LRAD modified independent.     Time  8    Period  Weeks    Status  On-going    Target Date  03/13/17      PT LONG TERM GOAL #6   Title  Patient reports pain increases <2 increments on 0-10 scale.     Time  8    Period  Weeks    Status  On-going    Target Date  03/13/17      PT LONG TERM GOAL #7   Title  Activities of Balance score >55%    Time  8    Period  Weeks    Status  On-going    Target Date  03/13/17            Plan - 03/05/17 1653    Clinical Impression Statement  Treatment session continued gait and safety training with rollator during sit <> stand transfers and gait while negotiating obstacles.  Pt continued to demonstrate more flexed posture with rollator too far in front; when cued for more upright posture pt tends to extend from neck.  Initiated seated trunk ROM, postural control and breathing training with towel rolls to support spine for increased  stability.  Will continue to address and progress towards LTG.    Rehab Potential  Good    Clinical Impairments Affecting Rehab Potential  restrictive lung disease, HTN, DM, OA, chronic LBP, left knee pain, right knee replacement  2010, peripheral neuropathy,     PT Frequency  2x / week    PT Duration  8 weeks    PT Treatment/Interventions  ADLs/Self Care Home Management;DME Instruction;Gait training;Stair training;Functional mobility training;Therapeutic activities;Therapeutic exercise;Balance training;Neuromuscular re-education;Patient/family education;Vestibular    PT Next Visit Plan  pt to purchase rollator - continue gait training with rollator.  Begin checking LTG.  D/C in 2 visits??    Consulted and Agree with Plan of Care  Patient       Patient will benefit from skilled therapeutic intervention in order to improve the following deficits and impairments:  Abnormal gait, Decreased activity tolerance, Decreased balance, Decreased endurance, Decreased knowledge of use of DME, Decreased mobility, Decreased strength, Dizziness, Postural dysfunction, Impaired flexibility  Visit Diagnosis: Other abnormalities of gait and mobility  Pain in right leg  Pain in left leg  Abnormal posture  Other symptoms and signs involving the musculoskeletal system  Muscle weakness (generalized)  Unsteadiness on feet     Problem List There are no active problems to display for this patient.  Rico Junker, PT, DPT 03/05/17    4:58 PM    [facility] [address] [place], [place], [address] Phone: [phone]   Fax:  [phone]  Name: AKASH WINSKI MRN: [id number] Date of Birth: 1932-03-19

## 2017-03-09 ENCOUNTER — Encounter: Payer: Self-pay | Admitting: Physical Therapy

## 2017-03-09 ENCOUNTER — Ambulatory Visit: Payer: Medicare Other | Admitting: Physical Therapy

## 2017-03-09 DIAGNOSIS — R2681 Unsteadiness on feet: Secondary | ICD-10-CM

## 2017-03-09 DIAGNOSIS — R293 Abnormal posture: Secondary | ICD-10-CM

## 2017-03-09 DIAGNOSIS — R2689 Other abnormalities of gait and mobility: Secondary | ICD-10-CM

## 2017-03-09 DIAGNOSIS — R29898 Other symptoms and signs involving the musculoskeletal system: Secondary | ICD-10-CM

## 2017-03-09 DIAGNOSIS — M6281 Muscle weakness (generalized): Secondary | ICD-10-CM

## 2017-03-09 DIAGNOSIS — M79605 Pain in left leg: Secondary | ICD-10-CM

## 2017-03-09 DIAGNOSIS — M79604 Pain in right leg: Secondary | ICD-10-CM

## 2017-03-10 NOTE — Therapy (Signed)
[facility] [address] [place], [place], [address] Phone: [phone]   Fax:  [phone]  Physical Therapy Treatment  Patient Details  Name: Brian [name] MRN: [id number] Date of Birth: 24-Jan-1932 Referring Provider: Benjamine Sprague, MD   Encounter Date: 03/09/2017  PT End of Session - 03/09/17 1816    Visit Number  15    Number of Visits  17    Date for PT Re-Evaluation  03/13/17    Authorization Type  Medicare G-code & Progress note every 10th visit    PT Start Time  1445    PT Stop Time  1530    PT Time Calculation (min)  45 min    Activity Tolerance  Patient tolerated treatment well    Behavior During Therapy  [facility] for tasks assessed/performed       History reviewed. No pertinent past medical history.  History reviewed. No pertinent surgical history.  There were no vitals filed for this visit.  Subjective Assessment - 03/09/17 1447    Subjective  He purchased the rollator today.     Pertinent History  restrictive lung disease, HTN, DM, OA, chronic LBP, left knee pain, right knee replacement 2010, peripheral neuropathy,     Limitations  Lifting;Standing;Walking;House hold activities    Patient Stated Goals  Improve balance, walk with dexterity,     Currently in Pain?  Yes    Pain Score  7     Pain Location  Leg    Pain Orientation  Right;Left;Posterior    Pain Descriptors / Indicators  Sore    Pain Type  Chronic pain    Pain Onset  More than a month ago    Pain Frequency  Intermittent    Aggravating Factors   he spent >1 hr trying to get a bird out of his house.     Pain Relieving Factors  rest    Effect of Pain on Daily Activities  limits standing         [facility] PT Assessment - 03/09/17 1445      Assessment   Medical Diagnosis  gait disorder      6 Minute Walk- Baseline   6 Minute Walk- Baseline  yes    BP (mmHg)  117/75    HR (bpm)  91    02 Sat (%RA)  95 %    Modified Borg Scale for  Dyspnea  0- Nothing at all      6 Minute walk- Post Test   6 Minute Walk Post Test  yes    BP (mmHg)  142/70    HR (bpm)  105    02 Sat (%RA)  94 %    Modified Borg Scale for Dyspnea  5- Strong or hard breathing    Perceived Rate of Exertion (Borg)  13- Somewhat hard      6 minute walk test results    Aerobic Endurance Distance Walked  1128    Endurance additional comments  rollator walker, no back pain      Berg Balance Test   Sit to Stand  Able to stand without using hands and stabilize independently    Standing Unsupported  Able to stand safely 2 minutes    Sitting with Back Unsupported but Feet Supported on Floor or Stool  Able to sit safely and securely 2 minutes    Stand to Sit  Sits safely with minimal use of hands    Transfers  Able to transfer safely,  minor use of hands    Standing Unsupported with Eyes Closed  Able to stand 10 seconds safely    Standing Ubsupported with Feet Together  Able to place feet together independently and stand for 1 minute with supervision    From Standing, Reach Forward with Outstretched Arm  Can reach forward >12 cm safely (5")    From Standing Position, Pick up Object from [place] to pick up shoe, needs supervision    From Standing Position, Turn to Look Behind Over each Shoulder  Looks behind one side only/other side shows less weight shift    Turn 360 Degrees  Able to turn 360 degrees safely but slowly    Standing Unsupported, Alternately Place Feet on Step/Stool  Able to complete >2 steps/needs minimal assist    Standing Unsupported, One Foot in Front  Needs help to step but can hold 15 seconds    Standing on One Leg  Tries to lift leg/unable to hold 3 seconds but remains standing independently    Total Score  41    Berg comment:  Initial was 24/56                  [facility] Adult PT Treatment/Exercise - 03/09/17 1445      Ambulation/Gait   Ambulation/Gait  Yes    Ramp  6: Modified independent (Device) rollator walker    Curb   6: Modified independent (Device/increase time) rollator walker               PT Short Term Goals - 02/09/17 2300      PT SHORT TERM GOAL #1   Title  Patient demonstrates understanding of initial HEP.     Baseline  MET 02/09/2017    Time  4    Period  Weeks    Status  Achieved      PT SHORT TERM GOAL #2   Title  Patient verbalizes understanding of fall prevention strategies.     Baseline  MET 02/09/2017    Time  4    Period  Weeks    Status  Achieved      PT SHORT TERM GOAL #3   Title  Patient ambulates 300' with LRAD with supervision.     Baseline  MET 02/09/2017 with cane    Time  4    Period  Weeks    Status  Achieved        PT Long Term Goals - 03/09/17 1818      PT LONG TERM GOAL #1   Title  Patient demonstrates & verbalizes ongoing fitness plan / HEP.  (All LTGs Target Date: 03/13/17)    Time  8    Period  Weeks    Status  On-going    Target Date  03/13/17      PT LONG TERM GOAL #2   Title  Berg Balance >42/56 to indicate lower fall risk.     Baseline  MET 03/09/2017  Merrilee Jansky 41/56    Time  8    Period  Weeks    Status  Achieved      PT LONG TERM GOAL #3   Title  Patient ambulates with LRAD for balance & pain >1000 feet in 6 minutes (6 min walk test) modified independent.     Baseline  MET 03/09/17 1126' in 6 Min Walk Test with rollator walker with no back pain & no change in muscle pain noted at intake.     Time  8    Period  Weeks    Status  Achieved      PT LONG TERM GOAL #4   Title  Assess dizziness / vestibular & set LTG    Baseline  achieved; no specific vestibular goal needed. Pt demonstrates decreased use of vestibular and somatosensory systems for balance, increased use of visual systems    Status  Achieved      PT LONG TERM GOAL #5   Title  Patient negotiates ramps, curbs & 10 stairs (forwards to ascend, backwards to descend with R rail to mimic attic entry) with LRAD modified independent.     Baseline  On 03/09/2017, pt modified  independent on ramp & curb with rollator walker.     Time  8    Period  Weeks    Status  On-going    Target Date  03/13/17      PT LONG TERM GOAL #6   Title  Patient reports pain increases <2 increments on 0-10 scale.     Time  8    Period  Weeks    Status  On-going    Target Date  03/13/17      PT LONG TERM GOAL #7   Title  Activities of Balance score >55%    Time  8    Period  Weeks    Status  On-going    Target Date  03/13/17            Plan - 03/09/17 1821    Clinical Impression Statement  Patient met 2 of LTGs of Berg Balance 41/56 & 6-Min Walk Test 1128' with rollator walker. Patient appears will meet remaining LTGs at next visit with anticipated discharge.     Rehab Potential  Good    Clinical Impairments Affecting Rehab Potential  restrictive lung disease, HTN, DM, OA, chronic LBP, left knee pain, right knee replacement 2010, peripheral neuropathy,     PT Frequency  2x / week    PT Duration  8 weeks    PT Treatment/Interventions  ADLs/Self Care Home Management;DME Instruction;Gait training;Stair training;Functional mobility training;Therapeutic activities;Therapeutic exercise;Balance training;Neuromuscular re-education;Patient/family education;Vestibular    PT Next Visit Plan  check remaining LTGs    Consulted and Agree with Plan of Care  Patient       Patient will benefit from skilled therapeutic intervention in order to improve the following deficits and impairments:  Abnormal gait, Decreased activity tolerance, Decreased balance, Decreased endurance, Decreased knowledge of use of DME, Decreased mobility, Decreased strength, Dizziness, Postural dysfunction, Impaired flexibility  Visit Diagnosis: Other abnormalities of gait and mobility  Pain in right leg  Pain in left leg  Abnormal posture  Other symptoms and signs involving the musculoskeletal system  Muscle weakness (generalized)  Unsteadiness on feet     Problem List There are no active  problems to display for this patient.   Jamey Reas PT, DPT 03/10/2017, 8:23 AM  [facility] [address] [place], [place], [address] Phone: [phone]   Fax:  [phone]  Name: Brian [name] MRN: [id number] Date of Birth: 1931-04-21

## 2017-03-11 ENCOUNTER — Encounter: Payer: Self-pay | Admitting: Physical Therapy

## 2017-03-11 ENCOUNTER — Ambulatory Visit: Payer: Medicare Other | Admitting: Physical Therapy

## 2017-03-11 DIAGNOSIS — R2689 Other abnormalities of gait and mobility: Secondary | ICD-10-CM | POA: Diagnosis not present

## 2017-03-11 DIAGNOSIS — M6281 Muscle weakness (generalized): Secondary | ICD-10-CM

## 2017-03-11 DIAGNOSIS — R293 Abnormal posture: Secondary | ICD-10-CM

## 2017-03-11 DIAGNOSIS — R2681 Unsteadiness on feet: Secondary | ICD-10-CM

## 2017-03-12 NOTE — Therapy (Signed)
[facility] [address] [place], [place], [address] Phone: [phone]   Fax:  [phone]  Physical Therapy Treatment  Patient Details  Name: Brian [name] MRN: [id number] Date of Birth: 06-Sep-1931 Referring Provider: Benjamine Sprague, MD   Encounter Date: 03/11/2017  PT End of Session - 03/11/17 2032    Visit Number  16    Number of Visits  17    Date for PT Re-Evaluation  03/13/17    Authorization Type  Medicare G-code & Progress note every 10th visit    PT Start Time  1530    PT Stop Time  1615    PT Time Calculation (min)  45 min    Activity Tolerance  Patient tolerated treatment well    Behavior During Therapy  [facility] for tasks assessed/performed       History reviewed. No pertinent past medical history.  History reviewed. No pertinent surgical history.  There were no vitals filed for this visit.  Subjective Assessment - 03/11/17 1538    Subjective  He feels like PT has helped his safety & mobility.     Pertinent History  restrictive lung disease, HTN, DM, OA, chronic LBP, left knee pain, right knee replacement 2010, peripheral neuropathy,     Limitations  Lifting;Standing;Walking;House hold activities    Patient Stated Goals  Improve balance, walk with dexterity,     Currently in Pain?  No/denies    Pain Onset  More than a month ago         [facility] PT Assessment - 03/11/17 1530      Observation/Other Assessments   Focus on Therapeutic Outcomes (FOTO)   67.60 Initial was 45.63 FS    Activities of Balance Confidence Scale (ABC Scale)   56.9% Initial ABC 48.1%                  [facility] Adult PT Treatment/Exercise - 03/11/17 1530      Ambulation/Gait   Ambulation/Gait  Yes    Ambulation/Gait Assistance  6: Modified independent (Device/Increase time)    Ambulation/Gait Assistance Details  household distances without device, basic community handicap space in/out home & businesses; full community  including grass with rollator walker.     Ambulation Distance (Feet)  250 Feet    Assistive device  Straight cane    Ramp  6: Modified independent (Device) cane with quad tip    Curb  6: Modified independent (Device/increase time) cane with quad tip             PT Education - 03/11/17 1600    Education provided  Yes    Education Details  reviewed HEP using [employer], [employer] with fitness room & classes SAIL & chair YOGA    Person(s) Educated  Patient    Methods  Explanation;Verbal cues;Other (comment) internet info on [place]  Verbalized understanding       PT Short Term Goals - 02/09/17 2300      PT SHORT TERM GOAL #1   Title  Patient demonstrates understanding of initial HEP.     Baseline  MET 02/09/2017    Time  4    Period  Weeks    Status  Achieved      PT SHORT TERM GOAL #2   Title  Patient verbalizes understanding of fall prevention strategies.     Baseline  MET 02/09/2017    Time  4    Period  Weeks  Status  Achieved      PT SHORT TERM GOAL #3   Title  Patient ambulates 300' with LRAD with supervision.     Baseline  MET 02/09/2017 with cane    Time  4    Period  Weeks    Status  Achieved        PT Long Term Goals - 03/11/17 2033      PT LONG TERM GOAL #1   Title  Patient demonstrates & verbalizes ongoing fitness plan / HEP.  (All LTGs Target Date: 03/13/17)    Baseline  MET 03/11/2017    Time  8    Period  Weeks    Status  Achieved      PT LONG TERM GOAL #2   Title  Berg Balance >41/56 to indicate lower fall risk.     Baseline  MET 03/09/2017  Merrilee Jansky 41/56    Time  8    Period  Weeks    Status  Achieved      PT LONG TERM GOAL #3   Title  Patient ambulates with LRAD for balance & pain >1000 feet in 6 minutes (6 min walk test) modified independent.     Baseline  MET 03/09/17 1126' in 6 Min Walk Test with rollator walker with no back pain & no change in muscle pain noted at intake.     Time  8    Period   Weeks    Status  Achieved      PT LONG TERM GOAL #4   Title  Assess dizziness / vestibular & set LTG    Baseline  achieved; no specific vestibular goal needed. Pt demonstrates decreased use of vestibular and somatosensory systems for balance, increased use of visual systems    Status  Achieved      PT LONG TERM GOAL #5   Title  Patient negotiates ramps, curbs & 10 stairs (forwards to ascend, backwards to descend with R rail to mimic attic entry) with LRAD modified independent.     Baseline  MET 03/11/2017    Time  8    Period  Weeks    Status  Achieved      PT LONG TERM GOAL #6   Title  Patient reports pain increases <2 increments on 0-10 scale.     Baseline  MET 03/11/2017    Time  8    Period  Weeks    Status  Achieved      PT LONG TERM GOAL #7   Title  Activities of Balance score >55%    Baseline  MET 03/11/2017  ABC 56.9%    Time  8    Period  Weeks    Status  Achieved            Plan - 03/11/17 2036    Clinical Impression Statement  Patient met all 7 LTGs. He improved Berg Balance to 41/56 which indicates improved fall risk but score <45/56 indicates fall risk. Patient ambulates household mobilty without device safely; basic community including ramps & curbs with cane with quad tip safely and full community with rollator walker that he purchased.     Rehab Potential  Good    Clinical Impairments Affecting Rehab Potential  restrictive lung disease, HTN, DM, OA, chronic LBP, left knee pain, right knee replacement 2010, peripheral neuropathy,     PT Frequency  2x / week    PT Duration  8 weeks    PT Treatment/Interventions  ADLs/Self Care  Home Management;DME Instruction;Gait training;Stair training;Functional mobility training;Therapeutic activities;Therapeutic exercise;Balance training;Neuromuscular re-education;Patient/family education;Vestibular    PT Next Visit Plan  discharge    Consulted and Agree with Plan of Care  Patient       Patient will benefit from  skilled therapeutic intervention in order to improve the following deficits and impairments:  Abnormal gait, Decreased activity tolerance, Decreased balance, Decreased endurance, Decreased knowledge of use of DME, Decreased mobility, Decreased strength, Dizziness, Postural dysfunction, Impaired flexibility  Visit Diagnosis: Other abnormalities of gait and mobility  Abnormal posture  Muscle weakness (generalized)  Unsteadiness on feet   G-Codes - March 19, 2017 [birth year]    Functional Assessment Tool Used (Outpatient Only)  Berg Balance 41/56    Functional Limitation  Mobility: Walking and moving around    Mobility: Walking and Moving Around Goal Status [phone])  At least 20 percent but less than 40 percent impaired, limited or restricted    Mobility: Walking and Moving Around Discharge Status [phone])  At least 20 percent but less than 40 percent impaired, limited or restricted       Problem List There are no active problems to display for this patient.  PHYSICAL THERAPY DISCHARGE SUMMARY  Visits from Start of Care: 16  Current functional level related to goals / functional outcomes: See above   Remaining deficits: See above   Education / Equipment: [place]: Patient agrees to discharge.  Patient goals were met. Patient is being discharged due to meeting the stated rehab goals.  ?????          Daymion Nazaire PT, DPT 03/12/2017, 8:43 PM  [facility] [address] [place], [place], [address] Phone: [phone]   Fax:  [phone]  Name: Brian [name] MRN: [id number] Date of Birth: February 08, 1932

## 2018-01-24 ENCOUNTER — Emergency Department (HOSPITAL_COMMUNITY): Payer: Medicare Other

## 2018-01-24 ENCOUNTER — Emergency Department (HOSPITAL_COMMUNITY)
Admission: EM | Admit: 2018-01-24 | Discharge: 2018-01-24 | Disposition: A | Discharge status: Home / Self Care | Payer: Medicare Other | Attending: Emergency Medicine | Admitting: Emergency Medicine

## 2018-01-24 ENCOUNTER — Encounter (HOSPITAL_COMMUNITY): Payer: Self-pay

## 2018-01-24 ENCOUNTER — Other Ambulatory Visit: Payer: Self-pay

## 2018-01-24 DIAGNOSIS — Z7982 Long term (current) use of aspirin: Secondary | ICD-10-CM | POA: Insufficient documentation

## 2018-01-24 DIAGNOSIS — Z79899 Other long term (current) drug therapy: Secondary | ICD-10-CM | POA: Diagnosis not present

## 2018-01-24 DIAGNOSIS — Y998 Other external cause status: Secondary | ICD-10-CM | POA: Diagnosis not present

## 2018-01-24 DIAGNOSIS — E785 Hyperlipidemia, unspecified: Secondary | ICD-10-CM | POA: Diagnosis not present

## 2018-01-24 DIAGNOSIS — I10 Essential (primary) hypertension: Secondary | ICD-10-CM | POA: Insufficient documentation

## 2018-01-24 DIAGNOSIS — X500XXA Overexertion from strenuous movement or load, initial encounter: Secondary | ICD-10-CM | POA: Diagnosis not present

## 2018-01-24 DIAGNOSIS — Y9289 Other specified places as the place of occurrence of the external cause: Secondary | ICD-10-CM | POA: Insufficient documentation

## 2018-01-24 DIAGNOSIS — S99922A Unspecified injury of left foot, initial encounter: Secondary | ICD-10-CM | POA: Diagnosis present

## 2018-01-24 DIAGNOSIS — S82832A Other fracture of upper and lower end of left fibula, initial encounter for closed fracture: Secondary | ICD-10-CM | POA: Insufficient documentation

## 2018-01-24 DIAGNOSIS — Y9389 Activity, other specified: Secondary | ICD-10-CM | POA: Insufficient documentation

## 2018-01-24 HISTORY — DX: Major depressive disorder, single episode, unspecified: F32.9

## 2018-01-24 HISTORY — DX: Depression, unspecified: F32.A

## 2018-01-24 HISTORY — DX: Essential (primary) hypertension: I10

## 2018-01-24 HISTORY — DX: Hyperlipidemia, unspecified: E78.5

## 2018-01-24 HISTORY — DX: Type 2 diabetes mellitus without complications: E11.9

## 2018-01-24 NOTE — ED Notes (Signed)
Pt reports he took 2 tylenol at home PTA.

## 2018-01-24 NOTE — ED Triage Notes (Signed)
Pt BIB his daughter in law from urgent care who sent pt to hospital for fractured fibula.  Pt does not have copy of x-ray with him.  Pt reports he was changing a lawn light bulb and lost his balance when he stood up, his L ankles twisted inward and he fell to the ground.  Pt was unable to stand following fall due to pain.  Pt reports pain to lateral aspect of ankle and lower part of LLE.  Swelling to L ankle noted.  Pt AOx4, respirations even and unlabored.  Sensation, motor in tact at baseline, pt reports mild neuropathy in bilateral feet.

## 2018-01-24 NOTE — ED Provider Notes (Signed)
[facility] DEPT Provider Note   CSN: [id number] Arrival date & time: 01/24/18  1630     History   Chief Complaint Chief Complaint  Patient presents with  . Foot Pain    L foot pain following loss of balance and fall    HPI Brian [name] is a 84 y.o. male.  84 year old male presents with left ankle injury.  Patient states that he was kneeling down changing a light bulb outside yesterday and when he went to stand up he rolled his ankle and stepped on it.  Patient is able to bear weight today with pain, has noticed swelling in his ankle with pain laterally.  Patient went to urgent care today where he had x-rays showing a distal fibula fracture with concern for medial malleolus fracture patient was sent to the ER for further evaluation.  Patient is not on blood thinners, has a history of diabetes.  Denies feeling weak or dizzy prior to the fall, no other injuries or concerns.  Patient is ambulatory with a cane and walker at times at baseline.     Past Medical History:  Diagnosis Date  . Depression   . Diabetes mellitus without complication ([place])   . Hyperlipemia   . Hypertension     There are no active problems to display for this patient.   Past Surgical History:  Procedure Laterality Date  . MEDIAL PARTIAL KNEE REPLACEMENT          Home Medications    Prior to Admission medications   Medication Sig Start Date End Date Taking? Authorizing Provider  aspirin 81 MG tablet Take 81 mg by mouth daily.    [provider]  cephALEXin (KEFLEX) 500 MG capsule Take 1 capsule (500 mg total) by mouth 3 (three) times daily. Patient not taking: Reported on 01/14/2017 11/23/15   Trula Slade, DPM  citalopram (CELEXA) 10 MG tablet Take 10 mg by mouth daily.    [provider]  citalopram (CELEXA) 20 MG tablet  11/27/15   [provider]  docusate sodium (COLACE) 100 MG capsule Take 100 mg by mouth. 10/11/15   [provider]  doxycycline (VIBRA-TABS) 100 MG tablet Take 100 mg by mouth 2 (two) times daily.    [provider]  fenofibrate (TRICOR) 145 MG tablet Take 145 mg by mouth daily.    [provider]  finasteride (PROSCAR) 5 MG tablet Take 5 mg by mouth daily.    [provider]  furosemide (LASIX) 40 MG tablet Take 40 mg by mouth.    [provider]  HYDROcodone-acetaminophen (NORCO/VICODIN) 5-325 MG tablet Take by mouth. 10/11/15   [provider]  lisinopril (PRINIVIL,ZESTRIL) 10 MG tablet Take 10 mg by mouth daily.    [provider]  meloxicam (MOBIC) 15 MG tablet Take 15 mg by mouth. 10/08/15   [provider]  metFORMIN (GLUCOPHAGE) 500 MG tablet Take by mouth 2 (two) times daily with a meal.    [provider]  potassium chloride (MICRO-K) 10 MEQ CR capsule Take 10 mEq by mouth 2 (two) times daily.    [provider]    Family History No family history on file.  Social History Social History   Tobacco Use  . Smoking status: Never Smoker  . Smokeless tobacco: Never Used  Substance Use Topics  . Alcohol use: Not on file  . Drug use: Not on file     Allergies   Beta adrenergic  blockers   Review of Systems Review of Systems  Constitutional: Negative for fever.  Musculoskeletal: Positive for arthralgias, gait problem, joint swelling and myalgias.  Skin: Negative for color change, rash and wound.  Allergic/Immunologic: Positive for immunocompromised state.  Neurological: Negative for weakness and numbness.  Hematological: Does not bruise/bleed easily.  Psychiatric/Behavioral: Negative for self-injury.  All other systems reviewed and are negative.    Physical Exam Updated Vital Signs BP 135/78 (BP Location: Right Arm)   Pulse 92   Temp 99.5 F (37.5 C) (Oral)   Resp 20   SpO2 95%   Physical Exam  Constitutional: He is oriented to person, place, and time. He appears well-developed and  well-nourished. No distress.  HENT:  Head: Normocephalic and atraumatic.  Cardiovascular: Intact distal pulses.  Pulmonary/Chest: Effort normal.  Musculoskeletal: He exhibits tenderness. He exhibits no deformity.       Left knee: Normal.       Left ankle: Tenderness. Lateral malleolus tenderness found. No medial malleolus, no head of 5th metatarsal and no proximal fibula tenderness found.       Feet:  Neurological: He is alert and oriented to person, place, and time. No sensory deficit.  Skin: Skin is warm and dry. No rash noted. He is not diaphoretic. No erythema.  Psychiatric: He has a normal mood and affect. His behavior is normal.  Nursing note and vitals reviewed.    ED Treatments / Results  Labs (all labs ordered are listed, but only abnormal results are displayed) Labs Reviewed - No data to display  EKG None  Radiology Dg Tibia/fibula Left  Result Date: 01/24/2018 CLINICAL DATA:  Recent fall from ladder with ankle pain EXAM: LEFT TIBIA AND FIBULA - 2 VIEW COMPARISON:  None. FINDINGS: Distal fibular fracture is again identified similar to that seen on films obtained earlier in the same day. Associated soft tissue swelling is noted. The proximal tibia and fibula are within normal limits. The chronic appearing bony density adjacent to the medial malleolus is again identified and stable. Degenerative changes of the knee joint are seen particularly in the medial joint space. IMPRESSION: Distal fibular fracture similar to that seen on prior ankle films. Electronically Signed   By: Inez Catalina M.D.   On: 01/24/2018 18:04    Procedures Procedures (including critical care time)  Medications Ordered in ED Medications - No data to display   Initial Impression / Assessment and Plan / ED Course  I have reviewed the triage vital signs and the nursing notes.  Pertinent labs & imaging results that were available during my care of the patient were reviewed by me and considered in my  medical decision making (see chart for details).  Clinical Course as of Jan 24 1899  Sun Jan 24, 4577  7458 84 year old male presents with left ankle injury.  X-ray of the tib-fib today shows distal fibula fracture.  Patient was sent from urgent care for possible medial ankle injury, patient does not have any tenderness to the medial ankle and the x-ray shows chronic changes, no acute injury.  Patient was placed in a cam walker boot, advised to ambulate with his cane or walker and referred to orthopedics for follow-up.  Patient was seen by Dr. Francia Greaves, ER attending, agrees with plan of care.   [LM]    Clinical Course User Index [LM] Tacy Learn, PA-C   Final Clinical Impressions(s) / ED Diagnoses   Final diagnoses:  Closed fracture of distal end of left fibula, unspecified  fracture morphology, initial encounter    ED Discharge Orders    None       Roque Lias 01/24/18 1900    Valarie Merino, MD 01/25/18 [phone]

## 2018-01-24 NOTE — Discharge Instructions (Addendum)
Use walker or cane while walking. At home- elevate the leg above the level of your heart, apply ice to area for 20 minutes at a time, at least 3 times daily. *place a towel between your skin and the ice pack*  Follow up with orthopedics, referral given. Return to ER for worsening or concerning symptoms.

## 2018-01-27 DIAGNOSIS — M25572 Pain in left ankle and joints of left foot: Secondary | ICD-10-CM | POA: Insufficient documentation

## 2018-02-24 LAB — BASIC METABOLIC PANEL
BUN: 21 (ref 4–21)
CO2: 30 — AB (ref 13–22)
Chloride: 104 (ref 99–108)
Creatinine: 1 (ref 0.6–1.3)
Glucose: 86
Potassium: 4.9 (ref 3.4–5.3)
Sodium: 140 (ref 137–147)

## 2018-02-24 LAB — COMPREHENSIVE METABOLIC PANEL: Calcium: 9.9 (ref 8.7–10.7)

## 2018-02-24 LAB — HEMOGLOBIN A1C: Hemoglobin A1C: 6.8

## 2018-09-27 LAB — HEPATIC FUNCTION PANEL
ALT: 16 (ref 10–40)
AST: 17 (ref 14–40)
Bilirubin, Total: 0.4

## 2018-09-27 LAB — CBC: RBC: 4.53 (ref 3.87–5.11)

## 2018-09-27 LAB — CBC AND DIFFERENTIAL
HCT: 44 (ref 41–53)
Hemoglobin: 14.5 (ref 13.5–17.5)
Platelets: 212 (ref 150–399)
WBC: 7.4

## 2018-09-27 LAB — BASIC METABOLIC PANEL
BUN: 21 (ref 4–21)
CO2: 31 — AB (ref 13–22)
Chloride: 100 (ref 99–108)
Creatinine: 1 (ref 0.6–1.3)
Glucose: 77
Potassium: 5.1 (ref 3.4–5.3)
Sodium: 136 — AB (ref 137–147)

## 2018-09-27 LAB — COMPREHENSIVE METABOLIC PANEL
Albumin: 4.3 (ref 3.5–5.0)
Calcium: 10.1 (ref 8.7–10.7)

## 2018-09-27 LAB — LIPID PANEL
HDL: 60 (ref 35–70)
LDL Cholesterol: 126
Triglycerides: 324 — AB (ref 40–160)

## 2018-09-27 LAB — HEMOGLOBIN A1C: Hemoglobin A1C: 6.7

## 2018-11-23 DEATH — deceased

## 2019-02-17 ENCOUNTER — Other Ambulatory Visit: Payer: Self-pay

## 2019-02-17 ENCOUNTER — Emergency Department (HOSPITAL_COMMUNITY)
Admission: EM | Admit: 2019-02-17 | Discharge: 2019-02-17 | Disposition: A | Discharge status: Home / Self Care | Payer: Medicare Other | Attending: Emergency Medicine | Admitting: Emergency Medicine

## 2019-02-17 ENCOUNTER — Emergency Department (HOSPITAL_BASED_OUTPATIENT_CLINIC_OR_DEPARTMENT_OTHER)
Admit: 2019-02-17 | Discharge: 2019-02-17 | Disposition: A | Discharge status: Home / Self Care | Payer: Medicare Other | Attending: Emergency Medicine | Admitting: Emergency Medicine

## 2019-02-17 ENCOUNTER — Emergency Department (HOSPITAL_COMMUNITY): Payer: Medicare Other

## 2019-02-17 ENCOUNTER — Encounter (HOSPITAL_COMMUNITY): Payer: Self-pay | Admitting: Emergency Medicine

## 2019-02-17 DIAGNOSIS — M542 Cervicalgia: Secondary | ICD-10-CM | POA: Insufficient documentation

## 2019-02-17 DIAGNOSIS — W01198A Fall on same level from slipping, tripping and stumbling with subsequent striking against other object, initial encounter: Secondary | ICD-10-CM | POA: Diagnosis not present

## 2019-02-17 DIAGNOSIS — Y939 Activity, unspecified: Secondary | ICD-10-CM | POA: Diagnosis not present

## 2019-02-17 DIAGNOSIS — S0121XA Laceration without foreign body of nose, initial encounter: Secondary | ICD-10-CM | POA: Diagnosis not present

## 2019-02-17 DIAGNOSIS — R52 Pain, unspecified: Secondary | ICD-10-CM

## 2019-02-17 DIAGNOSIS — Y929 Unspecified place or not applicable: Secondary | ICD-10-CM | POA: Diagnosis not present

## 2019-02-17 DIAGNOSIS — Z23 Encounter for immunization: Secondary | ICD-10-CM | POA: Diagnosis not present

## 2019-02-17 DIAGNOSIS — S0990XA Unspecified injury of head, initial encounter: Secondary | ICD-10-CM | POA: Diagnosis not present

## 2019-02-17 DIAGNOSIS — M545 Low back pain: Secondary | ICD-10-CM | POA: Insufficient documentation

## 2019-02-17 DIAGNOSIS — S0993XA Unspecified injury of face, initial encounter: Secondary | ICD-10-CM

## 2019-02-17 DIAGNOSIS — Y999 Unspecified external cause status: Secondary | ICD-10-CM | POA: Insufficient documentation

## 2019-02-17 DIAGNOSIS — E119 Type 2 diabetes mellitus without complications: Secondary | ICD-10-CM | POA: Insufficient documentation

## 2019-02-17 DIAGNOSIS — I10 Essential (primary) hypertension: Secondary | ICD-10-CM | POA: Diagnosis not present

## 2019-02-17 DIAGNOSIS — W010XXA Fall on same level from slipping, tripping and stumbling without subsequent striking against object, initial encounter: Secondary | ICD-10-CM

## 2019-02-17 DIAGNOSIS — S022XXB Fracture of nasal bones, initial encounter for open fracture: Secondary | ICD-10-CM

## 2019-02-17 DIAGNOSIS — S0181XA Laceration without foreign body of other part of head, initial encounter: Secondary | ICD-10-CM

## 2019-02-17 DIAGNOSIS — M7989 Other specified soft tissue disorders: Secondary | ICD-10-CM | POA: Diagnosis not present

## 2019-02-17 MED ORDER — MORPHINE SULFATE (PF) 4 MG/ML IV SOLN
4.0000 mg | Freq: Once | INTRAVENOUS | Status: AC
  Administered 2019-02-17: 4 mg via INTRAVENOUS
  Filled 2019-02-17: qty 1

## 2019-02-17 MED ORDER — LIDOCAINE HCL (PF) 1 % IJ SOLN
5.0000 mL | Freq: Once | INTRAMUSCULAR | Status: AC
  Administered 2019-02-17: 5 mL
  Filled 2019-02-17: qty 30

## 2019-02-17 MED ORDER — ONDANSETRON HCL 4 MG/2ML IJ SOLN
4.0000 mg | Freq: Once | INTRAMUSCULAR | Status: AC
  Administered 2019-02-17: 4 mg via INTRAVENOUS
  Filled 2019-02-17: qty 2

## 2019-02-17 MED ORDER — CEPHALEXIN 500 MG PO CAPS: 500.0000 mg | ORAL_CAPSULE | Freq: Three times a day (TID) | ORAL | 0 refills | Status: DC

## 2019-02-17 MED ORDER — MORPHINE SULFATE (PF) 4 MG/ML IV SOLN
4.0000 mg | Freq: Once | INTRAVENOUS | Status: AC
  Administered 2019-02-17: 11:00:00 4 mg via INTRAVENOUS

## 2019-02-17 MED ORDER — MORPHINE SULFATE (PF) 4 MG/ML IV SOLN
INTRAVENOUS | Status: AC
  Administered 2019-02-17: 4 mg via INTRAVENOUS
  Filled 2019-02-17: qty 1

## 2019-02-17 MED ORDER — CEPHALEXIN 500 MG PO CAPS
500.0000 mg | ORAL_CAPSULE | Freq: Once | ORAL | Status: AC
  Administered 2019-02-17: 500 mg via ORAL
  Filled 2019-02-17: qty 1

## 2019-02-17 MED ORDER — TETANUS-DIPHTH-ACELL PERTUSSIS 5-2.5-18.5 LF-MCG/0.5 IM SUSP
0.5000 mL | Freq: Once | INTRAMUSCULAR | Status: AC
  Administered 2019-02-17: 0.5 mL via INTRAMUSCULAR
  Filled 2019-02-17: qty 0.5

## 2019-02-17 NOTE — ED Notes (Signed)
CT called this RN. Patient in CT scanner but cannot tolerate laying down flat. MD made aware. Morphine ordered. Will administer in Radiology

## 2019-02-17 NOTE — ED Notes (Signed)
Dr.Bero at bedside to suture nose

## 2019-02-17 NOTE — ED Notes (Signed)
Patient returned from radiology. Per report, patient was unable to tolerate lying flat for CT scan. MD made aware.

## 2019-02-17 NOTE — Progress Notes (Signed)
Bilateral lower extremity venous duplex has been completed. Preliminary results can be found in CV Proc through chart review.  Results were given to Dr. Ashok Cordia.  02/17/19 8:54 AM Brian [name] RVT

## 2019-02-17 NOTE — Discharge Instructions (Addendum)
It was our pleasure to provide your ER care today - we hope that you feel better.  Keep sutured area very clean/dry. Avoid blowing nose. Keep head elevated as much as possible, even while sleeping, to help with swelling. Icepack/cold to sore area.   Take antibiotic as prescribed (keflex).   Take acetaminophen as need for pain.  Have sutures removed, your doctor or urgent care, in 7-10 days.   If problems w nose/nasal fracture, you may follow up with ENT doctor in the next 1-2 weeks.   Return to ER if worse, new symptoms, fevers, infection of wound (spreading redness, pus, etc.), new or severe pain, increased trouble breathing, weak/fainting, chest pain, or other concern.

## 2019-02-17 NOTE — ED Notes (Signed)
Main radiology dept called for patient re-scan of CT. States, will be here soon to pick up patient.

## 2019-02-17 NOTE — ED Provider Notes (Signed)
Signed out by Dr Sedonia Small to d/c to home if/when imaging is resulted. Also that if imaging shows nasal fx, to place on abx therapy due to overlying laceration which was already sutured.   Imaging studies reviewed - nasal bone fx, otherwise neg for acute process. Discussed w pt. Keflex po.  Patient/family note that apart from fall, pt otherwise appears at baseline. Specifically discussed breathing - they indicate at baseline, pt w dyspnea w exertion, and they/pcp are aware, but that there has been no acute worsening of sob. No current or recent chest pain or discomfort. No increasing cough. No fevers.   Patient requests d/c to home, stating that apart from fall/face contusion, that he feels at baseline.   Return precautions provided.      Lajean Saver, MD 02/17/19 1234

## 2019-02-17 NOTE — ED Provider Notes (Signed)
[facility] Emergency Department Provider Note MRN:  [id number]  Arrival date & time: 02/17/19     Chief Complaint   Fall and Facial Injury   History of Present Illness   Brian [name] is a 85 y.o. year-old male with a history of diabetes, hypertension presenting to the ED with chief complaint of fall.  At 4 AM, patient tripped on a rug and fell forward striking his face against the ground.  No loss of consciousness.,  No nausea or vomiting since the fall.  Noted laceration to the nose.  Endorsing mild neck pain, moderate low back pain.  Denies bowel or bladder dysfunction, no numbness or weakness to the arms or legs.  No blood thinners.  Unsure of last shot.  Review of Systems  A complete 10 system review of systems was obtained and all systems are negative except as noted in the HPI and PMH.   Patient's Health History    Past Medical History:  Diagnosis Date  . Depression   . Diabetes mellitus without complication ([place])   . Hyperlipemia   . Hypertension     Past Surgical History:  Procedure Laterality Date  . MEDIAL PARTIAL KNEE REPLACEMENT      History reviewed. No pertinent family history.  Social History   Socioeconomic History  . Marital status: Widowed    Spouse name: Not on file  . Number of children: Not on file  . Years of education: Not on file  . Highest education level: Not on file  Occupational History  . Not on file  Social Needs  . Financial resource strain: Not on file  . Food insecurity    Worry: Not on file    Inability: Not on file  . Transportation needs    Medical: Not on file    Non-medical: Not on file  Tobacco Use  . Smoking status: Never Smoker  . Smokeless tobacco: Never Used  Substance and Sexual Activity  . Alcohol use: Not on file  . Drug use: Not on file  . Sexual activity: Not on file  Lifestyle  . Physical activity    Days per week: Not on file    Minutes per session: Not on file  . Stress: Not on  file  Relationships  . Social Herbalist on phone: Not on file    Gets together: Not on file    Attends religious service: Not on file    Active member of club or organization: Not on file    Attends meetings of clubs or organizations: Not on file    Relationship status: Not on file  . Intimate partner violence    Fear of current or ex partner: Not on file    Emotionally abused: Not on file    Physically abused: Not on file    Forced sexual activity: Not on file  Other Topics Concern  . Not on file  Social History Narrative  . Not on file     Physical Exam  Vital Signs and Nursing Notes reviewed Vitals:   02/17/19 0525 02/17/19 0543  BP: 115/87   Pulse: 96   Resp: 20   Temp:  98 F (36.7 C)  SpO2: 94%     CONSTITUTIONAL: Well-appearing, NAD NEURO:  Alert and oriented x 3, no focal deficits EYES:  eyes equal and reactive ENT/NECK:  no LAD, no JVD CARDIO: Regular rate, well-perfused, normal S1 and S2 PULM:  CTAB no wheezing or rhonchi  GI/GU:  normal bowel sounds, non-distended, non-tender MSK/SPINE:  No gross deformities, no edema SKIN: Linear laceration to the right side of the nose, bruising beneath bilateral eyes PSYCH:  Appropriate speech and behavior  Diagnostic and Interventional Summary    EKG Interpretation  Date/Time:    Ventricular Rate:    PR Interval:    QRS Duration:   QT Interval:    QTC Calculation:   R Axis:     Text Interpretation:        Labs Reviewed - No data to display  CT Maxillofacial Wo Contrast    (Results Pending)  CT Cervical Spine    (Results Pending)  CT Head    (Results Pending)  CT Lumbar Spine Wo Contrast    (Results Pending)  VAS [place] LOWER EXTREMITY VENOUS (DVT) (ONLY [facility] & [facility])    (Results Pending)    Medications  Tdap (BOOSTRIX) injection 0.5 mL (0.5 mLs Intramuscular Given 02/17/19 0646)  lidocaine (PF) (XYLOCAINE) 1 % injection 5 mL (5 mLs Other Given 02/17/19 0646)     Procedures  /  Critical Care  .Marland KitchenLaceration Repair  Date/Time: 02/17/2019 7:18 AM Performed by: Maudie Flakes, MD Authorized by: Maudie Flakes, MD   Consent:    Consent obtained:  Verbal   Consent given by:  Patient   Risks discussed:  Infection, need for additional repair, pain and poor cosmetic result Anesthesia (see MAR for exact dosages):    Anesthesia method:  Local infiltration   Local anesthetic:  Lidocaine 1% w/o epi Laceration details:    Location:  Face   Face location:  Nose   Length (cm):  5   Depth (mm):  4 Repair type:    Repair type:  Intermediate Pre-procedure details:    Preparation:  Patient was prepped and draped in usual sterile fashion Exploration:    Wound exploration: wound explored through full range of motion and entire depth of wound probed and visualized     Contaminated: no   Treatment:    Area cleansed with:  Saline   Amount of cleaning:  Extensive   Irrigation method:  Syringe Subcutaneous repair:    Suture size:  5-0   Suture material:  Vicryl   Number of sutures:  1 Skin repair:    Repair method:  Sutures   Suture size:  5-0   Wound skin closure material used: fast absorbing vicryl.   Number of sutures:  9 Approximation:    Approximation:  Close Post-procedure details:    Dressing:  Open (no dressing)   Patient tolerance of procedure:  Tolerated well, no immediate complications    ED Course and Medical Decision Making  I have reviewed the triage vital signs and the nursing notes.  Pertinent labs & imaging results that were available during my care of the patient were reviewed by me and considered in my medical decision making (see below for details).     CT imaging to exclude intracranial bleeding, facial fractures, spinal fractures.  Tetanus updated, laceration repaired as described above.  Patient also endorsing increasing leg pain and swelling for the past 1 or 2 weeks, left greater than right.  PCP told him that imaging to look for blood clots would be  appropriate.  Requesting ultrasound today.  Discussed that this could be performed as an outpatient but they prefer today.  Oncoming provider to follow-up imaging results.  Anticipating discharge.    Barth Kirks. Sedonia Small, [place]  [email or web address]  Final Clinical Impressions(s) / ED Diagnoses     ICD-10-CM   1. Facial injury, initial encounter  S09.93XA   2. Facial laceration, initial encounter  S01.81XA   3. Leg swelling  M79.89     ED Discharge Orders    None       Discharge Instructions Discussed with and Provided to Patient:   Discharge Instructions   None       Maudie Flakes, MD 02/17/19 [phone]

## 2019-05-20 ENCOUNTER — Ambulatory Visit (INDEPENDENT_AMBULATORY_CARE_PROVIDER_SITE_OTHER): Payer: Medicare PPO | Admitting: Internal Medicine

## 2019-05-20 ENCOUNTER — Other Ambulatory Visit: Payer: Self-pay

## 2019-05-20 ENCOUNTER — Encounter: Payer: Self-pay | Admitting: Internal Medicine

## 2019-05-20 VITALS — BP 138/72 | HR 81 | Temp 97.3°F | Ht 69.0 in | Wt 220.0 lb

## 2019-05-20 DIAGNOSIS — I872 Venous insufficiency (chronic) (peripheral): Secondary | ICD-10-CM

## 2019-05-20 DIAGNOSIS — M159 Polyosteoarthritis, unspecified: Secondary | ICD-10-CM

## 2019-05-20 DIAGNOSIS — I10 Essential (primary) hypertension: Secondary | ICD-10-CM

## 2019-05-20 DIAGNOSIS — M8949 Other hypertrophic osteoarthropathy, multiple sites: Secondary | ICD-10-CM

## 2019-05-20 DIAGNOSIS — E1169 Type 2 diabetes mellitus with other specified complication: Secondary | ICD-10-CM | POA: Diagnosis not present

## 2019-05-20 DIAGNOSIS — G629 Polyneuropathy, unspecified: Secondary | ICD-10-CM | POA: Diagnosis not present

## 2019-05-20 DIAGNOSIS — R0600 Dyspnea, unspecified: Secondary | ICD-10-CM | POA: Diagnosis not present

## 2019-05-20 DIAGNOSIS — N138 Other obstructive and reflux uropathy: Secondary | ICD-10-CM

## 2019-05-20 DIAGNOSIS — E785 Hyperlipidemia, unspecified: Secondary | ICD-10-CM

## 2019-05-20 DIAGNOSIS — R0609 Other forms of dyspnea: Secondary | ICD-10-CM

## 2019-05-20 DIAGNOSIS — N401 Enlarged prostate with lower urinary tract symptoms: Secondary | ICD-10-CM

## 2019-05-20 DIAGNOSIS — M4004 Postural kyphosis, thoracic region: Secondary | ICD-10-CM

## 2019-05-20 DIAGNOSIS — B353 Tinea pedis: Secondary | ICD-10-CM

## 2019-05-20 NOTE — Patient Instructions (Addendum)
Try athlete's foot over-the-counter lotrimin cream each morning after bathing and before applying your compression hose.    We'll get you lung tests to make sure you do not need more inhalers for your breathing.

## 2019-05-20 NOTE — Progress Notes (Signed)
Provider:  Rexene Edison. Mariea Clonts, D.O., C.M.D. Location:   [place]  Place of Service:   clinic  Previous PCP: Gayland Curry, DO [name] Care Team: Gayland Curry, DO as PCP - General (Geriatric Medicine) Myrlene Broker, MD as Attending Physician (Urology) Ralene Bathe, MD (Ophthalmology)  Extended Emergency Contact Information Primary Emergency Contact: Astarita,[name]  [place] Phone: [phone] Relation: [name]  Goals of Care: Advanced Directive information Advanced Directives 05/20/2019  Does Brian [name] Have a Medical Advance Directive? No  Type of Advance Directive -  Does Brian [name] want to make changes to medical advance directive? Yes (ED - Information included in AVS)  Copy of [place] in Chart? -  Would Brian [name] like information on creating a medical advance directive? -  Has living will, but HCPOA not yet set up per paperwork  Chief Complaint  Brian [name] presents with  . Establish Care    New Brian [name] to establish care . Brian [name] is with him     HPI: Brian [name] is a 86 y.o. male seen today to establish with [facility].  Records have been requested from Dr. Werner Lean in [place] on [address].  He was referred by [facility] and research by Brian daughter-in-law.Brian [name], Brian [name], felt like it made sense for him to have a doctor closer by.  Had to go to [facility] about carpal tunnel test and for bladder US and it's been a lot for Brian dad to get around to those places.    He lives just 4.5 miles from here.  Brian dentist is here, urologist is here, too, now-Dr. Rosana Hoes.  Derm is in [name] and they'd like to come closer--Dr. Frederico Hamman.  Had melanoma 5 years ago--needs the annual skin exam.  Eye doctor is also here at [facility].    He has challenges with a lot of fluid, redness, and not getting better.  He was put on a diuretic at the last visit.  It did not help the fluid.  First reduced Brian antidepressant and then took him  off Brian depression med.  He's been having some swelling even a couple of years ago with the left ankle fx.  Right ankle actually swells more.  Legs will sting every now and then.  Has very dry skin.  Using coconut oil for it.    HTN:  BP well controlled, but he reports it's high for Brian norm--usually 120/65.  Not getting dizzy or weak.  Med for BP was dropped and they didn't know.      Hyperlipidemia:  Takes fenofibrate 145mg  for that.    He's not had fasting labs in two years.  Diabetes: takes metformin.  hba1c 7 in October.  Very rare occasions, might get a little low if he's active.  Fixed with glass of OJ.    In oct/nov '19, he fell in the yard and broke Brian ankle.  He wore a boot for 6 wks, no surgery.  Thanksgiving morning of '20, he fell and busted Brian nose open pretty good.  Had ED visit with sutures.  Had a lot of swelling under Brian eyes.  Did have imaging to make sure nothing else broken in Brian face outside of the nose.  He's using the walker for safety due to that fall.  They did get higher toilets and grab bars and a ramp in front of the house.  They've decluttered a little bit  He woke up and got up too fast with  that fall.  He also had an [place] of legs then that was negative for clots.    He's been home alone since Brian wife died.  They have someone come in 6 hrs a day to help with meals, clothes, laundry, cleaning.  Pt says bossing him around.  He did have PT which helps him temporarily (2x).  The last time, they did not do PT b/c of covid.  He does have some exercises at home that he is not too religious about doing.    BPH with urinary obstruction:  He's been told things are ok with urology.  On average, gets up once, maybe twice at the most, at night.  Rarely leaks urine if waits too long. Had recent bladder US that was ok.  He was having to wait to fully empty Brian bladder.    He gets out of breath easily.  Sounds like this has been going for about 3 years--does resolve quickly if he  rests.  He has spinal curvature and some abdominal fat.  Gets winded putting shoes on.   He does Brian own bathing, dressing.  He has a grab bar and some tape in the shower to keep him from slipping.  Does not want to use the shower chair.  He cannot sit to bathe on Brian own.   Compression stockings were recommended and he cannot put them on.  Even normal socks and shoes are hard to put on.  He has a 45 degree wedge to elevate Brian feet.    Had right partial knee replacement in 2013/07/04.  Echo in July 04, 2016 showed EF 60%, mild aortic sclerosis.  If he's distracted eating, he might get something stuck about once a week at most.    Since the pandemic, he feels like he's slipping mentally some.  He did a test and got along fine.  He used to work puzzles.  They will rarely go out to eat after church.  He's a people guy, loves kids and talking to people.    Uses a 1/2 cup milk of mag nightly for constipation.  Also eats an apple daily.  Has a little hemorrhoid that kicks up now and then--he will skip Brian baby asa for a few days and then it's ok again.  He had Brian first covid vaccine moderna on 2/13 and second is 3/13.    Smoked in college until 07/04/1965.  He's highly allergic to smoke now.  Lily spores also make him sick.  Hayfever may bother just a little.    He was at [employer] [phone].    Brian wife passed away in 07/05/2014 and he's not been the same since.  She did die peacefully at home.    He has a living will.  They are working on the [employer] done.   He has a life alert button.   He has albuterol inhaler--may use three times a week.  Wheezes and sneezes.  Has had a runny nose for three years.  6 yrs ago, had nose bleeds.  One was pouring and ENT saw him and got it cauterized--had procedure to redo.   No change since fall and fracture.  No pain.  Has a wrist splint on the left from the carpal tunnel.  Says he'll go back to wearing if he has to but he's not having numbness in Brian first  three digits.    Brian [name] asked about screening for colon cancer at Brian age (they'd already been told  it's not recommended and I agreed).  Past Medical History:  Diagnosis Date  . Benign prostatic hyperplasia with urinary obstruction   . Depression   . Diabetes mellitus without complication ([place])   . Edema    edema in lower legs  . Hyperlipemia   . Hypertension    Past Surgical History:  Procedure Laterality Date  . HERNIA REPAIR     1960's  . MEDIAL PARTIAL KNEE REPLACEMENT  2015  . sub mucus  2015    Social History   Socioeconomic History  . Marital status: Widowed    Spouse name: [name]   . Number of children: 1  . Years of education: 7  . Highest education level: Doctorate  Occupational History  . Not on file  Tobacco Use  . Smoking status: Former Smoker    Years: 10.00    Types: Cigarettes    Quit date: 1967    Years since quitting: 54.1  . Smokeless tobacco: Never Used  Substance and Sexual Activity  . Alcohol use: Not Currently  . Drug use: Never  . Sexual activity: Not on file  Other Topics Concern  . Not on file  Social History Narrative   Social History      Diet? Low sodium       Do you drink/eat things with caffeine? No       Marital status?                            Widowed         What year were you married? 1958      Do you live in a house, apartment, assisted living, condo, trailer, etc.? house      Is it one or more stories? One       How many persons live in your home? One       Do you have any pets in your home? (please list) no       Highest level of education completed? Graduate degree divinity [employer]       Current or past profession: [employer]       Do you exercise?               No                        Type & how often?      Advanced Directives      Do you have a living will? yes      Do you have a DNR form?                                  If not, do you want to discuss one? No       Do you have  signed POA/HPOA for forms? No       Functional Status      Do you have difficulty bathing or dressing yourself? Yes       Do you have difficulty preparing food or eating? Yes       Do you have difficulty managing your medications? No       Do you have difficulty managing your finances? No       Do you have difficulty affording your medications?no       Social Determinants of Health   Financial Resource Strain:   .  Difficulty of Paying Living Expenses: Not on file  Food Insecurity:   . Worried About Charity fundraiser in the Last Year: Not on file  . Ran Out of Food in the Last Year: Not on file  Transportation Needs:   . Lack of Transportation (Medical): Not on file  . Lack of Transportation (Non-Medical): Not on file  Physical Activity:   . Days of Exercise per Week: Not on file  . Minutes of Exercise per Session: Not on file  Stress:   . Feeling of Stress : Not on file  Social Connections:   . Frequency of Communication with Friends and Family: Not on file  . Frequency of Social Gatherings with Friends and Family: Not on file  . Attends Religious Services: Not on file  . Active Member of Clubs or Organizations: Not on file  . Attends Archivist Meetings: Not on file  . Marital Status: Not on file    reports that he quit smoking about 54 years ago. Brian smoking use included cigarettes. He quit after 10.00 years of use. He has never used smokeless tobacco. He reports previous alcohol use. He reports that he does not use drugs.  Functional Status Survey:    Family History  Problem Relation Age of Onset  . Pancreatic cancer Father   . Stomach cancer Sister     Health Maintenance  Topic Date Due  . URINE MICROALBUMIN  12/26/1941  . TETANUS/TDAP  02/16/2029  . INFLUENZA VACCINE  Completed  . PNA vac Low Risk Adult  Completed    Allergies  Allergen Reactions  . Beta Adrenergic Blockers     "they make me depressed"    Outpatient Encounter Medications  as of 05/20/2019  Medication Sig  . albuterol (VENTOLIN HFA) 108 (90 Base) MCG/ACT inhaler Inhale 1 puff into the lungs as needed for wheezing or shortness of breath.  Marland Kitchen aspirin 81 MG tablet Take 81 mg by mouth daily.  . fenofibrate (TRICOR) 145 MG tablet Take 145 mg by mouth daily.  . finasteride (PROSCAR) 5 MG tablet Take 5 mg by mouth daily.  . magnesium hydroxide (MILK OF MAGNESIA) 400 MG/5ML suspension 1/2 capful by mouth every night  . metFORMIN (GLUCOPHAGE) 500 MG tablet Take by mouth 2 (two) times daily with a meal.   No facility-administered encounter medications on file as of 05/20/2019.    Review of Systems  Constitutional: Negative for chills, fever and malaise/fatigue.  HENT: Positive for hearing loss. Negative for congestion and sore throat.        Mild in right ear  Eyes: Negative for blurred vision.  Respiratory: Positive for shortness of breath and wheezing. Negative for cough and sputum production.   Cardiovascular: Positive for leg swelling. Negative for chest pain, palpitations, orthopnea and PND.  Gastrointestinal: Positive for constipation. Negative for abdominal pain, blood in stool, diarrhea and melena.       About weekly difficulty with food getting stuck if not focused when eating  Genitourinary: Negative for dysuria.  Musculoskeletal: Positive for back pain, falls and joint pain.       Kyphosis, bone spurs in back  Skin: Positive for rash.  Neurological: Positive for tingling, sensory change and weakness. Negative for dizziness and loss of consciousness.       In feet  Endo/Heme/Allergies: Positive for environmental allergies.  Psychiatric/Behavioral: Positive for memory loss. Negative for depression. The Brian [name] is not nervous/anxious and does not have insomnia.  He reports Brian cognition has slowed down since the pandemic and decreased mental stimulation    Vitals:   05/20/19 1117  BP: 138/72  Pulse: 81  Temp: (!) 97.3 F (36.3 C)  SpO2: 96%   Weight: 220 lb (99.8 kg)  Height: 5\' 9"  (1.753 m)   Body mass index is 32.49 kg/m. Physical Exam Vitals reviewed.  Constitutional:      General: He is not in acute distress.    Appearance: Normal appearance. He is not ill-appearing or toxic-appearing.  HENT:     Head: Normocephalic and atraumatic.     Right Ear: Tympanic membrane, ear canal and external ear normal.     Left Ear: Tympanic membrane, ear canal and external ear normal.     Nose: Rhinorrhea present. No congestion.     Mouth/Throat:     Pharynx: Oropharynx is clear. No oropharyngeal exudate or posterior oropharyngeal erythema.  Eyes:     Extraocular Movements: Extraocular movements intact.     Conjunctiva/sclera: Conjunctivae normal.     Pupils: Pupils are equal, round, and reactive to light.     Comments: Glasses for reading  Cardiovascular:     Rate and Rhythm: Normal rate and regular rhythm.     Pulses: Normal pulses.     Heart sounds: Normal heart sounds. No murmur. No friction rub. No gallop.   Pulmonary:     Effort: Pulmonary effort is normal.     Breath sounds: Normal breath sounds. No wheezing, rhonchi or rales.     Comments: Visible dyspnea on exertion when bends over to take shoes and socks off and when getting up on exam table from chair Abdominal:     General: Bowel sounds are normal. There is no distension.     Palpations: Abdomen is soft. There is no mass.     Tenderness: There is no abdominal tenderness. There is no guarding or rebound.  Musculoskeletal:        General: Normal range of motion.     Cervical back: Neck supple.     Right lower leg: Edema present.     Left lower leg: Edema present.     Comments: Thoracic kyphosis; ambulates with rolling walker with skis  Lymphadenopathy:     Cervical: No cervical adenopathy.  Skin:    General: Skin is warm and dry.     Capillary Refill: Capillary refill takes less than 2 seconds.  Neurological:     General: No focal deficit present.     Mental  Status: He is alert and oriented to person, place, and time.     Cranial Nerves: No cranial nerve deficit.     Sensory: Sensory deficit present.     Motor: Weakness present.     Coordination: Coordination normal.     Gait: Gait abnormal.     Deep Tendon Reflexes: Reflexes normal.     Comments: Diminished sensation to both feet, left greater than right (was unable to feel monofilament at all left, only slightly on right but not specific to location)  Psychiatric:        Mood and Affect: Mood normal.        Behavior: Behavior normal.        Thought Content: Thought content normal.        Judgment: Judgment normal.    Diabetic foot exam was performed with the following findings:   Intact posterior tibialis and dorsalis pedis pulses Thickened toenails--they are neatly trimmed; has scaly red areas on feet and  erythema and hyperpigmentation of lower legs, no pitting, but has edema bilaterally right greater than left     Labs reviewed: Labs labs 10/20 in care everywhere at [facility] prior PCP, Dr. Benjamine Sprague  Imaging and Procedures noted on new Brian [name] packet: EMG/NCS 2020 Bladder US 2020 1991 hospitalization "double pneumonia"  Assessment/Plan 1. Dyspnea on exertion - I suspect this may be from deconditioning; however, with Brian use of albuterol tiw for dyspnea/wheezing, I think it makes sense to get a set of PFTs, walk test to be sure he does not have additional pulmonary pathology -CXR last year at [facility] did not mention any emphysema or bronchitis  - Pulmonary function test; Future  2. Essential hypertension -bp is at goal w/o meds for this  3. Dyslipidemia associated with type 2 diabetes mellitus ([place]) -cont fenofibrate and check flp before next visit  4. Neuropathy -suspect due to Brian diabetes; f/u hba1c before next visit -this is considerable and I recommended he avoid driving except very limited (he's not been anyway and Brian [name] and daughter-in-law are getting him  around)  5. Primary osteoarthritis involving multiple joints -not on any regular arthritis meds -did not really c/o joint pains so much as the bone spurs and kyphosis of Brian back  6. Postural kyphosis of thoracic region -ongoing, bothers him if he lies completely flat  7. Benign prostatic hyperplasia with urinary obstruction -cont finasteride for this and following with Dr. Rosana Hoes  8.  Chronic venous insufficiency -elevate feet at rest -faithful use of compression hose after bathing -f/u in 6 wks  9.  Athlete's foot -apply lotrimin cream daily after bathing and before compression hose -monitor for improvement of dry scaly red skin  Labs/tests ordered:  Lab Orders     CBC with Differential/Platelet     COMPLETE METABOLIC PANEL WITH GFR     Hemoglobin A1c     Lipid panel     TSH     Vitamin B12     VITAMIN D 25 Hydroxy (Vit-D Deficiency, Fractures)  F/u in 6 wks on breathing and edema with fasting labs before  Jentri Aye L. Jerrie Gullo, D.O. [facility] [address], [place] [address] Cell Phone (Mon-Fri 8am-5pm):  [phone] On Call:  [phone] & follow prompts after 5pm & weekends Office Phone:  [phone] Office Fax:  [phone]

## 2019-06-17 ENCOUNTER — Encounter: Payer: Self-pay | Admitting: Internal Medicine

## 2019-06-17 DIAGNOSIS — G5600 Carpal tunnel syndrome, unspecified upper limb: Secondary | ICD-10-CM | POA: Insufficient documentation

## 2019-06-17 DIAGNOSIS — G250 Essential tremor: Secondary | ICD-10-CM | POA: Insufficient documentation

## 2019-06-17 DIAGNOSIS — N320 Bladder-neck obstruction: Secondary | ICD-10-CM

## 2019-06-28 ENCOUNTER — Other Ambulatory Visit: Payer: Self-pay

## 2019-06-28 ENCOUNTER — Other Ambulatory Visit: Payer: Medicare PPO

## 2019-06-28 DIAGNOSIS — E785 Hyperlipidemia, unspecified: Secondary | ICD-10-CM

## 2019-06-28 DIAGNOSIS — G629 Polyneuropathy, unspecified: Secondary | ICD-10-CM

## 2019-06-28 DIAGNOSIS — I10 Essential (primary) hypertension: Secondary | ICD-10-CM

## 2019-06-28 DIAGNOSIS — M4004 Postural kyphosis, thoracic region: Secondary | ICD-10-CM

## 2019-06-28 DIAGNOSIS — R0609 Other forms of dyspnea: Secondary | ICD-10-CM

## 2019-06-29 LAB — CBC WITH DIFFERENTIAL/PLATELET
Absolute Monocytes: 673 cells/uL (ref 200–950)
Basophils Absolute: 40 cells/uL (ref 0–200)
Basophils Relative: 0.6 %
Eosinophils Absolute: 59 cells/uL (ref 15–500)
Eosinophils Relative: 0.9 %
HCT: 45.6 % (ref 38.5–50.0)
Hemoglobin: 14.7 g/dL (ref 13.2–17.1)
Lymphs Abs: 1313 cells/uL (ref 850–3900)
MCH: 31.5 pg (ref 27.0–33.0)
MCHC: 32.2 g/dL (ref 32.0–36.0)
MCV: 97.6 fL (ref 80.0–100.0)
MPV: 11.8 fL (ref 7.5–12.5)
Monocytes Relative: 10.2 %
Neutro Abs: 4514 cells/uL (ref 1500–7800)
Neutrophils Relative %: 68.4 %
Platelets: 192 10*3/uL (ref 140–400)
RBC: 4.67 10*6/uL (ref 4.20–5.80)
RDW: 12.1 % (ref 11.0–15.0)
Total Lymphocyte: 19.9 %
WBC: 6.6 10*3/uL (ref 3.8–10.8)

## 2019-06-29 LAB — COMPLETE METABOLIC PANEL WITH GFR
AG Ratio: 1.5 (calc) (ref 1.0–2.5)
ALT: 16 U/L (ref 9–46)
AST: 14 U/L (ref 10–35)
Albumin: 4 g/dL (ref 3.6–5.1)
Alkaline phosphatase (APISO): 56 U/L (ref 35–144)
BUN: 22 mg/dL (ref 7–25)
CO2: 34 mmol/L — ABNORMAL HIGH (ref 20–32)
Calcium: 9.8 mg/dL (ref 8.6–10.3)
Chloride: 100 mmol/L (ref 98–110)
Creat: 1.03 mg/dL (ref 0.70–1.11)
GFR, Est African American: 76 mL/min/{1.73_m2} (ref >=60)
GFR, Est Non African American: 66 mL/min/{1.73_m2} (ref >=60)
Globulin: 2.6 g/dL (calc) (ref 1.9–3.7)
Glucose, Bld: 190 mg/dL — ABNORMAL HIGH (ref 65–99)
Potassium: 4.4 mmol/L (ref 3.5–5.3)
Sodium: 138 mmol/L (ref 135–146)
Total Bilirubin: 0.5 mg/dL (ref 0.2–1.2)
Total Protein: 6.6 g/dL (ref 6.1–8.1)

## 2019-06-29 LAB — TSH: TSH: 1.61 mIU/L (ref 0.40–4.50)

## 2019-06-29 LAB — VITAMIN D 25 HYDROXY (VIT D DEFICIENCY, FRACTURES): Vit D, 25-Hydroxy: 22 ng/mL — ABNORMAL LOW (ref 30–100)

## 2019-06-29 LAB — LIPID PANEL
Cholesterol: 230 mg/dL — ABNORMAL HIGH (ref <200)
HDL: 58 mg/dL (ref >=40)
LDL Cholesterol (Calc): 138 mg/dL (calc) — ABNORMAL HIGH
Non-HDL Cholesterol (Calc): 172 mg/dL (calc) — ABNORMAL HIGH (ref <130)
Total CHOL/HDL Ratio: 4 (calc) (ref <5.0)
Triglycerides: 203 mg/dL — ABNORMAL HIGH (ref <150)

## 2019-06-29 LAB — HEMOGLOBIN A1C
Hgb A1c MFr Bld: 8 % of total Hgb — ABNORMAL HIGH (ref <5.7)
Mean Plasma Glucose: 183 (calc)
eAG (mmol/L): 10.1 (calc)

## 2019-06-29 LAB — VITAMIN B12: Vitamin B-12: 723 pg/mL (ref 200–1100)

## 2019-06-29 NOTE — Progress Notes (Signed)
Vitamin D level is quite low (22 with goal of about 40-80 in older adults).  I recommend he begin a Vitamin D3 supplement 2000 IU daily.  [employer] is a good choice.  They have very tiny tablets that are easy to swallow. B12 level was ok (goal over 500) TSH was in normal range Cholesterol was elevated, but triglycerides appear much improved from his last test 9 months ago.  We'll discuss a plan at the appt. Unfortunately, the hba1c went up to 8 from 6.7.  I think we can get that down and we'll talk about that at his visit, as well.

## 2019-07-01 ENCOUNTER — Other Ambulatory Visit: Payer: Self-pay

## 2019-07-01 ENCOUNTER — Ambulatory Visit (INDEPENDENT_AMBULATORY_CARE_PROVIDER_SITE_OTHER): Payer: Medicare PPO | Admitting: Internal Medicine

## 2019-07-01 ENCOUNTER — Encounter: Payer: Self-pay | Admitting: Internal Medicine

## 2019-07-01 VITALS — BP 120/62 | HR 78 | Temp 97.8°F | Ht 69.0 in | Wt 247.0 lb

## 2019-07-01 DIAGNOSIS — R2689 Other abnormalities of gait and mobility: Secondary | ICD-10-CM | POA: Diagnosis not present

## 2019-07-01 DIAGNOSIS — M4004 Postural kyphosis, thoracic region: Secondary | ICD-10-CM

## 2019-07-01 DIAGNOSIS — Z9889 Other specified postprocedural states: Secondary | ICD-10-CM | POA: Diagnosis not present

## 2019-07-01 DIAGNOSIS — E1069 Type 1 diabetes mellitus with other specified complication: Secondary | ICD-10-CM

## 2019-07-01 DIAGNOSIS — E785 Hyperlipidemia, unspecified: Secondary | ICD-10-CM

## 2019-07-01 DIAGNOSIS — E1169 Type 2 diabetes mellitus with other specified complication: Secondary | ICD-10-CM

## 2019-07-01 DIAGNOSIS — Z85820 Personal history of malignant melanoma of skin: Secondary | ICD-10-CM

## 2019-07-01 NOTE — Progress Notes (Signed)
Location:  [facility] clinic Provider:  Tekeyah Santiago L. Mariea Clonts, D.O., C.M.D.  Goals of Care:  Advanced Directives 07/01/2019  Does Patient Have a Medical Advance Directive? No  Type of Advance Directive -  Does patient want to make changes to medical advance directive? No - Patient declined  Copy of [place] in Chart? -  Would patient like information on creating a medical advance directive? No - Patient declined  they're working on [employer] Complaint  Patient presents with   Medical Management of Chronic Issues    6 week follow up     HPI: Patient is a 86 y.o. male seen today for medical management of chronic diseases.     Swelling is some better.  Right worse than left.  Scaly skin is a little better.  Olivia Mackie, their weekday help, puts the cream on at that time.    He does not urinate near as much w/o the diuretic.  He's continued taking the diuretic except "sundays.  Probably taking 4-5 times a week.    His son says he did well with therapy, but he can't go with a mask.    He is now feeling congested, nose itchy.  Also has itchy eyes.  They talk about how he had the trauma to his nose.    Had moderna covid vaccines--second was 3/13.    Also concerned about his balance.    They ask to have a local dermatologist also.  He had a prior melanoma on his back b/w his shoulder blades. It was removed in 2008. He's had a few other precancerous areas on hands, arms.  CBGs are up from 125 to 159.   They are trying to back off soda pops--he's addicted to Dr. Peppers.  Diet is reasonably good otherwise.  They splurge every now and then.    Breakfast:  Small bowl bran with skim milk, reduced sugar oj, piece of wheat toast with sugar-free jelly and light margarine, one egg, one piece of turkey bacon, fruit.  Over half the time, he skips lunch--may eat a few nuts and cherry tomatoes and may have banana.  Has early supper:  Meat, sweet potato, salad with light or fat-free  dressing, lima beans.   1-2 times a week, they do go out to eat--[employer] or [employer] d[employer], etc.    I suggested vitamin D3 daily and turns out he's taking some and some b12 I didn't know about.    Past Medical History:  Diagnosis Date   Benign prostatic hyperplasia with urinary obstruction    Bladder neck obstruction 02/02/2012   Colon polyps 09/01/2011   Depression    Diabetes mellitus without complication (HCC) 09/01/2011   Dyslipidemia associated with type 2 diabetes mellitus (HCC)    Edema    edema in lower legs   Hyperlipemia    Hypertension 09/01/2011   Kyphosis deformity of spine    Lung disease     Past Surgical History:  Procedure Laterality Date   HERNIA REPAIR     19" 60's   MEDIAL PARTIAL KNEE REPLACEMENT  2015   melanoma removed from back  2008   sub mucus  2015    Allergies  Allergen Reactions   Lopressor [Metoprolol Tartrate]     Mental status changes (intolerance)    Beta Adrenergic Blockers     "they make me depressed"    Outpatient Encounter Medications as of 07/01/2019  Medication Sig   albuterol (VENTOLIN HFA) 108 (90 Base) MCG/ACT  inhaler Inhale 1 puff into the lungs as needed for wheezing or shortness of breath.   aspirin 81 MG tablet Take 81 mg by mouth daily.   citalopram (CELEXA) 20 MG tablet Take 20 mg by mouth daily.   fenofibrate (TRICOR) 145 MG tablet Take 145 mg by mouth daily.   finasteride (PROSCAR) 5 MG tablet Take 5 mg by mouth daily.   fluticasone (FLONASE) 50 MCG/ACT nasal spray Place 1 spray into both nostrils daily.   lisinopril (ZESTRIL) 10 MG tablet Take 10 mg by mouth daily.   magnesium hydroxide (MILK OF MAGNESIA) 400 MG/5ML suspension 1/2 capful by mouth every night   meloxicam (MOBIC) 15 MG tablet Take 15 mg by mouth daily.   metFORMIN (GLUCOPHAGE) 500 MG tablet Take by mouth 2 (two) times daily with a meal.   No facility-administered encounter medications on file as of 07/01/2019.    Review of  Systems:  Review of Systems  Constitutional: Negative for chills and fever.  HENT: Positive for congestion and hearing loss. Negative for sore throat.        Itchy nose and eyes  Eyes: Negative for blurred vision.  Respiratory: Positive for shortness of breath. Negative for cough and wheezing.   Cardiovascular: Positive for orthopnea and leg swelling. Negative for chest pain, palpitations, claudication and PND.  Gastrointestinal: Negative for abdominal pain, blood in stool, constipation, diarrhea and melena.  Genitourinary: Positive for frequency. Negative for dysuria.  Musculoskeletal: Negative for falls.  Skin: Positive for rash. Negative for itching.       Redness and scaliness of legs and feet improving gradually  Neurological: Positive for sensory change. Negative for dizziness and loss of consciousness.  Endo/Heme/Allergies: Bruises/bleeds easily.  Psychiatric/Behavioral: Positive for memory loss. Negative for depression. The patient is not nervous/anxious and does not have insomnia.     Health Maintenance  Topic Date Due   OPHTHALMOLOGY EXAM  08/05/2019 (Originally 12/26/1941)   INFLUENZA VACCINE  10/23/2019   HEMOGLOBIN A1C  12/28/2019   FOOT EXAM  05/19/2020   TETANUS/TDAP  02/16/2029   PNA vac Low Risk Adult  Completed    Physical Exam: Vitals:   07/01/19 1127  BP: 120/62  Pulse: 78  Temp: 97.8 F (36.6 C)  TempSrc: Temporal  SpO2: 96%  Weight: 247 lb (112 kg)  Height: 5\' 9"  (1.753 m)   Body mass index is 36.48 kg/m. Physical Exam Vitals reviewed.  Constitutional:      General: He is not in acute distress.    Appearance: Normal appearance. He is obese. He is not toxic-appearing.  HENT:     Head: Normocephalic and atraumatic.  Eyes:     Extraocular Movements: Extraocular movements intact.     Pupils: Pupils are equal, round, and reactive to light.  Cardiovascular:     Rate and Rhythm: Normal rate and regular rhythm.     Pulses: Normal pulses.      Heart sounds: Normal heart sounds.  Pulmonary:     Effort: Pulmonary effort is normal.     Breath sounds: No wheezing.  Abdominal:     General: Bowel sounds are normal. There is no distension.     Palpations: Abdomen is soft.     Tenderness: There is no abdominal tenderness. There is no guarding or rebound.  Musculoskeletal:        General: Normal range of motion.     Right lower leg: Edema present.     Left lower leg: Edema present.  Comments: Left greater than right edema  Skin:    General: Skin is warm and dry.     Capillary Refill: Capillary refill takes less than 2 seconds.     Comments: Erythema of both lower legs with dry scaly skin (less than last time)  Neurological:     General: No focal deficit present.     Mental Status: He is alert and oriented to person, place, and time.     Gait: Gait abnormal.     Comments: Uses rolling walker with skis  Psychiatric:        Mood and Affect: Mood normal.        Behavior: Behavior normal.        Thought Content: Thought content normal.        Judgment: Judgment normal.     Comments: Very pleasant, short-term memory loss (repeats questions some)     Labs reviewed: Basic Metabolic Panel: Recent Labs    09/27/18 0000 06/28/19 1021  NA 136* 138  K 5.1 4.4  CL 100 100  CO2 31* 34*  GLUCOSE  --  190*  BUN 21 22  CREATININE 1.0 1.03  CALCIUM 10.1 9.8  TSH  --  1.61   Liver Function Tests: Recent Labs    09/27/18 0000 06/28/19 1021  AST 17 14  ALT 16 16  BILITOT  --  0.5  PROT  --  6.6  ALBUMIN 4.3  --    No results for input(s): LIPASE, AMYLASE in the last 8760 hours. No results for input(s): AMMONIA in the last 8760 hours. CBC: Recent Labs    09/27/18 0000 06/28/19 1021  WBC 7.4 6.6  NEUTROABS  --  4,514  HGB 14.5 14.7  HCT 44 45.6  MCV  --  97.6  PLT 212 192   Lipid Panel: Recent Labs    09/27/18 0000 06/28/19 1021  CHOL  --  230*  HDL 60 58  LDLCALC 126 138*  TRIG 324* 203*  CHOLHDL  --   4.0   Lab Results  Component Value Date   HGBA1C 8.0 (H) 06/28/2019    Assessment/Plan 1. H/O melanoma excision - needs his twice a year derm checks to be moved to [place] (had gone in [place]) - Ambulatory referral to Dermatology  2. Postural kyphosis of thoracic region -ongoing, will need to monitor bone density  - Ambulatory referral to Physical Therapy  3. Balance problem -ongoing, will replete vitamin D deficiency by increasing vitamin D3 to 4000 units daily and encouraged 10-20 mins of daily sunshine - Ambulatory referral to Physical Therapy  4. Dyslipidemia associated with type 2 diabetes mellitus ([place]) -cont tricor, crestor--despite these, his LDL and triglycerides are STILL elevated - CBC with Differential/Platelet; Future - COMPLETE METABOLIC PANEL WITH GFR; Future - Hemoglobin A1c; Future  5. Diabetes mellitus with other complication (HCC) - hyperlipidemia as above -control is fair given his advanced age -he remains active and goes to church and events so would prefer to keep his hba1c under 8 -discussed decreasing his Dr. Malachi Bonds and fast food - CBC with Differential/Platelet; Future - Hemoglobin A1c; Future  Labs/tests ordered:   Lab Orders     CBC with Differential/Platelet     COMPLETE METABOLIC PANEL WITH GFR     Hemoglobin A1c  Still needs PFTs ordered last visit  Has eye exam scheduled 08/05/19  Next appt:  10/31/2019  Ashraf Mesta L. Deanda Ruddell, D.O. [facility] [address],  [place] [address] Cell Phone (Mon-Fri 8am-5pm):  [phone] On Call:  [phone] & follow prompts after 5pm & weekends Office Phone:  [phone] Office Fax:  [phone]

## 2019-07-01 NOTE — Patient Instructions (Addendum)
I recommend the compression hose during the daytime.   Elevate your feet at rest.   Try to move as much as you can. Skip the water pill for now.    Call me back with your vitamin D and B12 dosages.   Increase vitamin D3 to 4000 units daily.    No ENT No essential oils

## 2019-07-08 ENCOUNTER — Other Ambulatory Visit (HOSPITAL_COMMUNITY)
Admission: RE | Admit: 2019-07-08 | Discharge: 2019-07-08 | Disposition: A | Discharge status: Home / Self Care | Payer: Medicare PPO | Source: Ambulatory Visit | Attending: Internal Medicine | Admitting: Internal Medicine

## 2019-07-08 DIAGNOSIS — Z01812 Encounter for preprocedural laboratory examination: Secondary | ICD-10-CM | POA: Insufficient documentation

## 2019-07-08 DIAGNOSIS — Z20822 Contact with and (suspected) exposure to covid-19: Secondary | ICD-10-CM | POA: Insufficient documentation

## 2019-07-08 LAB — SARS CORONAVIRUS 2 (TAT 6-24 HRS): SARS Coronavirus 2: NEGATIVE

## 2019-07-12 ENCOUNTER — Ambulatory Visit (INDEPENDENT_AMBULATORY_CARE_PROVIDER_SITE_OTHER): Payer: Medicare PPO | Admitting: Internal Medicine

## 2019-07-12 ENCOUNTER — Other Ambulatory Visit: Payer: Self-pay

## 2019-07-12 DIAGNOSIS — R0600 Dyspnea, unspecified: Secondary | ICD-10-CM | POA: Diagnosis not present

## 2019-07-12 DIAGNOSIS — R0609 Other forms of dyspnea: Secondary | ICD-10-CM

## 2019-07-12 LAB — PULMONARY FUNCTION TEST
DL/VA % pred: 181 %
DL/VA: 6.91 ml/min/mmHg/L
DLCO cor % pred: 94 %
DLCO cor: 22.52 ml/min/mmHg
DLCO unc % pred: 94 %
DLCO unc: 22.52 ml/min/mmHg
FEF 25-75 Post: 1.67 L/sec
FEF 25-75 Pre: 1.08 L/sec
FEF2575-%Change-Post: 54 %
FEF2575-%Pred-Post: 96 %
FEF2575-%Pred-Pre: 62 %
FEV1-%Change-Post: 8 %
FEV1-%Pred-Post: 54 %
FEV1-%Pred-Pre: 50 %
FEV1-Post: 1.45 L
FEV1-Pre: 1.34 L
FEV1FVC-%Change-Post: 7 %
FEV1FVC-%Pred-Pre: 110 %
FEV6-%Change-Post: 0 %
FEV6-%Pred-Post: 49 %
FEV6-%Pred-Pre: 48 %
FEV6-Post: 1.74 L
FEV6-Pre: 1.73 L
FEV6FVC-%Pred-Post: 107 %
FEV6FVC-%Pred-Pre: 107 %
FVC-%Change-Post: 0 %
FVC-%Pred-Post: 45 %
FVC-%Pred-Pre: 45 %
FVC-Post: 1.74 L
FVC-Pre: 1.73 L
Post FEV1/FVC ratio: 83 %
Post FEV6/FVC ratio: 100 %
Pre FEV1/FVC ratio: 78 %
Pre FEV6/FVC Ratio: 100 %

## 2019-07-12 NOTE — Progress Notes (Signed)
I have printed out Mr. Keener pulmonary function tests so I can review them more easily.  Can you please put them in my review and sign folder to look at Thursday?  Thanks!

## 2019-07-12 NOTE — Progress Notes (Signed)
PFT done today. 

## 2019-07-20 ENCOUNTER — Encounter: Payer: Self-pay | Admitting: Physical Therapy

## 2019-07-20 ENCOUNTER — Ambulatory Visit: Payer: Medicare PPO | Attending: Internal Medicine | Admitting: Physical Therapy

## 2019-07-20 ENCOUNTER — Other Ambulatory Visit: Payer: Self-pay

## 2019-07-20 DIAGNOSIS — R2689 Other abnormalities of gait and mobility: Secondary | ICD-10-CM | POA: Diagnosis present

## 2019-07-20 DIAGNOSIS — M6281 Muscle weakness (generalized): Secondary | ICD-10-CM | POA: Insufficient documentation

## 2019-07-20 DIAGNOSIS — R293 Abnormal posture: Secondary | ICD-10-CM | POA: Diagnosis present

## 2019-07-20 NOTE — Patient Instructions (Signed)
Access Code: [email or web address]: [email or web address]: 04/28/2021Prepared by: Anderson Malta PaaExercises  Supine Shoulder Flexion Extension AAROM with Dowel - 2 x daily - 7 x weekly - 1 sets - 10 reps - 5 hold  Supine Shoulder Horizontal Abduction with Resistance - 2 x daily - 7 x weekly - 2 sets - 10 reps - 5 hold  Seated Scapular Retraction - 2 x daily - 7 x weekly - 2 sets - 10 reps - 5 hold  Corner Pec Major Stretch - 2 x daily - 7 x weekly - 1 sets - 5 reps - 30 hold  Supine Lower Trunk Rotation - 2 x daily - 7 x weekly - 2 sets - 10 reps - 10 hold

## 2019-07-20 NOTE — Therapy (Signed)
[place], [place], [address] Phone: [phone]   Fax:  [phone]  Physical Therapy Evaluation  Patient Details  Name: Brian [name] MRN: [id number] Date of Birth: [birth year] Referring Provider (PT): Hollace Kinnier, DO    Encounter Date: 07/20/2019  PT End of Session - 07/20/19 1631    Visit Number  1    Number of Visits  16    Date for PT Re-Evaluation  09/16/19    PT Start Time  1217    PT Stop Time  1310    PT Time Calculation (min)  53 min    Equipment Utilized During Treatment  Gait belt    Activity Tolerance  Patient tolerated treatment well;Patient limited by fatigue    Behavior During Therapy  [facility] for tasks assessed/performed       Past Medical History:  Diagnosis Date  . Benign prostatic hyperplasia with urinary obstruction   . Bladder neck obstruction 02/02/2012  . Colon polyps 09/01/2011  . Depression   . Diabetes mellitus without complication ([place]) 99991111  . Dyslipidemia associated with type 2 diabetes mellitus ([place])   . Edema    edema in lower legs  . Hyperlipemia   . Hypertension 09/01/2011  . Kyphosis deformity of spine   . Lung disease   . Melanoma ([place])    on back    Past Surgical History:  Procedure Laterality Date  . HERNIA REPAIR     1960's  . MEDIAL PARTIAL KNEE REPLACEMENT  2015  . melanoma removed from back  2008  . sub mucus  2015    There were no vitals filed for this visit.   Subjective Assessment - 07/20/19 1219    Subjective  Patient reports increased balance issues and progressive weakness.  He feels his posture is worsening.  He had a bad fall on Thanksgiving (was walking prior with a cane prior) The pandemic has played a role.  He has pain when he tries to sit up straight.  But I'm not IN pain.    Pertinent History  lymphedema,COPD, neuropathy, OA, diabetes, and HTN, postural deformity    Limitations  Lifting;Standing;Walking;House hold  activities;Other (comment)   ADLs, gets out of breath easily   How long can you sit comfortably?  unlimited in recliner or slumped    How long can you stand comfortably?  no pain with 20 min    How long can you walk comfortably?  limited by fatigue not pain    Diagnostic tests  none recent    Patient Stated Goals  I want to walk without this walker    Currently in Pain?  No/denies    Pain Score  0-No pain    Pain Location  Back    Pain Orientation  Upper    Pain Descriptors / Indicators  Aching;Tightness    Pain Type  Chronic pain    Pain Onset  More than a month ago    Pain Frequency  Intermittent    Aggravating Factors   spinal extension    Pain Relieving Factors  resting forward    Effect of Pain on Daily Activities  worsening balance    Multiple Pain Sites  No         [facility] PT Assessment - 07/20/19 0001      Assessment   Medical Diagnosis  thoracic kyphosis, gait/balance     Referring Provider (PT)  Hollace Kinnier, DO     Onset Date/Surgical Date  --  chronic    Prior Therapy  Yes       Precautions   Precautions  Fall      Restrictions   Weight Bearing Restrictions  No      Balance Screen   Has the patient fallen in the past 6 months  Yes    How many times?  1    Has the patient had a decrease in activity level because of a fear of falling?   Yes    Is the patient reluctant to leave their home because of a fear of falling?   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, friends, goes somewhere about everyday "unless its raining"      Cognition   Overall Cognitive Status  Within Functional Limits for tasks assessed    Attention  Alternating    Alternating Attention  Appears intact    Memory  Appears intact    Awareness  Impaired    Awareness Impairment  Anticipatory impairment    Problem Solving  Appears intact    Behaviors  Other (comment)   does admit to stress and isolation due to pandemic      Observation/Other Assessments   Focus on Therapeutic Outcomes (FOTO)   NT was not in system       Observation/Other Assessments-Edema    Edema  --   bilateral LE edema, in PT for this elsewhere     Sensation   Light Touch  Appears Intact    Additional Comments  Neuropathy, bilateral and edema       Functional Tests   Functional tests  Other      Other:   Other/ Comments  can bend forward to floor in sitting       Posture/Postural Control   Posture/Postural Control  Postural limitations    Postural Limitations  Rounded Shoulders;Forward head;Increased thoracic kyphosis;Posterior pelvic tilt;Flexed trunk    Posture Comments  head lateral flexed R and rotated L       AROM   Overall AROM Comments  lacks about 30-40 deg extension in thoracic spine extension , NT rotation    Right Shoulder Flexion  120 Degrees    Left Shoulder Flexion  110 Degrees      Strength   Right Shoulder Flexion  4/5    Right Shoulder ABduction  4/5    Left Shoulder Flexion  4/5    Left Shoulder ABduction  4/5    Right Hip Flexion  3+/5    Left Hip Flexion  3+/5    Right Knee Flexion  4+/5    Right Knee Extension  4+/5    Left Knee Flexion  4+/5    Left Knee Extension  4+/5    Right Ankle Dorsiflexion  3+/5    Left Ankle Dorsiflexion  3+/5      Bed Mobility   Bed Mobility  Supine to Sit;Sit to Supine    Supine to Sit  Independent with assistive device    Sit to Supine  Independent with  assistive device      Transfers   Five time sit to stand comments   16 sec   dyspnea post    Comments  supervision no device and no UE from 90 deg       Ambulation/Gait   Ambulation Distance (Feet)  260 Feet    Assistive device  Rolling walker    Gait Pattern  Festinating;Antalgic;Trunk flexed;Poor foot clearance - left;Poor foot clearance - right    Ambulation Surface  Level;Indoor                Objective measurements completed on examination: See above findings.      [facility] Adult PT Treatment/Exercise - 07/20/19 0001      Ambulation/Gait   Gait Comments  2 MWT 260 feet, min A with walker O2 sat down to 92 % , HR 95, cues fo safety, posture       Self-Care   Self-Care  Posture;Other Self-Care Comments    Posture  seated, standing, walking     Other Self-Care Comments   safety with RW, fall risk, HEP and POC       Shoulder Exercises: Supine   Horizontal ABduction Weight (lbs)  no band x 10 in semireclined     Shoulder Flexion Weight (lbs)  wand x 10 overhead     Other Supine Exercises  retraction x 10       Shoulder Exercises: Stretch   Corner Stretch  2 reps;30 seconds    Corner Stretch Limitations  cues to decr intensity                PT Short Term Goals - 07/20/19 1632      PT SHORT TERM GOAL #1   Title  Patient demonstrates understanding of initial HEP for posture, balance    Time  4    Period  Weeks    Status  New    Target Date  08/17/19      PT SHORT TERM GOAL #2   Title  Patient verbalizes understanding of fall prevention strategies.     Time  4    Period  Weeks    Status  New    Target Date  08/17/19      PT SHORT TERM GOAL #3   Title  Pt will ambulate with LRAD and Mod I , 300 feet in community, no cues needed for safety.    Time  4    Period  Weeks    Status  New    Target Date  08/17/19      PT SHORT TERM GOAL #4   Title  Pt will submit ABC balance confidence scale, be screened for balance issues (DGI, Berg, TUG) and LTG  set    Time  2    Period  Weeks    Status  New    Target Date  08/03/19        PT Long Term Goals - 07/20/19 1634      PT LONG TERM GOAL #1   Title  Patient demonstrates & verbalizes ongoing fitness plan / HEP    Time  8    Period  Weeks    Status  New    Target Date  09/16/19  PT LONG TERM GOAL #2   Title  Balance goals TBA based on current function.    Time  8    Period  Weeks    Status  New    Target Date  09/16/19      PT LONG TERM GOAL #3   Title  Patient ambulates with LRAD for balance & pain >800 feet in 6 minutes (6 min walk test) modified independent.    Baseline  2 min walk test 260 feet, min A at times and cues for safety    Time  8    Period  Weeks    Status  New    Target Date  09/16/19      PT LONG TERM GOAL #4   Title  Pt will demonstrate corrected posture with min tension/pain in upper back    Time  8    Period  Weeks    Status  New    Target Date  09/16/19      PT LONG TERM GOAL #5   Title  Pt will make progress towards gait safety and independence with cane in his home, requiring less cues for objects and potential hazards.    Time  8    Period  Weeks    Status  New    Target Date  09/16/19             Plan - 07/20/19 1639    Clinical Impression Statement  Patient presents for mod complexity evaluation of progressing thoracic kyphosis and gait instability.  He has been to PT prior with good results. He has pain only when sitting upright.  He shows reduced activity tolerance, LE and UE weakness and trunk, hip stiffness.  After a significant fall in Nov.  2020 he has reduced activity and would like to go back to using his cane.  Currently he needs  walker with cueing for safety.  He will have limited progress in his posture but may gain mobility and strength to positively impact his quality of life and independence.    Personal Factors and Comorbidities  Age;Time since onset of injury/illness/exacerbation;Comorbidity 3+    Comorbidities   HTN, diabetes, neuropathy, see other    Examination-Activity Limitations  Carry;Dressing;Stairs;Squat;Sit;Bathing;Lift;Stand;Locomotion Level;Bed Mobility;Reach Overhead    Examination-Participation Restrictions  Church;Community Activity;Interpersonal Relationship    Stability/Clinical Decision Making  Evolving/Moderate complexity    Clinical Decision Making  Moderate    Rehab Potential  Good    PT Frequency  2x / week    PT Duration  8 weeks    PT Treatment/Interventions  ADLs/Self Care Home Management;Therapeutic activities;Patient/family education;Taping;Therapeutic exercise;DME Instruction;Moist Heat;Cryotherapy;Functional mobility training;Manual techniques;Passive range of motion;Neuromuscular re-education;Balance training;Gait training;Stair training    PT Next Visit Plan  check HEP, Balance screen.  Collect ABC score, set goals?  eventual [place]    PT Home Exercise Plan  corner stretch, supine flexion and horiz abd (yellow) , Lower turnk flexion    Consulted and Agree with Plan of Care  Patient       Patient will benefit from skilled therapeutic intervention in order to improve the following deficits and impairments:  Abnormal gait, Decreased knowledge of use of DME, Increased fascial restricitons, Impaired sensation, Improper body mechanics, Pain, Postural dysfunction, Decreased mobility, Cardiopulmonary status limiting activity, Decreased activity tolerance, Decreased range of motion, Decreased strength, Hypomobility, Impaired UE functional use, Impaired flexibility, Difficulty walking, Decreased balance, Decreased safety awareness  Visit Diagnosis: Abnormal posture  Muscle weakness (generalized)  Other abnormalities of  gait and mobility     Problem List Patient Active Problem List   Diagnosis Date Noted  . Carpal tunnel syndrome 06/17/2019  . Benign head tremor 06/17/2019  . Pain of joint of left ankle and foot 01/27/2018  . Infection of olecranon bursa 10/03/2015  .  Olecranon bursitis, left elbow 09/26/2015  . Benign prostatic hyperplasia with urinary obstruction 02/02/2015  . Diabetes mellitus without complication ([place]) [id number]  . Bladder neck obstruction 02/02/2012  . Essential hypertension 09/01/2011  . Kyphosis 09/01/2011  . Neuropathy 09/01/2011  . Osteoarthritis 09/01/2011  . Colon polyps 09/01/2011  . S/P right unicompartmental knee replacement 09/01/2011    Fernie Grimm 07/20/2019, 4:51 PM  [place], [place], [address] Phone: [phone]   Fax:  [phone]  Name: GAVYNN POLE MRN: [id number] Date of Birth: Aug 01, 1931   Raeford Razor, PT 07/20/19 4:52 PM Phone: [phone] Fax: [phone]

## 2019-07-26 ENCOUNTER — Telehealth: Payer: Self-pay | Admitting: Internal Medicine

## 2019-07-26 DIAGNOSIS — I872 Venous insufficiency (chronic) (peripheral): Secondary | ICD-10-CM

## 2019-07-26 DIAGNOSIS — I10 Essential (primary) hypertension: Secondary | ICD-10-CM

## 2019-07-26 DIAGNOSIS — R0609 Other forms of dyspnea: Secondary | ICD-10-CM

## 2019-07-26 DIAGNOSIS — R0600 Dyspnea, unspecified: Secondary | ICD-10-CM

## 2019-07-26 NOTE — Telephone Encounter (Signed)
Brian [name] is in agreement of the EKG and the Chest X-ray.

## 2019-07-26 NOTE — Telephone Encounter (Signed)
I have reviewed Mr. Woodhull pulmonary function tests.  They are consistent with restrictive lung disease due to his kyphosis (spinal curvature) and obesity.  I discussed his case with my colleague who has practiced in pulmonary.  He recommends we get a chest xray and echocardiogram to evaluate him further.  If Josph Macho and his dad are in agreement, I will place orders for these tests.  The xray can be done at their convenience after I order it at [facility].  The echo will be scheduled.

## 2019-07-26 NOTE — Addendum Note (Signed)
Addended by: Gayland Curry on: 07/26/2019 04:16 PM   Modules accepted: Orders

## 2019-07-26 NOTE — Telephone Encounter (Signed)
Orders for CXR and echo have been entered.  Pt may go to [facility] for CXR at his convenience.

## 2019-07-27 NOTE — Telephone Encounter (Signed)
I left message for Josph Macho to call back

## 2019-07-28 ENCOUNTER — Other Ambulatory Visit: Payer: Self-pay

## 2019-07-28 ENCOUNTER — Ambulatory Visit: Payer: Medicare PPO | Attending: Internal Medicine | Admitting: Physical Therapy

## 2019-07-28 ENCOUNTER — Encounter: Payer: Self-pay | Admitting: Physical Therapy

## 2019-07-28 DIAGNOSIS — R293 Abnormal posture: Secondary | ICD-10-CM | POA: Diagnosis present

## 2019-07-28 DIAGNOSIS — M6281 Muscle weakness (generalized): Secondary | ICD-10-CM | POA: Diagnosis present

## 2019-07-28 DIAGNOSIS — R2689 Other abnormalities of gait and mobility: Secondary | ICD-10-CM | POA: Diagnosis present

## 2019-07-28 NOTE — Telephone Encounter (Signed)
Talked with Brian [name] and he is going to get his Chest x-ray when fred comes home.

## 2019-07-28 NOTE — Therapy (Signed)
[place], [place], [address] Phone: [phone]   Fax:  [phone]  Physical Therapy Treatment  Patient Details  Name: Brian [name] MRN: [id number] Date of Birth: Feb 26, 1932 Referring Provider (PT): Hollace Kinnier, DO    Encounter Date: 07/28/2019  PT End of Session - 07/28/19 1148    Visit Number  2    Number of Visits  16    Date for PT Re-Evaluation  09/16/19    PT Start Time  [id number]    PT Stop Time  1230    PT Time Calculation (min)  45 min       Past Medical History:  Diagnosis Date  . Benign prostatic hyperplasia with urinary obstruction   . Bladder neck obstruction 02/02/2012  . Colon polyps 09/01/2011  . Depression   . Diabetes mellitus without complication ([facility]) 99991111  . Dyslipidemia associated with type 2 diabetes mellitus ([facility])   . Edema    edema in lower legs  . Hyperlipemia   . Hypertension 09/01/2011  . Kyphosis deformity of spine   . Lung disease   . Melanoma ([place])    on back    Past Surgical History:  Procedure Laterality Date  . HERNIA REPAIR     1960's  . MEDIAL PARTIAL KNEE REPLACEMENT  2015  . melanoma removed from back  2008  . sub mucus  2015    There were no vitals filed for this visit.                    [facility] Adult PT Treatment/Exercise - 07/28/19 0001      Standardized Balance Assessment   Standardized Balance Assessment  Berg Balance Test      Berg Balance Test   Sit to Stand  Able to stand without using hands and stabilize independently   from  mat table    Standing Unsupported  Able to stand safely 2 minutes    Sitting with Back Unsupported but Feet Supported on Floor or Stool  Able to sit safely and securely 2 minutes    Stand to Sit  Sits safely with minimal use of hands    Transfers  Able to transfer safely, definite need of hands   needs hand from standard chair   Standing Unsupported with Eyes Closed  Able to stand 10  seconds with supervision    Standing Ubsupported with Feet Together  Able to place feet together independently and stand for 1 minute with supervision    From Standing, Reach Forward with Outstretched Arm  Can reach forward >12 cm safely (5")    From Standing Position, Pick up Object from Floor  Able to pick up shoe, needs supervision    From Standing Position, Turn to Look Behind Over each Shoulder  Looks behind one side only/other side shows less weight shift    Turn 360 Degrees  Able to turn 360 degrees safely but slowly    Standing Unsupported, Alternately Place Feet on Step/Stool  Able to complete 4 steps without aid or supervision    Standing Unsupported, One Foot in Front  Able to plae foot ahead of the other independently and hold 30 seconds    Standing on One Leg  Tries to lift leg/unable to hold 3 seconds but remains standing independently    Total Score  42      Shoulder Exercises: Supine   Horizontal ABduction Weight (lbs)  no band x 10 in semireclined  Shoulder Flexion Weight (lbs)  wand x 10 overhead     Other Supine Exercises  retraction x 10       Shoulder Exercises: ROM/Strengthening   Other ROM/Strengthening Exercises  seated scap retract and W back x 10 each     Other ROM/Strengthening Exercises  sit-stand x 10, needs UE after firest 2 reps      Shoulder Exercises: Stretch   Corner Stretch  5 reps;10 seconds    Corner Stretch Limitations  each leg forward- cues needs for correct technique     Other Shoulder Stretches  LTR x 10 for 10 sec , opp head turn                PT Short Term Goals - 07/20/19 1632      PT SHORT TERM GOAL #1   Title  Patient demonstrates understanding of initial HEP for posture, balance    Time  4    Period  Weeks    Status  New    Target Date  08/17/19      PT SHORT TERM GOAL #2   Title  Patient verbalizes understanding of fall prevention strategies.     Time  4    Period  Weeks    Status  New    Target Date  08/17/19       PT SHORT TERM GOAL #3   Title  Pt will ambulate with LRAD and Mod I , 300 feet in community, no cues needed for safety.    Time  4    Period  Weeks    Status  New    Target Date  08/17/19      PT SHORT TERM GOAL #4   Title  Pt will submit ABC balance confidence scale, be screened for balance issues (DGI, Berg, TUG) and LTG set    Time  2    Period  Weeks    Status  New    Target Date  08/03/19        PT Long Term Goals - 07/20/19 1634      PT LONG TERM GOAL #1   Title  Patient demonstrates & verbalizes ongoing fitness plan / HEP    Time  8    Period  Weeks    Status  New    Target Date  09/16/19      PT LONG TERM GOAL #2   Title  Balance goals TBA based on current function.    Time  8    Period  Weeks    Status  New    Target Date  09/16/19      PT LONG TERM GOAL #3   Title  Patient ambulates with LRAD for balance & pain >800 feet in 6 minutes (6 min walk test) modified independent.    Baseline  2 min walk test 260 feet, min A at times and cues for safety    Time  8    Period  Weeks    Status  New    Target Date  09/16/19      PT LONG TERM GOAL #4   Title  Pt will demonstrate corrected posture with min tension/pain in upper back    Time  8    Period  Weeks    Status  New    Target Date  09/16/19      PT LONG TERM GOAL #5   Title  Pt will make progress towards gait safety and independence  with cane in his home, requiring less cues for objects and potential hazards.    Time  8    Period  Weeks    Status  New    Target Date  09/16/19            Plan - 07/28/19 1216    Clinical Impression Statement  BERG 42/56. Reviewed HEP with minor cues for technique required.    PT Next Visit Plan  check HEP, Balance screen.  Collect ABC score (Berg 42/56, need TUG and DGI), set goals?  eventual Nustep    PT Home Exercise Plan  corner stretch, supine flexion and horiz abd (yellow) , Lower turnk flexion       Patient will benefit from skilled therapeutic  intervention in order to improve the following deficits and impairments:  Abnormal gait, Decreased knowledge of use of DME, Increased fascial restricitons, Impaired sensation, Improper body mechanics, Pain, Postural dysfunction, Decreased mobility, Cardiopulmonary status limiting activity, Decreased activity tolerance, Decreased range of motion, Decreased strength, Hypomobility, Impaired UE functional use, Impaired flexibility, Difficulty walking, Decreased balance, Decreased safety awareness  Visit Diagnosis: Abnormal posture  Muscle weakness (generalized)  Other abnormalities of gait and mobility     Problem List Patient Active Problem List   Diagnosis Date Noted  . Carpal tunnel syndrome 06/17/2019  . Benign head tremor 06/17/2019  . Pain of joint of left ankle and foot 01/27/2018  . Infection of olecranon bursa 10/03/2015  . Olecranon bursitis, left elbow 09/26/2015  . Benign prostatic hyperplasia with urinary obstruction 02/02/2015  . Diabetes mellitus without complication ([facility]) [id number]  . Bladder neck obstruction 02/02/2012  . Essential hypertension 09/01/2011  . Kyphosis 09/01/2011  . Neuropathy 09/01/2011  . Osteoarthritis 09/01/2011  . Colon polyps 09/01/2011  . S/P right unicompartmental knee replacement 09/01/2011    [name], PTA 07/28/2019, 1:00 PM  [facility] [address] [place], [place], [address] Phone: [phone]   Fax:  [phone]  Name: Brian [name] MRN: [id number] Date of Birth: 1931/11/29

## 2019-08-02 ENCOUNTER — Ambulatory Visit: Payer: Medicare PPO | Admitting: Physical Therapy

## 2019-08-04 ENCOUNTER — Other Ambulatory Visit: Payer: Self-pay | Admitting: Internal Medicine

## 2019-08-04 ENCOUNTER — Telehealth: Payer: Self-pay

## 2019-08-04 ENCOUNTER — Encounter: Payer: Self-pay | Admitting: Family

## 2019-08-04 ENCOUNTER — Ambulatory Visit: Payer: Medicare PPO | Admitting: Physical Therapy

## 2019-08-04 ENCOUNTER — Ambulatory Visit
Admission: RE | Admit: 2019-08-04 | Discharge: 2019-08-04 | Disposition: A | Discharge status: Home / Self Care | Payer: Medicare PPO | Source: Ambulatory Visit | Attending: Internal Medicine | Admitting: Internal Medicine

## 2019-08-04 ENCOUNTER — Ambulatory Visit (INDEPENDENT_AMBULATORY_CARE_PROVIDER_SITE_OTHER): Payer: Medicare PPO | Admitting: Family

## 2019-08-04 ENCOUNTER — Other Ambulatory Visit: Payer: Self-pay

## 2019-08-04 VITALS — Temp 97.8°F | Resp 16 | Ht 69.0 in | Wt 244.4 lb

## 2019-08-04 DIAGNOSIS — M4004 Postural kyphosis, thoracic region: Secondary | ICD-10-CM

## 2019-08-04 DIAGNOSIS — J069 Acute upper respiratory infection, unspecified: Secondary | ICD-10-CM

## 2019-08-04 DIAGNOSIS — R0600 Dyspnea, unspecified: Secondary | ICD-10-CM

## 2019-08-04 DIAGNOSIS — R0609 Other forms of dyspnea: Secondary | ICD-10-CM

## 2019-08-04 MED ORDER — DOXYCYCLINE HYCLATE 100 MG PO TABS: 100.0000 mg | ORAL_TABLET | Freq: Two times a day (BID) | ORAL | 0 refills | Status: DC

## 2019-08-04 MED ORDER — SACCHAROMYCES BOULARDII 250 MG PO CAPS: 250.0000 mg | ORAL_CAPSULE | Freq: Two times a day (BID) | ORAL | 0 refills | Status: AC

## 2019-08-04 NOTE — Telephone Encounter (Signed)
I spoke to [name], and he needed the location of where his dad needs to go for the x-Ray. Brian [name] is here now to see Brian [name]. I sent her a message to put in for the chest X-ray and they are willing to get it done after his visit today. [name] Brian [name] this is the message from [name].    Please call Brian [name] and remind him that his dad needs his chest xray done. The order I originally put in actually has expired so we'll have to reenter

## 2019-08-04 NOTE — Telephone Encounter (Signed)
-----   Message from Gayland Curry, DO sent at 08/03/2019  8:10 PM EDT ----- Please call Josph Macho and remind him that his dad needs his chest xray done.  The order I originally put in actually has expired so we'll have to reenter.   ----- Message ----- From: SYSTEM Sent: 07/31/2019  12:08 AM EDT To: Gayland Curry, DO

## 2019-08-04 NOTE — Telephone Encounter (Signed)
Patients son called to find out where and when his fathers appointment was for a chest x-ray he said was ordered for his father I checked the system didn't see an order for a chest x-ray or EKG for patient I saw an order for and Echocardiogram  But patient has an appointment today so I told him he could ask provider about this today he agreed

## 2019-08-04 NOTE — Progress Notes (Signed)
Provider: Zafirah Vanzee FNP-C  Gayland Curry, DO  Patient Care Team: Gayland Curry, DO as PCP - General (Geriatric Medicine) Myrlene Broker, MD as Attending Physician (Urology) Ralene Bathe, MD (Ophthalmology) [name], MD as Referring Physician  Extended Emergency Contact Information Primary Emergency Contact: Bitner,[name]  [place] Phone: [phone] Relation: Son  Code Status: Full Code  Goals of care: Advanced Directive information Advanced Directives 08/04/2019  Does Patient Have a Medical Advance Directive? Yes  Type of Paramedic of [place];Living will  Does patient want to make changes to medical advance directive? No - Patient declined  Copy of [place] in Chart? No - copy requested  Would patient like information on creating a medical advance directive? -     Chief Complaint  Patient presents with  . Acute Visit    Congestion, and Productive Cough    HPI:  Pt is a 86 y.o. male seen today for an acute visit for evaluation of cough and congestion x 1 week.He is here with his care giver.He states cough and runny nose drainage started off clear and then yellow.Has had some wheezing but has not used his Albuterol.nose feels congested.Care giver states patient gets short winded when he walks long distance.He has had no fever, chills.or sore throat.He denies worsening leg swelling or weight gain. Thinks symptoms started after he went out to a store in [address] to get compression stockings.The store had no air conditioner and felt hot.   He states working with Physical Therapy for gait stability.walks using a walk. CBG in the 120's-150's.    Past Medical History:  Diagnosis Date  . Benign prostatic hyperplasia with urinary obstruction   . Bladder neck obstruction 02/02/2012  . Colon polyps 09/01/2011  . Depression   . Diabetes mellitus without complication ([facility]) [id number]  .  Dyslipidemia associated with type 2 diabetes mellitus ([place])   . Edema    edema in lower legs  . Hyperlipemia   . Hypertension 09/01/2011  . Kyphosis deformity of spine   . Lung disease   . Melanoma ([facility] [facility])    on back   Past Surgical History:  Procedure Laterality Date  . HERNIA REPAIR     1960's  . MEDIAL PARTIAL KNEE REPLACEMENT  2015  . melanoma removed from back  2008  . sub mucus  2015    Allergies  Allergen Reactions  . Lopressor [Metoprolol Tartrate]     Mental status changes (intolerance)   . Beta Adrenergic Blockers     "they make me depressed"    Outpatient Encounter Medications as of 08/04/2019  Medication Sig  . ACCU-CHEK GUIDE test strip   . albuterol (VENTOLIN HFA) 108 (90 Base) MCG/ACT inhaler Inhale 1 puff into the lungs as needed for wheezing or shortness of breath.  Marland Kitchen aspirin 81 MG tablet Take 81 mg by mouth daily.  . citalopram (CELEXA) 20 MG tablet Take 20 mg by mouth daily.  . fenofibrate (TRICOR) 145 MG tablet Take 145 mg by mouth daily.  . finasteride (PROSCAR) 5 MG tablet Take 5 mg by mouth daily.  . fluticasone (FLONASE) 50 MCG/ACT nasal spray Place 1 spray into both nostrils daily.  Marland Kitchen lisinopril (ZESTRIL) 10 MG tablet Take 10 mg by mouth daily.  . magnesium hydroxide (MILK OF MAGNESIA) 400 MG/5ML suspension 1/2 capful by mouth every night  . meloxicam (MOBIC) 15 MG tablet Take 15 mg by mouth daily.  . metFORMIN (GLUCOPHAGE)  500 MG tablet Take by mouth 2 (two) times daily with a meal.  . OVER THE COUNTER MEDICATION Take 1 capsule by mouth 2 (two) times daily. Equate NiteTime Cold and Flu. Multi symptom Relief.  Aches, Fever: Acetaminophen Cough: Dextromethorphan HBr Sneezing & runny nose: Doxylamine Succinate.   No facility-administered encounter medications on file as of 08/04/2019.    Review of Systems  Constitutional: Negative for appetite change, chills, fatigue and fever.  HENT: Positive for congestion and rhinorrhea. Negative for sinus  pressure, sinus pain and sneezing.   Eyes: Negative for photophobia, discharge, redness and itching.  Respiratory: Positive for wheezing. Negative for cough, chest tightness and shortness of breath.   Cardiovascular: Positive for leg swelling. Negative for chest pain and palpitations.  Gastrointestinal: Negative for abdominal distention, abdominal pain, constipation, diarrhea, nausea and vomiting.  Genitourinary: Negative for difficulty urinating, dysuria, flank pain, frequency and hematuria.  Musculoskeletal: Positive for gait problem.  Skin: Negative for color change, pallor and rash.  Neurological: Negative for dizziness, speech difficulty, light-headedness and headaches.  Psychiatric/Behavioral: Negative for agitation and sleep disturbance. The patient is not nervous/anxious.     Immunization History  Administered Date(s) Administered  . H1N1 03/27/2008, 03/27/2008  . Influenza, High Dose Seasonal PF 12/18/2014, 11/26/2016, 12/30/2017, 12/30/2017, 12/27/2018, 12/27/2018  . Influenza, Quadrivalent, Recombinant, Inj, Pf 12/29/2012  . Influenza,inj,quad, With Preservative 12/30/2017  . Influenza,trivalent, recombinat, inj, PF 12/14/2009  . Influenza-Unspecified 12/14/2009, 12/30/2017, 12/30/2017, 12/27/2018  . Moderna SARS-COVID-2 Vaccination 05/07/2019, 06/04/2019  . Pneumococcal Conjugate-13 04/20/2013  . Pneumococcal Polysaccharide-23 10/27/2006  . Tdap 02/08/2015, 02/08/2015, 02/17/2019   Pertinent  Health Maintenance Due  Topic Date Due  . OPHTHALMOLOGY EXAM  08/05/2019 (Originally 12/26/1941)  . INFLUENZA VACCINE  10/23/2019  . HEMOGLOBIN A1C  12/28/2019  . FOOT EXAM  05/19/2020  . PNA vac Low Risk Adult  Completed   Fall Risk  08/04/2019 07/01/2019 05/20/2019  Falls in the past year? 0 0 1  Number falls in past yr: 0 0 1  Injury with Fall? 0 0 1  Comment - - Nose had to have stitches    Vitals:   08/04/19 1425  Resp: 16  Temp: 97.8 F (36.6 C)  Weight: 244 lb 6.4 oz  (110.9 kg)  Height: 5\' 9"  (1.753 m)   Body mass index is 36.09 kg/m. Physical Exam Constitutional:      General: He is not in acute distress.    Appearance: He is not ill-appearing.  HENT:     Head: Normocephalic.     Nose: Congestion present. No rhinorrhea.     Mouth/Throat:     Mouth: Mucous membranes are moist.     Pharynx: Oropharynx is clear. No oropharyngeal exudate or posterior oropharyngeal erythema.  Eyes:     General: No scleral icterus.       Right eye: No discharge.        Left eye: No discharge.     Conjunctiva/sclera: Conjunctivae normal.     Pupils: Pupils are equal, round, and reactive to light.     Comments: Corrective lens in place   Neck:     Vascular: No carotid bruit.  Cardiovascular:     Rate and Rhythm: Normal rate and regular rhythm.     Pulses: Normal pulses.     Heart sounds: Normal heart sounds. No murmur. No friction rub. No gallop.   Pulmonary:     Effort: Pulmonary effort is normal. No respiratory distress.     Breath sounds: No wheezing or  rhonchi.     Comments: Bilateral rales clears up with cough.dimished lung bases.  Abdominal:     General: Bowel sounds are normal. There is no distension.     Palpations: Abdomen is soft. There is no mass.     Tenderness: There is no abdominal tenderness. There is no right CVA tenderness, left CVA tenderness, guarding or rebound.  Musculoskeletal:        General: No tenderness.     Cervical back: Normal range of motion. No rigidity or tenderness.     Comments: Unsteady gait walks with Front wheel walker.bilateral lower extremities non-pitting edema.   Lymphadenopathy:     Cervical: No cervical adenopathy.  Skin:    General: Skin is warm.     Coloration: Skin is not pale.     Findings: No bruising or rash.     Comments: Right leg chronic skin redness with small shallow ulcer wound bed red without any drainage.healing well per patient.   Neurological:     Mental Status: He is alert and oriented to person,  place, and time.     Cranial Nerves: No cranial nerve deficit.     Motor: No weakness.     Gait: Gait abnormal.  Psychiatric:        Mood and Affect: Mood normal.        Behavior: Behavior normal.        Thought Content: Thought content normal.        Judgment: Judgment normal.    Labs reviewed: Recent Labs    09/27/18 0000 06/28/19 1021  NA 136* 138  K 5.1 4.4  CL 100 100  CO2 31* 34*  GLUCOSE  --  190*  BUN 21 22  CREATININE 1.0 1.03  CALCIUM 10.1 9.8   Recent Labs    09/27/18 0000 06/28/19 1021  AST 17 14  ALT 16 16  BILITOT  --  0.5  PROT  --  6.6  ALBUMIN 4.3  --    Recent Labs    09/27/18 0000 06/28/19 1021  WBC 7.4 6.6  NEUTROABS  --  4,514  HGB 14.5 14.7  HCT 44 45.6  MCV  --  97.6  PLT 212 192   Lab Results  Component Value Date   TSH 1.61 06/28/2019   Lab Results  Component Value Date   HGBA1C 8.0 (H) 06/28/2019   Lab Results  Component Value Date   CHOL 230 (H) 06/28/2019   HDL 58 06/28/2019   LDLCALC 138 (H) 06/28/2019   TRIG 203 (H) 06/28/2019   CHOLHDL 4.0 06/28/2019    Significant Diagnostic Results in last 30 days:  No results found.  Assessment/Plan   Upper respiratory infection, acute Afebrile.worsening cough and nasal congestion.will treat with Doxycycline as below.side effects discussed advised to take with Probiotics to prevent antibiotics associated diarrhea.Verbalized understanding.Additional education information on doxycycline provided on AVS. - doxycycline (VIBRA-TABS) 100 MG tablet; Take 1 tablet (100 mg total) by mouth 2 (two) times daily.  Dispense: 20 tablet; Refill: 0 - saccharomyces boulardii (FLORASTOR) 250 MG capsule; Take 1 capsule (250 mg total) by mouth 2 (two) times daily for 10 days.  Dispense: 20 capsule; Refill: 0 - Advised to notify provider or go to ED if symptoms worsen or fail to improve.   Family/ staff Communication: Reviewed plan of care with patient verbalized understanding.   Labs/tests  ordered: None   Next Appointment: As needed if symptoms worsen or fail to improve.   Paije Goodhart C Minka Knight,  NP

## 2019-08-04 NOTE — Patient Instructions (Signed)
- Take Doxycycline 100 mg tablet one by mouth twice daily x 10 days  - Florastor 250 mg capsule one by mouth twice daily x 10 days  - Notify provider if symptoms worsen or fail to improve    Doxycycline delayed-release tablets What is this medicine? DOXYCYCLINE (dox i SYE kleen) is a tetracycline antibiotic. It kills certain bacteria or stops their growth. It is used to treat many kinds of infections, like dental, skin, respiratory, and urinary tract infections. It also treats acne, Lyme disease, malaria, and certain sexually transmitted infections. This medicine may be used for other purposes; ask your health care provider or pharmacist if you have questions. COMMON BRAND NAME(S): Doryx What should I tell my health care provider before I take this medicine? They need to know if you have any of these conditions:  bowel disease like colitis  liver disease  long exposure to sunlight like working outdoors  an unusual or allergic reaction to doxycycline, tetracycline antibiotics, other medicines, foods, dyes, or preservatives  pregnant or trying to get pregnant  breast-feeding How should I use this medicine? Take this medicine by mouth with a full glass of water. Follow the directions on the prescription label. Do not crush or chew. It is best to take this medicine without food, but if it upsets your stomach take it with food. Take your medicine at regular intervals. Do not take your medicine more often than directed. Take all of your medicine as directed even if you think your are better. Do not skip doses or stop your medicine early. Talk to your pediatrician regarding the use of this medicine in children. While this drug may be prescribed for selected conditions, precautions do apply. Overdosage: If you think you have taken too much of this medicine contact a poison control center or emergency room at once. NOTE: This medicine is only for you. Do not share this medicine with others. What  if I miss a dose? If you miss a dose, take it as soon as you can. If it is almost time for your next dose, take only that dose. Do not take double or extra doses. What may interact with this medicine?  antacids  barbiturates  birth control pills  bismuth subsalicylate  carbamazepine  methoxyflurane  other antibiotics  phenytoin  vitamins that contain iron  warfarin This list may not describe all possible interactions. Give your health care provider a list of all the medicines, herbs, non-prescription drugs, or dietary supplements you use. Also tell them if you smoke, drink alcohol, or use illegal drugs. Some items may interact with your medicine. What should I watch for while using this medicine? Tell your doctor or health care professional if your symptoms do not improve. Do not treat diarrhea with over the counter products. Contact your doctor if you have diarrhea that lasts more than 2 days or if it is severe and watery. Do not take this medicine just before going to bed. It may not dissolve properly when you lay down and can cause pain in your throat. Drink plenty of fluids while taking this medicine to also help reduce irritation in your throat. This medicine can make you more sensitive to the sun. Keep out of the sun. If you cannot avoid being in the sun, wear protective clothing and use sunscreen. Do not use sun lamps or tanning beds/booths. Birth control pills may not work properly while you are taking this medicine. Talk to your doctor about using an extra method of  birth control. If you are being treated for a sexually transmitted infection, avoid sexual contact until you have finished your treatment. Your sexual partner may also need treatment. Avoid antacids, aluminum, calcium, magnesium, and iron products for 4 hours before and 2 hours after taking a dose of this medicine. If you are using this medicine to prevent malaria, you should still protect yourself from contact  with mosquitos. Stay in screened-in areas, use mosquito nets, keep your body covered, and use an insect repellent. What side effects may I notice from receiving this medicine? Side effects that you should report to your doctor or health care professional as soon as possible:  allergic reactions like skin rash, itching or hives, swelling of the face, lips, or tongue  difficulty breathing  fever  itching in the rectal or genital area  pain on swallowing  rash, fever, and swollen lymph nodes  redness, blistering, peeling or loosening of the skin, including inside the mouth  severe stomach pain or cramps  unusual bleeding or bruising  unusually weak or tired  yellowing of the eyes or skin Side effects that usually do not require medical attention (report to your doctor or health care professional if they continue or are bothersome):  diarrhea  loss of appetite  nausea, vomiting This list may not describe all possible side effects. Call your doctor for medical advice about side effects. You may report side effects to FDA at 1-800-FDA-1088. Where should I keep my medicine? Keep out of the reach of children. Store at room temperature between 15 and 30 degrees C (59 and 86 degrees F). Protect from light. Keep container tightly closed. Throw away any unused medicine after the expiration date. Taking this medicine after the expiration date can make you seriously ill. NOTE: This sheet is a summary. It may not cover all possible information. If you have questions about this medicine, talk to your doctor, pharmacist, or health care provider.  2020 Elsevier/Gold Standard (2018-06-10 13:26:47)

## 2019-08-09 ENCOUNTER — Ambulatory Visit: Payer: Medicare PPO | Admitting: Physical Therapy

## 2019-08-10 ENCOUNTER — Encounter: Payer: Self-pay | Admitting: Internal Medicine

## 2019-08-11 ENCOUNTER — Ambulatory Visit: Payer: Medicare PPO | Admitting: Physical Therapy

## 2019-08-15 ENCOUNTER — Encounter: Payer: Self-pay | Admitting: Physical Therapy

## 2019-08-15 ENCOUNTER — Other Ambulatory Visit: Payer: Self-pay

## 2019-08-15 ENCOUNTER — Ambulatory Visit: Payer: Medicare PPO | Admitting: Physical Therapy

## 2019-08-15 DIAGNOSIS — R293 Abnormal posture: Secondary | ICD-10-CM | POA: Diagnosis not present

## 2019-08-15 DIAGNOSIS — R2689 Other abnormalities of gait and mobility: Secondary | ICD-10-CM

## 2019-08-15 DIAGNOSIS — M6281 Muscle weakness (generalized): Secondary | ICD-10-CM

## 2019-08-15 NOTE — Therapy (Signed)
Brian [name], [place], [address] Phone: [phone]   Fax:  [phone]  Physical Therapy Treatment  Patient Details  Name: Brian [name] MRN: [id number] Date of Birth: December 19, 1931 Referring Provider (PT): Hollace Kinnier, DO    Encounter Date: 08/15/2019  PT End of Session - 08/15/19 1317    Visit Number  3    Number of Visits  16    Date for PT Re-Evaluation  09/16/19    PT Start Time  [id number]    PT Stop Time  [id number]    PT Time Calculation (min)  43 min       Past Medical History:  Diagnosis Date  . Benign prostatic hyperplasia with urinary obstruction   . Bladder neck obstruction 02/02/2012  . Colon polyps 09/01/2011  . Depression   . Diabetes mellitus without complication ([place]) 99991111  . Dyslipidemia associated with type 2 diabetes mellitus ([place])   . Edema    edema in lower legs  . Hyperlipemia   . Hypertension 09/01/2011  . Kyphosis deformity of spine   . Lung disease   . Melanoma ([place])    on back    Past Surgical History:  Procedure Laterality Date  . HERNIA REPAIR     1960's  . MEDIAL PARTIAL KNEE REPLACEMENT  2015  . melanoma removed from back  2008  . sub mucus  2015    There were no vitals filed for this visit.  Subjective Assessment - 08/15/19 1316    Subjective  I had bronchitis the last 2 weeks. No pain now.    Currently in Pain?  No/denies                        [facility] Adult PT Treatment/Exercise - 08/15/19 0001      Ambulation/Gait   Ambulation Distance (Feet)  383 Feet    Assistive device  Rolling walker    Gait Comments  3 min 51 sec       Neuro Re-ed    Neuro Re-ed Details   staggered stance , narrow stance with head turns , alternating and unilateral step taps with 1UE assist       Lumbar Exercises: Standing   Other Standing Lumbar Exercises  Marching at RW- light touch       Shoulder Exercises: Supine   Horizontal ABduction Weight (lbs)  no  band x 10 in semireclined     Shoulder Flexion Weight (lbs)  wand x 10 overhead     Other Supine Exercises  retraction x 10       Shoulder Exercises: ROM/Strengthening   "W" Arms  20 reps seated     Other ROM/Strengthening Exercises  seated scap retract and W back x 10 each     Other ROM/Strengthening Exercises  sit-stands -min A without UE x 9       Shoulder Exercises: Stretch   Other Shoulder Stretches  LTR x 10 for 10 sec , opp head turn                PT Short Term Goals - 07/20/19 1632      PT SHORT TERM GOAL #1   Title  Patient demonstrates understanding of initial HEP for posture, balance    Time  4    Period  Weeks    Status  New    Target Date  08/17/19      PT SHORT TERM GOAL #2  Title  Patient verbalizes understanding of fall prevention strategies.     Time  4    Period  Weeks    Status  New    Target Date  08/17/19      PT SHORT TERM GOAL #3   Title  Pt will ambulate with LRAD and Mod I , 300 feet in community, no cues needed for safety.    Time  4    Period  Weeks    Status  New    Target Date  08/17/19      PT SHORT TERM GOAL #4   Title  Pt will submit ABC balance confidence scale, be screened for balance issues (DGI, Berg, TUG) and LTG set    Time  2    Period  Weeks    Status  New    Target Date  08/03/19        PT Long Term Goals - 07/20/19 1634      PT LONG TERM GOAL #1   Title  Patient demonstrates & verbalizes ongoing fitness plan / HEP    Time  8    Period  Weeks    Status  New    Target Date  09/16/19      PT LONG TERM GOAL #2   Title  Balance goals TBA based on current function.    Time  8    Period  Weeks    Status  New    Target Date  09/16/19      PT LONG TERM GOAL #3   Title  Patient ambulates with LRAD for balance & pain >800 feet in 6 minutes (6 min walk test) modified independent.    Baseline  2 min walk test 260 feet, min A at times and cues for safety    Time  8    Period  Weeks    Status  New    Target Date   09/16/19      PT LONG TERM GOAL #4   Title  Pt will demonstrate corrected posture with min tension/pain in upper back    Time  8    Period  Weeks    Status  New    Target Date  09/16/19      PT LONG TERM GOAL #5   Title  Pt will make progress towards gait safety and independence with cane in his home, requiring less cues for objects and potential hazards.    Time  8    Period  Weeks    Status  New    Target Date  09/16/19            Plan - 08/15/19 1352    Clinical Impression Statement  Pt has been absent for 2 weeks due to bronchitis. Gait in clnic with RW and increasing SOB and antalgic gait pattern by end of a  lap around clinic. Cues to decrease UE support on RW however he was still heavily dependent. Continued with balance and trunk ROM. He reports his upper back pain has not bothered him as much lately.    PT Next Visit Plan  check HEP, Balance screen.  Collect ABC score (Berg 42/56, need TUG and DGI), set goals?  eventual Nustep    PT Home Exercise Plan  corner stretch, supine flexion and horiz abd (yellow) , Lower turnk flexion       Patient will benefit from skilled therapeutic intervention in order to improve the following deficits and impairments:  Abnormal gait, Decreased knowledge of use of DME, Increased fascial restricitons, Impaired sensation, Improper body mechanics, Pain, Postural dysfunction, Decreased mobility, Cardiopulmonary status limiting activity, Decreased activity tolerance, Decreased range of motion, Decreased strength, Hypomobility, Impaired UE functional use, Impaired flexibility, Difficulty walking, Decreased balance, Decreased safety awareness  Visit Diagnosis: Abnormal posture  Muscle weakness (generalized)  Other abnormalities of gait and mobility     Problem List Patient Active Problem List   Diagnosis Date Noted  . Carpal tunnel syndrome 06/17/2019  . Benign head tremor 06/17/2019  . Pain of joint of left ankle and foot 01/27/2018   . Infection of olecranon bursa 10/03/2015  . Olecranon bursitis, left elbow 09/26/2015  . Benign prostatic hyperplasia with urinary obstruction 02/02/2015  . Diabetes mellitus without complication ([place]) [id number]  . Bladder neck obstruction 02/02/2012  . Essential hypertension 09/01/2011  . Kyphosis 09/01/2011  . Neuropathy 09/01/2011  . Osteoarthritis 09/01/2011  . Colon polyps 09/01/2011  . S/P right unicompartmental knee replacement 09/01/2011    [name], PTA 08/15/2019, 2:09 PM  [facility] [address] [place], [place], [address] Phone: [phone]   Fax:  [phone]  Name: TAMAURI FOSBERG MRN: [id number] Date of Birth: 03/07/1932

## 2019-08-19 ENCOUNTER — Other Ambulatory Visit: Payer: Self-pay

## 2019-08-19 ENCOUNTER — Other Ambulatory Visit: Payer: Self-pay | Admitting: Internal Medicine

## 2019-08-19 ENCOUNTER — Other Ambulatory Visit (HOSPITAL_COMMUNITY): Payer: Self-pay | Admitting: Internal Medicine

## 2019-08-19 ENCOUNTER — Ambulatory Visit (HOSPITAL_COMMUNITY): Payer: Medicare PPO | Attending: Cardiology

## 2019-08-19 DIAGNOSIS — I10 Essential (primary) hypertension: Secondary | ICD-10-CM

## 2019-08-19 DIAGNOSIS — I872 Venous insufficiency (chronic) (peripheral): Secondary | ICD-10-CM

## 2019-08-19 DIAGNOSIS — R0609 Other forms of dyspnea: Secondary | ICD-10-CM

## 2019-08-19 DIAGNOSIS — R0600 Dyspnea, unspecified: Secondary | ICD-10-CM | POA: Diagnosis present

## 2019-08-23 ENCOUNTER — Other Ambulatory Visit: Payer: Self-pay

## 2019-08-23 ENCOUNTER — Ambulatory Visit: Payer: Medicare PPO | Attending: Internal Medicine | Admitting: Physical Therapy

## 2019-08-23 DIAGNOSIS — R293 Abnormal posture: Secondary | ICD-10-CM | POA: Insufficient documentation

## 2019-08-23 DIAGNOSIS — R2689 Other abnormalities of gait and mobility: Secondary | ICD-10-CM | POA: Diagnosis present

## 2019-08-23 DIAGNOSIS — M6281 Muscle weakness (generalized): Secondary | ICD-10-CM

## 2019-08-23 NOTE — Therapy (Signed)
[place], [place], [address] Phone: [phone]   Fax:  [phone]  Physical Therapy Treatment  Patient Details  Name: Brian [name] MRN: [id number] Date of Birth: 07-04-1931 Referring Provider (PT): Hollace Kinnier, DO    Encounter Date: 08/23/2019  PT End of Session - 08/23/19 1324    Visit Number  4    Number of Visits  16    Date for PT Re-Evaluation  09/16/19    PT Start Time  [id number]    PT Stop Time  1357    PT Time Calculation (min)  42 min       Past Medical History:  Diagnosis Date  . Benign prostatic hyperplasia with urinary obstruction   . Bladder neck obstruction 02/02/2012  . Colon polyps 09/01/2011  . Depression   . Diabetes mellitus without complication ([place]) 99991111  . Dyslipidemia associated with type 2 diabetes mellitus ([place])   . Edema    edema in lower legs  . Hyperlipemia   . Hypertension 09/01/2011  . Kyphosis deformity of spine   . Lung disease   . Melanoma ([place])    on back    Past Surgical History:  Procedure Laterality Date  . HERNIA REPAIR     1960's  . MEDIAL PARTIAL KNEE REPLACEMENT  2015  . melanoma removed from back  2008  . sub mucus  2015    There were no vitals filed for this visit.                     [facility] Adult PT Treatment/Exercise - 08/23/19 0001      Ambulation/Gait   Gait Comments  614ft in 6 minutes/ 2 MWT 245      Neuro Re-ed    Neuro Re-ed Details   staggered stance , narrow stance with head turns , alternating and unilateral step taps with 1UE assist , marching with 1 UE suppor t      Lumbar Exercises: Standing   Other Standing Lumbar Exercises  Marching at RW- light touch     Other Standing Lumbar Exercises  standing hip abduction 10 x 2 each with RW       Shoulder Exercises: Supine   Horizontal ABduction Weight (lbs)  no band x 10 in semireclined     Shoulder Flexion Weight (lbs)  wand x 10 overhead     Other Supine  Exercises  retraction x 10     Other Supine Exercises  supine SLR x 10 each       Shoulder Exercises: ROM/Strengthening   "W" Arms  20 reps seated     Other ROM/Strengthening Exercises  seated scap retract and W back x 10 each     Other ROM/Strengthening Exercises  sit to stand 6 without UE, then x 4 with UE to rise       Shoulder Exercises: Stretch   Other Shoulder Stretches  LTR x 10 for 10 sec , opp head turn                PT Short Term Goals - 07/20/19 1632      PT SHORT TERM GOAL #1   Title  Patient demonstrates understanding of initial HEP for posture, balance    Time  4    Period  Weeks    Status  New    Target Date  08/17/19      PT SHORT TERM GOAL #2   Title  Patient verbalizes understanding of fall prevention strategies.     Time  4    Period  Weeks    Status  New    Target Date  08/17/19      PT SHORT TERM GOAL #3   Title  Pt will ambulate with LRAD and Mod I , 300 feet in community, no cues needed for safety.    Time  4    Period  Weeks    Status  New    Target Date  08/17/19      PT SHORT TERM GOAL #4   Title  Pt will submit ABC balance confidence scale, be screened for balance issues (DGI, Berg, TUG) and LTG set    Time  2    Period  Weeks    Status  New    Target Date  08/03/19        PT Long Term Goals - 07/20/19 1634      PT LONG TERM GOAL #1   Title  Patient demonstrates & verbalizes ongoing fitness plan / HEP    Time  8    Period  Weeks    Status  New    Target Date  09/16/19      PT LONG TERM GOAL #2   Title  Balance goals TBA based on current function.    Time  8    Period  Weeks    Status  New    Target Date  09/16/19      PT LONG TERM GOAL #3   Title  Patient ambulates with LRAD for balance & pain >800 feet in 6 minutes (6 min walk test) modified independent.    Baseline  2 min walk test 260 feet, min A at times and cues for safety    Time  8    Period  Weeks    Status  New    Target Date  09/16/19      PT LONG TERM  GOAL #4   Title  Pt will demonstrate corrected posture with min tension/pain in upper back    Time  8    Period  Weeks    Status  New    Target Date  09/16/19      PT LONG TERM GOAL #5   Title  Pt will make progress towards gait safety and independence with cane in his home, requiring less cues for objects and potential hazards.    Time  8    Period  Weeks    Status  New    Target Date  09/16/19            Plan - 08/23/19 1404    Clinical Impression Statement  Pt arrives without pain. Continued gait endurance in clinic and exercises for balance. Rest breaks required. Decreased endurance for sit-stands.    PT Next Visit Plan  check HEP, Balance screen.  Collect ABC score (Berg 42/56, need TUG and DGI), set goals?  eventual Nustep, try parallel bars for gait and balance    PT Home Exercise Plan  corner stretch, supine flexion and horiz abd (yellow) , Lower turnk flexion       Patient will benefit from skilled therapeutic intervention in order to improve the following deficits and impairments:  Abnormal gait, Decreased knowledge of use of DME, Increased fascial restricitons, Impaired sensation, Improper body mechanics, Pain, Postural dysfunction, Decreased mobility, Cardiopulmonary status limiting activity, Decreased activity tolerance, Decreased range of motion, Decreased strength, Hypomobility, Impaired UE  functional use, Impaired flexibility, Difficulty walking, Decreased balance, Decreased safety awareness  Visit Diagnosis: Abnormal posture  Muscle weakness (generalized)  Other abnormalities of gait and mobility     Problem List Patient Active Problem List   Diagnosis Date Noted  . Carpal tunnel syndrome 06/17/2019  . Benign head tremor 06/17/2019  . Pain of joint of left ankle and foot 01/27/2018  . Infection of olecranon bursa 10/03/2015  . Olecranon bursitis, left elbow 09/26/2015  . Benign prostatic hyperplasia with urinary obstruction 02/02/2015  . Diabetes  mellitus without complication ([place]) [id number]  . Bladder neck obstruction 02/02/2012  . Essential hypertension 09/01/2011  . Kyphosis 09/01/2011  . Neuropathy 09/01/2011  . Osteoarthritis 09/01/2011  . Colon polyps 09/01/2011  . S/P right unicompartmental knee replacement 09/01/2011    [name], PTA 08/23/2019, 2:08 PM  [facility] [address] [place], [place], [address] Phone: [phone]   Fax:  [phone]  Name: KRIMSON OCHSNER MRN: [id number] Date of Birth: 12-Jan-1932

## 2019-08-29 NOTE — Progress Notes (Signed)
His echocardiogram shows mild impairment with the heart relaxing.  Seems that his restrictive lung disease is the biggest issue due to his kyphoscoliosis and his obesity.  We can talk about this at his visit

## 2019-08-30 ENCOUNTER — Other Ambulatory Visit: Payer: Self-pay

## 2019-08-30 ENCOUNTER — Ambulatory Visit: Payer: Medicare PPO | Admitting: Physical Therapy

## 2019-08-30 DIAGNOSIS — R293 Abnormal posture: Secondary | ICD-10-CM | POA: Diagnosis not present

## 2019-08-30 DIAGNOSIS — M6281 Muscle weakness (generalized): Secondary | ICD-10-CM

## 2019-08-30 DIAGNOSIS — R2689 Other abnormalities of gait and mobility: Secondary | ICD-10-CM

## 2019-08-30 NOTE — Therapy (Signed)
[place], [place], [address] Phone: [phone]   Fax:  [phone]  Physical Therapy Treatment  Patient Details  Name: Brian [name] MRN: [id number] Date of Birth: [birth year] Referring Provider (PT): Hollace Kinnier, DO    Encounter Date: 08/30/2019  PT End of Session - 08/30/19 1357    Visit Number  5    Number of Visits  16    Date for PT Re-Evaluation  09/16/19    PT Start Time  1400    PT Stop Time  6073    PT Time Calculation (min)  45 min       Past Medical History:  Diagnosis Date  . Benign prostatic hyperplasia with urinary obstruction   . Bladder neck obstruction 02/02/2012  . Colon polyps 09/01/2011  . Depression   . Diabetes mellitus without complication ([place]) 71/08/2692  . Dyslipidemia associated with type 2 diabetes mellitus ([place])   . Edema    edema in lower legs  . Hyperlipemia   . Hypertension 09/01/2011  . Kyphosis deformity of spine   . Lung disease   . Melanoma ([place])    on back    Past Surgical History:  Procedure Laterality Date  . HERNIA REPAIR     1960's  . MEDIAL PARTIAL KNEE REPLACEMENT  2015  . melanoma removed from back  2008  . sub mucus  2015    There were no vitals filed for this visit.  Subjective Assessment - 08/30/19 1406    Subjective  These wraps have been stinging I have to wear them all but 1 hour a day.  Back hurting today 3/10. .    Currently in Pain?  Yes    Pain Score  3     Pain Location  Back    Pain Orientation  Mid;Upper    Pain Descriptors / Indicators  Aching;Tightness    Pain Type  Chronic pain    Pain Onset  More than a month ago    Pain Frequency  Intermittent    Aggravating Factors   activity    Pain Relieving Factors  resting forward or in the recliner, on his side    Multiple Pain Sites  No         [facility] Adult PT Treatment/Exercise - 08/30/19 0001      High Level Balance   High Level Balance Activities  Side  stepping;Backward walking;Tandem walking;Marching forwards;Marching backwards   tandem static 2 x each side with finger tip hold    High Level Balance Comments  in parallel bars multiple cues for posture       Lumbar Exercises: Aerobic   Nustep  5 min L3 UE and LE       Lumbar Exercises: Standing   Scapular Retraction Limitations  x 10 with glute squeeze     Row  Strengthening;Both;20 reps    Theraband Level (Row)  Level 3 (Green)    Other Standing Lumbar Exercises  standing diagonal D2 flexion with green band x 15 with 1 UE assist       Lumbar Exercises: Seated   Sit to Stand  10 reps    Sit to Stand Limitations  parallel bars              PT Education - 08/30/19 1713    Education Details  goals, HEP, posture , balance    Person(s) Educated  Patient    Methods  Explanation;Demonstration    Comprehension  Verbalized understanding;Returned demonstration;Need further instruction       PT Short Term Goals - 08/30/19 1440      PT SHORT TERM GOAL #1   Title  Patient demonstrates understanding of initial HEP for posture, balance    Baseline  has done some but he got sick and lost his momentum    Status  On-going      PT SHORT TERM GOAL #2   Title  Patient verbalizes understanding of fall prevention strategies.     Status  On-going      PT SHORT TERM GOAL #3   Title  Pt will ambulate with LRAD and Mod I , 300 feet in community, no cues needed for safety.    Status  On-going      PT SHORT TERM GOAL #4   Title  Pt will submit ABC balance confidence scale, be screened for balance issues (DGI, Berg, TUG) and LTG set    Status  On-going        PT Long Term Goals - 08/30/19 1441      PT LONG TERM GOAL #1   Title  Patient demonstrates & verbalizes ongoing fitness plan / HEP      PT LONG TERM GOAL #2   Title  Balance goals TBA based on current function.      PT LONG TERM GOAL #3   Title  Patient ambulates with LRAD for balance & pain >800 feet in 6 minutes (6 min walk  test) modified independent.      PT LONG TERM GOAL #4   Title  Pt will demonstrate corrected posture with min tension/pain in upper back    Baseline  can do today    Status  On-going            Plan - 08/30/19 1409    Clinical Impression Statement  Min pain today in mid.upper back.  Worked on balance today, needs cues for posture and safety.  He works hard during session, poor carryover of cues.    PT Treatment/Interventions  ADLs/Self Care Home Management;Therapeutic activities;Patient/family education;Taping;Therapeutic exercise;DME Instruction;Moist Heat;Cryotherapy;Functional mobility training;Manual techniques;Passive range of motion;Neuromuscular re-education;Balance training;Gait training;Stair training    PT Next Visit Plan  check HEP,  Collect ABC score (Berg 42/56, need TUG and DGI), set goals? try parallel bars for gait and balance    PT Home Exercise Plan  corner stretch, supine flexion and horiz abd (yellow) , Lower turnk flexion    Consulted and Agree with Plan of Care  Patient       Patient will benefit from skilled therapeutic intervention in order to improve the following deficits and impairments:  Abnormal gait, Decreased knowledge of use of DME, Increased fascial restricitons, Impaired sensation, Improper body mechanics, Pain, Postural dysfunction, Decreased mobility, Cardiopulmonary status limiting activity, Decreased activity tolerance, Decreased range of motion, Decreased strength, Hypomobility, Impaired UE functional use, Impaired flexibility, Difficulty walking, Decreased balance, Decreased safety awareness  Visit Diagnosis: Abnormal posture  Muscle weakness (generalized)  Other abnormalities of gait and mobility     Problem List Patient Active Problem List   Diagnosis Date Noted  . Carpal tunnel syndrome 06/17/2019  . Benign head tremor 06/17/2019  . Pain of joint of left ankle and foot 01/27/2018  . Infection of olecranon bursa 10/03/2015  .  Olecranon bursitis, left elbow 09/26/2015  . Benign prostatic hyperplasia with urinary obstruction 02/02/2015  . Diabetes mellitus without complication ([place]) 95/63/8756  . Bladder neck obstruction 02/02/2012  . Essential hypertension  09/01/2011  . Kyphosis 09/01/2011  . Neuropathy 09/01/2011  . Osteoarthritis 09/01/2011  . Colon polyps 09/01/2011  . S/P right unicompartmental knee replacement 09/01/2011    Gustava Berland 08/30/2019, 5:18 PM  [place], [place], [address] Phone: [phone]   Fax:  [phone]  Name: Brian [name] MRN: [id number] Date of Birth: 1931-06-30  Raeford Razor, PT 08/30/19 5:18 PM Phone: [phone] Fax: [phone]

## 2019-09-01 ENCOUNTER — Ambulatory Visit: Payer: Medicare PPO | Admitting: Physical Therapy

## 2019-09-01 ENCOUNTER — Other Ambulatory Visit: Payer: Self-pay

## 2019-09-01 ENCOUNTER — Encounter: Payer: Self-pay | Admitting: Physical Therapy

## 2019-09-01 DIAGNOSIS — R293 Abnormal posture: Secondary | ICD-10-CM

## 2019-09-01 DIAGNOSIS — M6281 Muscle weakness (generalized): Secondary | ICD-10-CM

## 2019-09-01 DIAGNOSIS — R2689 Other abnormalities of gait and mobility: Secondary | ICD-10-CM

## 2019-09-01 NOTE — Therapy (Signed)
[place], [place], [address] Phone: [phone]   Fax:  [phone]  Physical Therapy Treatment  Patient Details  Name: Brian [name] MRN: [id number] Date of Birth: 1931/08/03 Referring Provider (PT): Hollace Kinnier, DO    Encounter Date: 09/01/2019   PT End of Session - 09/01/19 1544    Visit Number 6    Number of Visits 16    Date for PT Re-Evaluation 09/16/19    Authorization Type [employer]    PT Start Time 8315    PT Stop Time 1620    PT Time Calculation (min) 45 min    Activity Tolerance Patient tolerated treatment well;Patient limited by fatigue    Behavior During Therapy [facility] for tasks assessed/performed           Past Medical History:  Diagnosis Date  . Benign prostatic hyperplasia with urinary obstruction   . Bladder neck obstruction 02/02/2012  . Colon polyps 09/01/2011  . Depression   . Diabetes mellitus without complication ([place]) 17/61/6073  . Dyslipidemia associated with type 2 diabetes mellitus ([place])   . Edema    edema in lower legs  . Hyperlipemia   . Hypertension 09/01/2011  . Kyphosis deformity of spine   . Lung disease   . Melanoma ([place])    on back    Past Surgical History:  Procedure Laterality Date  . HERNIA REPAIR     1960's  . MEDIAL PARTIAL KNEE REPLACEMENT  2015  . melanoma removed from back  2008  . sub mucus  2015    There were no vitals filed for this visit.   Subjective Assessment - 09/01/19 1543    Subjective Was at the MD befroe this.  Lymph massage helped my legs.  No back pain currently    Currently in Pain? No/denies             [facility] Adult PT Treatment/Exercise - 09/01/19 0001      Lumbar Exercises: Stretches   Active Hamstring Stretch 3 reps;30 seconds    Active Hamstring Stretch Limitations seated     Lower Trunk Rotation 5 reps;20 seconds    Standing Extension Limitations shoulder flexion x 10 cane       Lumbar Exercises: Seated   Other  Seated Lumbar Exercises goal post x10 seated, scapular retraction x 10    Other Seated Lumbar Exercises shoulder flexion cane x 10 sitting and then standing       Shoulder Exercises: Supine   Horizontal ABduction Strengthening;Both;15 reps;Theraband    Theraband Level (Shoulder Horizontal ABduction) Level 3 (Green)      Shoulder Exercises: Stretch   Corner Stretch 5 reps;30 seconds                  PT Education - 09/01/19 1633    Education Details POC, HEP, do HEP daily with CNA when she is there for safety    Person(s) Educated Patient    Methods Explanation    Comprehension Verbalized understanding;Need further instruction;Returned demonstration            PT Short Term Goals - 08/30/19 1440      PT SHORT TERM GOAL #1   Title Patient demonstrates understanding of initial HEP for posture, balance    Baseline has done some but he got sick and lost his momentum    Status On-going      PT SHORT TERM GOAL #2   Title Patient verbalizes understanding of fall prevention  strategies.     Status On-going      PT SHORT TERM GOAL #3   Title Pt will ambulate with LRAD and Mod I , 300 feet in community, no cues needed for safety.    Status On-going      PT SHORT TERM GOAL #4   Title Pt will submit ABC balance confidence scale, be screened for balance issues (DGI, Berg, TUG) and LTG set    Status On-going             PT Long Term Goals - 09/01/19 1637      PT LONG TERM GOAL #1   Title Patient demonstrates & verbalizes ongoing fitness plan / HEP    Status On-going      PT LONG TERM GOAL #2   Title Berg score will improve to 45/54 or more showing decrease in fall risk    Baseline 42/56    Status On-going      PT LONG TERM GOAL #3   Title Patient ambulates with LRAD for balance & pain >800 feet in 6 minutes (6 min walk test) modified independent.    Status Unable to assess      PT LONG TERM GOAL #4   Title Pt will demonstrate corrected posture with min  tension/pain in upper back    Status Achieved      PT LONG TERM GOAL #5   Title Pt will make progress towards gait safety and independence with cane in his home, requiring less cues for objects and potential hazards.    Status On-going                 Plan - 09/01/19 1633    Clinical Impression Statement Patient was able to perform HEP with min cues.  He did have 1 LOB in corner while streching, posteriorly. Continues to benefit from PT for balance training, postural awareness and mobility exercises to maintain independence.    Examination-Activity Limitations Carry;Dressing;Stairs;Squat;Sit;Bathing;Lift;Stand;Locomotion Level;Bed Mobility;Reach Overhead    PT Treatment/Interventions ADLs/Self Care Home Management;Therapeutic activities;Patient/family education;Taping;Therapeutic exercise;DME Instruction;Moist Heat;Cryotherapy;Functional mobility training;Manual techniques;Passive range of motion;Neuromuscular re-education;Balance training;Gait training;Stair training    PT Next Visit Plan try parallel bars for gait and balance. Walking, endurance . Use wall slides with hip ext/shoulder flexion for posterior chain    PT Home Exercise Plan corner stretch, supine flexion and horiz abd (yellow/red) , Lower turnk flexion, hamstring stretch    Consulted and Agree with Plan of Care Patient           Patient will benefit from skilled therapeutic intervention in order to improve the following deficits and impairments:  Abnormal gait, Decreased knowledge of use of DME, Increased fascial restricitons, Impaired sensation, Improper body mechanics, Pain, Postural dysfunction, Decreased mobility, Cardiopulmonary status limiting activity, Decreased activity tolerance, Decreased range of motion, Decreased strength, Hypomobility, Impaired UE functional use, Impaired flexibility, Difficulty walking, Decreased balance, Decreased safety awareness  Visit Diagnosis: Muscle weakness (generalized)  Other  abnormalities of gait and mobility  Abnormal posture     Problem List Patient Active Problem List   Diagnosis Date Noted  . Carpal tunnel syndrome 06/17/2019  . Benign head tremor 06/17/2019  . Pain of joint of left ankle and foot 01/27/2018  . Infection of olecranon bursa 10/03/2015  . Olecranon bursitis, left elbow 09/26/2015  . Benign prostatic hyperplasia with urinary obstruction 02/02/2015  . Diabetes mellitus without complication ([place]) 76/72/0947  . Bladder neck obstruction 02/02/2012  . Essential hypertension 09/01/2011  . Kyphosis 09/01/2011  .  Neuropathy 09/01/2011  . Osteoarthritis 09/01/2011  . Colon polyps 09/01/2011  . S/P right unicompartmental knee replacement 09/01/2011    Granite Godman 09/01/2019, 4:39 PM  [place], [place], [address] Phone: [phone]   Fax:  [phone]  Name: Brian [name] MRN: [id number] Date of Birth: 23-Dec-1931  Raeford Razor, PT 09/01/19 4:40 PM Phone: [phone] Fax: [phone]

## 2019-09-06 ENCOUNTER — Other Ambulatory Visit: Payer: Self-pay

## 2019-09-06 ENCOUNTER — Encounter: Payer: Self-pay | Admitting: Physical Therapy

## 2019-09-06 ENCOUNTER — Ambulatory Visit: Payer: Medicare PPO | Admitting: Physical Therapy

## 2019-09-06 DIAGNOSIS — R293 Abnormal posture: Secondary | ICD-10-CM

## 2019-09-06 DIAGNOSIS — M6281 Muscle weakness (generalized): Secondary | ICD-10-CM

## 2019-09-06 DIAGNOSIS — R2689 Other abnormalities of gait and mobility: Secondary | ICD-10-CM

## 2019-09-06 NOTE — Therapy (Signed)
Brian [name], [place], [address] Phone: [phone]   Fax:  [phone]  Physical Therapy Treatment  Patient Details  Name: Brian [name] MRN: [id number] Date of Birth: [birth year] Referring Provider (PT): Hollace Kinnier, DO    Encounter Date: 09/06/2019   PT End of Session - 09/06/19 1329    Visit Number 7    Number of Visits 16    Date for PT Re-Evaluation 09/16/19    Authorization Type [employer]    PT Start Time 1325    PT Stop Time 7169    PT Time Calculation (min) 34 min           Past Medical History:  Diagnosis Date  . Benign prostatic hyperplasia with urinary obstruction   . Bladder neck obstruction 02/02/2012  . Colon polyps 09/01/2011  . Depression   . Diabetes mellitus without complication ([place]) 67/89/3810  . Dyslipidemia associated with type 2 diabetes mellitus ([place])   . Edema    edema in lower legs  . Hyperlipemia   . Hypertension 09/01/2011  . Kyphosis deformity of spine   . Lung disease   . Melanoma ([place])    on back    Past Surgical History:  Procedure Laterality Date  . HERNIA REPAIR     1960's  . MEDIAL PARTIAL KNEE REPLACEMENT  2015  . melanoma removed from back  2008  . sub mucus  2015    There were no vitals filed for this visit.                      [facility] Adult PT Treatment/Exercise - 09/06/19 0001      High Level Balance   High Level Balance Activities Side stepping;Backward walking;Turns    High Level Balance Comments in parallel bars multiple cues for posture       Lumbar Exercises: Standing   Row Strengthening;Both;20 reps    Theraband Level (Row) Level 4 (Blue)    Other Standing Lumbar Exercises 360 turns x 2 left , x 2 right     Other Standing Lumbar Exercises standing hip abduction 10 x 2 each in parallel bars, hip flexion 10 x2       Shoulder Exercises: ROM/Strengthening   Other ROM/Strengthening Exercises Sit- stand x 10 without UE from  mat table                      PT Short Term Goals - 08/30/19 1440      PT SHORT TERM GOAL #1   Title Patient demonstrates understanding of initial HEP for posture, balance    Baseline has done some but he got sick and lost his momentum    Status On-going      PT SHORT TERM GOAL #2   Title Patient verbalizes understanding of fall prevention strategies.     Status On-going      PT SHORT TERM GOAL #3   Title Pt will ambulate with LRAD and Mod I , 300 feet in community, no cues needed for safety.    Status On-going      PT SHORT TERM GOAL #4   Title Pt will submit ABC balance confidence scale, be screened for balance issues (DGI, Berg, TUG) and LTG set    Status On-going             PT Long Term Goals - 09/01/19 1637      PT LONG TERM GOAL #1  Title Patient demonstrates & verbalizes ongoing fitness plan / HEP    Status On-going      PT LONG TERM GOAL #2   Title Berg score will improve to 45/54 or more showing decrease in fall risk    Baseline 42/56    Status On-going      PT LONG TERM GOAL #3   Title Patient ambulates with LRAD for balance & pain >800 feet in 6 minutes (6 min walk test) modified independent.    Status Unable to assess      PT LONG TERM GOAL #4   Title Pt will demonstrate corrected posture with min tension/pain in upper back    Status Achieved      PT LONG TERM GOAL #5   Title Pt will make progress towards gait safety and independence with cane in his home, requiring less cues for objects and potential hazards.    Status On-going                 Plan - 09/06/19 1356    Clinical Impression Statement pt arrives late today. Time spent with balance and gait in parallel bars. Frequent rest breaks required.    PT Next Visit Plan try parallel bars for gait and balance. Walking, endurance . Use wall slides with hip ext/shoulder flexion for posterior chain    PT Home Exercise Plan corner stretch, supine flexion and horiz abd (yellow/red) ,  Lower turnk flexion, hamstring stretch           Patient will benefit from skilled therapeutic intervention in order to improve the following deficits and impairments:  Abnormal gait, Decreased knowledge of use of DME, Increased fascial restricitons, Impaired sensation, Improper body mechanics, Pain, Postural dysfunction, Decreased mobility, Cardiopulmonary status limiting activity, Decreased activity tolerance, Decreased range of motion, Decreased strength, Hypomobility, Impaired UE functional use, Impaired flexibility, Difficulty walking, Decreased balance, Decreased safety awareness  Visit Diagnosis: Muscle weakness (generalized)  Other abnormalities of gait and mobility  Abnormal posture     Problem List Patient Active Problem List   Diagnosis Date Noted  . Carpal tunnel syndrome 06/17/2019  . Benign head tremor 06/17/2019  . Pain of joint of left ankle and foot 01/27/2018  . Infection of olecranon bursa 10/03/2015  . Olecranon bursitis, left elbow 09/26/2015  . Benign prostatic hyperplasia with urinary obstruction 02/02/2015  . Diabetes mellitus without complication ([place]) 98/92/1194  . Bladder neck obstruction 02/02/2012  . Essential hypertension 09/01/2011  . Kyphosis 09/01/2011  . Neuropathy 09/01/2011  . Osteoarthritis 09/01/2011  . Colon polyps 09/01/2011  . S/P right unicompartmental knee replacement 09/01/2011    [name], PTA 09/06/2019, 1:57 PM  [name], [place], [address] Phone: [phone]   Fax:  [phone]  Name: Brian [name] MRN: [id number] Date of Birth: 1932-02-16

## 2019-09-07 LAB — HM DIABETES EYE EXAM

## 2019-09-08 ENCOUNTER — Encounter: Payer: Self-pay | Admitting: Physical Therapy

## 2019-09-08 ENCOUNTER — Other Ambulatory Visit: Payer: Self-pay

## 2019-09-08 ENCOUNTER — Ambulatory Visit: Payer: Medicare PPO | Admitting: Physical Therapy

## 2019-09-08 DIAGNOSIS — R293 Abnormal posture: Secondary | ICD-10-CM

## 2019-09-08 DIAGNOSIS — M6281 Muscle weakness (generalized): Secondary | ICD-10-CM

## 2019-09-08 DIAGNOSIS — R2689 Other abnormalities of gait and mobility: Secondary | ICD-10-CM

## 2019-09-08 NOTE — Patient Instructions (Signed)
Low Row: Standing    Face anchor, feet shoulder width apart. Palms up, pull arms back, squeezing shoulder blades together. Repeat _10-20_ times per set. Do _2_ sets per session. Do _3-5_ sessions per week. Anchor Height: Waist  http://tub.exer.us/65   Copyright  VHI. All rights reserved.  SHOULDER: Extension (Band)    Start with arm slightly forward. Holding band, pull backward, past hip, keeping elbow straight. Do not swing arm. Hold _3__ seconds. Use __Green or BLue ______ band. ___10-20 reps per set, ___ sets per day, ___3-5 days per week  Copyright  VHI. All rights reserved.    EXTENSION: Standing - Resistance Band: Stable (Active)    Stand, right arm at side. Against yellow resistance band, draw arm backward, as far as possible, keeping elbow straight. Complete __2_ sets of __10-20_ repetitions. Perform _3-5__ sessions per week   Copyright  VHI. All rights reserved.

## 2019-09-08 NOTE — Therapy (Signed)
[place], [place], [address] Phone: [phone]   Fax:  [phone]  Physical Therapy Treatment  Patient Details  Name: Brian [name] MRN: [id number] Date of Birth: [birth year] Referring Provider (PT): Hollace Kinnier, DO    Encounter Date: 09/08/2019   PT End of Session - 09/08/19 1508    Visit Number 8    Number of Visits 16    Date for PT Re-Evaluation 09/16/19    Authorization Type [employer]    PT Start Time 5956    PT Stop Time 1530    PT Time Calculation (min) 43 min    Equipment Utilized During Treatment Gait belt    Activity Tolerance Patient tolerated treatment well;Patient limited by fatigue    Behavior During Therapy [facility] for tasks assessed/performed           Past Medical History:  Diagnosis Date  . Benign prostatic hyperplasia with urinary obstruction   . Bladder neck obstruction 02/02/2012  . Colon polyps 09/01/2011  . Depression   . Diabetes mellitus without complication ([place]) 38/75/6433  . Dyslipidemia associated with type 2 diabetes mellitus ([place])   . Edema    edema in lower legs  . Hyperlipemia   . Hypertension 09/01/2011  . Kyphosis deformity of spine   . Lung disease   . Melanoma ([place])    on back    Past Surgical History:  Procedure Laterality Date  . HERNIA REPAIR     1960's  . MEDIAL PARTIAL KNEE REPLACEMENT  2015  . melanoma removed from back  2008  . sub mucus  2015    There were no vitals filed for this visit.   Subjective Assessment - 09/08/19 1507    Subjective No new complaints. Min back pain lower R side this AM.    Currently in Pain? Yes    Pain Location Back    Pain Orientation Lower;Right    Pain Descriptors / Indicators Aching    Pain Type Chronic pain    Pain Onset More than a month ago    Pain Frequency Intermittent    Aggravating Factors  standing    Pain Relieving Factors resting               [facility] Adult PT Treatment/Exercise - 09/08/19  0001      Ambulation/Gait   Ambulation Distance (Feet) 650 Feet    Assistive device Rolling walker    Gait Pattern Step-through pattern;Decreased step length - right;Decreased step length - left;Decreased stride length;Trunk flexed;Poor foot clearance - left;Poor foot clearance - right    Ambulation Surface Level;Indoor    Pre-Gait Activities 7:45 , 3 laps     Gait Comments increased stride length with cues and for posture       Lumbar Exercises: Aerobic   UBE (Upper Arm Bike) reverse L 1 for 5 min       Lumbar Exercises: Standing   Functional Squats 10 reps    Functional Squats Limitations at walker     Lifting Weights (lbs) 4 lbs on dowel x 10 and chest press in standing     Row Strengthening;Both;20 reps    Theraband Level (Row) Level 3 (Green)    Shoulder Extension Strengthening;20 reps    Theraband Level (Shoulder Extension) Level 3 (Green)      Lumbar Exercises: Seated   Other Seated Lumbar Exercises knee ext 5 lbs x 20 each LE  PT Education - 09/08/19 1755    Education Details posture, bands for HEP , stride length and cues for gait    Person(s) Educated Patient    Methods Explanation;Demonstration;Handout    Comprehension Verbalized understanding;Returned demonstration            PT Short Term Goals - 08/30/19 1440      PT SHORT TERM GOAL #1   Title Patient demonstrates understanding of initial HEP for posture, balance    Baseline has done some but he got sick and lost his momentum    Status On-going      PT SHORT TERM GOAL #2   Title Patient verbalizes understanding of fall prevention strategies.     Status On-going      PT SHORT TERM GOAL #3   Title Pt will ambulate with LRAD and Mod I , 300 feet in community, no cues needed for safety.    Status On-going      PT SHORT TERM GOAL #4   Title Pt will submit ABC balance confidence scale, be screened for balance issues (DGI, Berg, TUG) and LTG set    Status On-going              PT Long Term Goals - 09/01/19 1637      PT LONG TERM GOAL #1   Title Patient demonstrates & verbalizes ongoing fitness plan / HEP    Status On-going      PT LONG TERM GOAL #2   Title Berg score will improve to 45/54 or more showing decrease in fall risk    Baseline 42/56    Status On-going      PT LONG TERM GOAL #3   Title Patient ambulates with LRAD for balance & pain >800 feet in 6 minutes (6 min walk test) modified independent.    Status Unable to assess      PT LONG TERM GOAL #4   Title Pt will demonstrate corrected posture with min tension/pain in upper back    Status Achieved      PT LONG TERM GOAL #5   Title Pt will make progress towards gait safety and independence with cane in his home, requiring less cues for objects and potential hazards.    Status On-going                 Plan - 09/08/19 1756    Clinical Impression Statement Pt demonstrated good activity tolerance today walking for almost 8 min and 650 feet.  Improved hip extension with cues.  Working towards goals but still needs walker and supervision to  Min A for standing work    Examination-Activity Limitations Carry;Dressing;Stairs;Squat;Sit;Bathing;Lift;Stand;Locomotion Level;Bed Mobility;Reach Overhead    PT Treatment/Interventions ADLs/Self Care Home Management;Therapeutic activities;Patient/family education;Taping;Therapeutic exercise;DME Instruction;Moist Heat;Cryotherapy;Functional mobility training;Manual techniques;Passive range of motion;Neuromuscular re-education;Balance training;Gait training;Stair training    PT Next Visit Plan cont with standing as tolerated, gait    PT Home Exercise Plan corner stretch, supine flexion and horiz abd (yellow/red) , Lower turnk flexion, hamstring stretch. row and ext in stand with green band    Consulted and Agree with Plan of Care Patient           Patient will benefit from skilled therapeutic intervention in order to improve the following deficits  and impairments:  Abnormal gait, Decreased knowledge of use of DME, Increased fascial restricitons, Impaired sensation, Improper body mechanics, Pain, Postural dysfunction, Decreased mobility, Cardiopulmonary status limiting activity, Decreased activity tolerance, Decreased range of motion, Decreased strength, Hypomobility,  Impaired UE functional use, Impaired flexibility, Difficulty walking, Decreased balance, Decreased safety awareness  Visit Diagnosis: Muscle weakness (generalized)  Other abnormalities of gait and mobility  Abnormal posture     Problem List Patient Active Problem List   Diagnosis Date Noted  . Carpal tunnel syndrome 06/17/2019  . Benign head tremor 06/17/2019  . Pain of joint of left ankle and foot 01/27/2018  . Infection of olecranon bursa 10/03/2015  . Olecranon bursitis, left elbow 09/26/2015  . Benign prostatic hyperplasia with urinary obstruction 02/02/2015  . Diabetes mellitus without complication ([place]) 45/62/5638  . Bladder neck obstruction 02/02/2012  . Essential hypertension 09/01/2011  . Kyphosis 09/01/2011  . Neuropathy 09/01/2011  . Osteoarthritis 09/01/2011  . Colon polyps 09/01/2011  . S/P right unicompartmental knee replacement 09/01/2011    Tomica Arseneault 09/08/2019, 6:05 PM  [place], [place], [address] Phone: [phone]   Fax:  [phone]  Name: HORACE LUKAS MRN: [id number] Date of Birth: [birth year]  Raeford Razor, PT 09/08/19 6:06 PM Phone: [phone] Fax: [phone]

## 2019-09-13 ENCOUNTER — Ambulatory Visit: Payer: Medicare PPO | Admitting: Physical Therapy

## 2019-09-13 ENCOUNTER — Other Ambulatory Visit: Payer: Self-pay

## 2019-09-13 ENCOUNTER — Encounter: Payer: Self-pay | Admitting: Physical Therapy

## 2019-09-13 DIAGNOSIS — R293 Abnormal posture: Secondary | ICD-10-CM | POA: Diagnosis not present

## 2019-09-13 DIAGNOSIS — M6281 Muscle weakness (generalized): Secondary | ICD-10-CM

## 2019-09-13 DIAGNOSIS — R2689 Other abnormalities of gait and mobility: Secondary | ICD-10-CM

## 2019-09-13 NOTE — Therapy (Signed)
[place], [place], [address] Phone: [phone]   Fax:  [phone]  Physical Therapy Treatment  Patient Details  Name: Brian [name] MRN: [id number] Date of Birth: Oct 29, 1931 Referring Provider (PT): Hollace Kinnier, DO    Encounter Date: 09/13/2019   PT End of Session - 09/13/19 1523    Visit Number 9    Number of Visits 16    Date for PT Re-Evaluation 09/16/19    Authorization Type [employer]    PT Start Time 9735    PT Stop Time 1532    PT Time Calculation (min) 45 min    Equipment Utilized During Treatment Gait belt    Activity Tolerance Patient tolerated treatment well;Patient limited by fatigue    Behavior During Therapy [facility] for tasks assessed/performed           Past Medical History:  Diagnosis Date  . Benign prostatic hyperplasia with urinary obstruction   . Bladder neck obstruction 02/02/2012  . Colon polyps 09/01/2011  . Depression   . Diabetes mellitus without complication ([place]) 32/99/2426  . Dyslipidemia associated with type 2 diabetes mellitus ([place])   . Edema    edema in lower legs  . Hyperlipemia   . Hypertension 09/01/2011  . Kyphosis deformity of spine   . Lung disease   . Melanoma ([place])    on back    Past Surgical History:  Procedure Laterality Date  . HERNIA REPAIR     1960's  . MEDIAL PARTIAL KNEE REPLACEMENT  2015  . melanoma removed from back  2008  . sub mucus  2015    There were no vitals filed for this visit.   Subjective Assessment - 09/13/19 1451    Subjective Walked last night and his back and legs were sore.             [facility] Adult PT Treatment/Exercise - 09/13/19 0001      Ambulation/Gait   Ambulation Distance (Feet) 80 Feet    Assistive device Other (Comment)    Gait Pattern Step-through pattern    Pre-Gait Activities mod VCs    Gait Comments walking poles, min A       Lumbar Exercises: Standing   Row Limitations standing in parallel bars red  x 15 bilateral , then flexion, horiztonal abduction, contact guard Assist     Other Standing Lumbar Exercises diagonal pull red band with trunk rotation x 15 each side       Knee/Hip Exercises: Standing   Hip Flexion Limitations high marching x 15    Extension Limitations kick backs in hinge at parallel bars x 10 each                   PT Education - 09/13/19 1852    Education Details Gait with walking sticks    Person(s) Educated Patient    Methods Explanation;Verbal cues    Comprehension Verbalized understanding;Verbal cues required            PT Short Term Goals - 08/30/19 1440      PT SHORT TERM GOAL #1   Title Patient demonstrates understanding of initial HEP for posture, balance    Baseline has done some but he got sick and lost his momentum    Status On-going      PT SHORT TERM GOAL #2   Title Patient verbalizes understanding of fall prevention strategies.     Status On-going      PT SHORT  TERM GOAL #3   Title Pt will ambulate with LRAD and Mod I , 300 feet in community, no cues needed for safety.    Status On-going      PT SHORT TERM GOAL #4   Title Pt will submit ABC balance confidence scale, be screened for balance issues (DGI, Berg, TUG) and LTG set    Status On-going             PT Long Term Goals - 09/01/19 1637      PT LONG TERM GOAL #1   Title Patient demonstrates & verbalizes ongoing fitness plan / HEP    Status On-going      PT LONG TERM GOAL #2   Title Berg score will improve to 45/54 or more showing decrease in fall risk    Baseline 42/56    Status On-going      PT LONG TERM GOAL #3   Title Patient ambulates with LRAD for balance & pain >800 feet in 6 minutes (6 min walk test) modified independent.    Status Unable to assess      PT LONG TERM GOAL #4   Title Pt will demonstrate corrected posture with min tension/pain in upper back    Status Achieved      PT LONG TERM GOAL #5   Title Pt will make progress towards gait safety and  independence with cane in his home, requiring less cues for objects and potential hazards.    Status On-going                 Plan - 09/13/19 1859    Clinical Impression Statement Pts goal is to walk without the walker, tried to walk with walking poles and improved trunk extension but min assist for leaning Rt and coordination of pattern. Worked on posterior chain strength in standing and reported no pain increase.    PT Treatment/Interventions ADLs/Self Care Home Management;Therapeutic activities;Patient/family education;Taping;Therapeutic exercise;DME Instruction;Moist Heat;Cryotherapy;Functional mobility training;Manual techniques;Passive range of motion;Neuromuscular re-education;Balance training;Gait training;Stair training    PT Next Visit Plan renew? POC ending 6/25 cont with standing as tolerated, gait    PT Home Exercise Plan corner stretch, supine flexion and horiz abd (yellow/red) , Lower turnk flexion, hamstring stretch. row and ext in stand with green band    Consulted and Agree with Plan of Care Patient           Patient will benefit from skilled therapeutic intervention in order to improve the following deficits and impairments:  Abnormal gait, Decreased knowledge of use of DME, Increased fascial restricitons, Impaired sensation, Improper body mechanics, Pain, Postural dysfunction, Decreased mobility, Cardiopulmonary status limiting activity, Decreased activity tolerance, Decreased range of motion, Decreased strength, Hypomobility, Impaired UE functional use, Impaired flexibility, Difficulty walking, Decreased balance, Decreased safety awareness  Visit Diagnosis: Muscle weakness (generalized)  Abnormal posture  Other abnormalities of gait and mobility     Problem List Patient Active Problem List   Diagnosis Date Noted  . Carpal tunnel syndrome 06/17/2019  . Benign head tremor 06/17/2019  . Pain of joint of left ankle and foot 01/27/2018  . Infection of  olecranon bursa 10/03/2015  . Olecranon bursitis, left elbow 09/26/2015  . Benign prostatic hyperplasia with urinary obstruction 02/02/2015  . Diabetes mellitus without complication ([place]) 85/46/2703  . Bladder neck obstruction 02/02/2012  . Essential hypertension 09/01/2011  . Kyphosis 09/01/2011  . Neuropathy 09/01/2011  . Osteoarthritis 09/01/2011  . Colon polyps 09/01/2011  . S/P right unicompartmental knee replacement 09/01/2011  Riata Ikeda 09/13/2019, 8:19 PM  [facility] [address] [place], [place], [address] Phone: [phone]   Fax:  [phone]  Name: CLETO CLAGGETT MRN: [id number] Date of Birth: July 07, 1931  Raeford Razor, PT 09/13/19 8:19 PM Phone: [phone] Fax: [phone]

## 2019-09-15 ENCOUNTER — Encounter: Payer: Self-pay | Admitting: Internal Medicine

## 2019-09-16 ENCOUNTER — Ambulatory Visit: Payer: Medicare PPO | Admitting: Physical Therapy

## 2019-09-16 ENCOUNTER — Other Ambulatory Visit: Payer: Self-pay

## 2019-09-16 DIAGNOSIS — R293 Abnormal posture: Secondary | ICD-10-CM | POA: Diagnosis not present

## 2019-09-16 DIAGNOSIS — R2689 Other abnormalities of gait and mobility: Secondary | ICD-10-CM

## 2019-09-16 DIAGNOSIS — M6281 Muscle weakness (generalized): Secondary | ICD-10-CM

## 2019-09-16 NOTE — Therapy (Signed)
Brian [name], [place], [address] Phone: [phone]   Fax:  [phone]  Physical Therapy Treatment  Patient Details  Name: Brian [name] MRN: [id number] Date of Birth: [birth year] Referring Provider (PT): Hollace Kinnier, DO    Encounter Date: 09/16/2019   PT End of Session - 09/16/19 1319    Visit Number 10    Number of Visits 25    Date for PT Re-Evaluation 11/11/19    Authorization Type [employer]    PT Start Time 1230    PT Stop Time 1312    PT Time Calculation (min) 42 min           Past Medical History:  Diagnosis Date  . Benign prostatic hyperplasia with urinary obstruction   . Bladder neck obstruction 02/02/2012  . Colon polyps 09/01/2011  . Depression   . Diabetes mellitus without complication ([place]) 29/52/8413  . Dyslipidemia associated with type 2 diabetes mellitus ([place])   . Edema    edema in lower legs  . Hyperlipemia   . Hypertension 09/01/2011  . Kyphosis deformity of spine   . Lung disease   . Melanoma ([place])    on back    Past Surgical History:  Procedure Laterality Date  . HERNIA REPAIR     1960's  . MEDIAL PARTIAL KNEE REPLACEMENT  2015  . melanoma removed from back  2008  . sub mucus  2015    There were no vitals filed for this visit.   Subjective Assessment - 09/16/19 1317    Subjective No compliants.    Currently in Pain? No/denies                             [facility] Adult PT Treatment/Exercise - 09/16/19 0001      Neuro Re-ed    Neuro Re-ed Details  staggered stance , narrow stance with head turns , alternating and unilateral step taps with 1UE assist , marching with 1 UE support, foam pad  narrow stance - alternating step taps from foam to 6 inch step-      Lumbar Exercises: Standing   Row Limitations green band rows, extension and horizontal abduction - standing in parallel bars- GCA     Other Standing Lumbar Exercises standing hip abduction 10 x  2 each in parallel bars, hip flexion 10 x2       Knee/Hip Exercises: Standing   Gait Training in parallel bars: forward gait -cues for erect posture- multiple bouts with and without SPC- cues for increased step length , side stepping without UE                    PT Short Term Goals - 08/30/19 1440      PT SHORT TERM GOAL #1   Title Patient demonstrates understanding of initial HEP for posture, balance    Baseline has done some but he got sick and lost his momentum    Status On-going      PT SHORT TERM GOAL #2   Title Patient verbalizes understanding of fall prevention strategies.     Status On-going      PT SHORT TERM GOAL #3   Title Pt will ambulate with LRAD and Mod I , 300 feet in community, no cues needed for safety.    Status On-going      PT SHORT TERM GOAL #4   Title Pt will submit ABC balance  confidence scale, be screened for balance issues (DGI, Berg, TUG) and LTG set    Status On-going             PT Long Term Goals - 09/01/19 1637      PT LONG TERM GOAL #1   Title Patient demonstrates & verbalizes ongoing fitness plan / HEP    Status On-going      PT LONG TERM GOAL #2   Title Berg score will improve to 45/54 or more showing decrease in fall risk    Baseline 42/56    Status On-going      PT LONG TERM GOAL #3   Title Patient ambulates with LRAD for balance & pain >800 feet in 6 minutes (6 min walk test) modified independent.    Status Unable to assess      PT LONG TERM GOAL #4   Title Pt will demonstrate corrected posture with min tension/pain in upper back    Status Achieved      PT LONG TERM GOAL #5   Title Pt will make progress towards gait safety and independence with cane in his home, requiring less cues for objects and potential hazards.    Status On-going                 Plan - 09/16/19 1328    Clinical Impression Statement Gait training focus today in parallel bars without AD and upright posture. Needs cues to increase step  length.  Trial of SPC which was more difficult to maintain upright posture.    Examination-Participation Restrictions Church;Community Activity;Interpersonal Relationship    Stability/Clinical Decision Making Evolving/Moderate complexity    PT Frequency 2x / week    PT Duration 8 weeks    PT Treatment/Interventions ADLs/Self Care Home Management;Therapeutic activities;Patient/family education;Taping;Therapeutic exercise;DME Instruction;Moist Heat;Cryotherapy;Functional mobility training;Manual techniques;Passive range of motion;Neuromuscular re-education;Balance training;Gait training;Stair training    PT Next Visit Plan gait with and without AD-gait belt- thoracic strength, balance    PT Home Exercise Plan corner stretch, supine flexion and horiz abd (yellow/red) , Lower turnk flexion, hamstring stretch. row and ext in stand with green band           Patient will benefit from skilled therapeutic intervention in order to improve the following deficits and impairments:  Abnormal gait, Decreased knowledge of use of DME, Increased fascial restricitons, Impaired sensation, Improper body mechanics, Pain, Postural dysfunction, Decreased mobility, Cardiopulmonary status limiting activity, Decreased activity tolerance, Decreased range of motion, Decreased strength, Hypomobility, Impaired UE functional use, Impaired flexibility, Difficulty walking, Decreased balance, Decreased safety awareness  Visit Diagnosis: Muscle weakness (generalized)  Abnormal posture  Other abnormalities of gait and mobility     Problem List Patient Active Problem List   Diagnosis Date Noted  . Carpal tunnel syndrome 06/17/2019  . Benign head tremor 06/17/2019  . Pain of joint of left ankle and foot 01/27/2018  . Infection of olecranon bursa 10/03/2015  . Olecranon bursitis, left elbow 09/26/2015  . Benign prostatic hyperplasia with urinary obstruction 02/02/2015  . Diabetes mellitus without complication ([place])  72/53/6644  . Bladder neck obstruction 02/02/2012  . Essential hypertension 09/01/2011  . Kyphosis 09/01/2011  . Neuropathy 09/01/2011  . Osteoarthritis 09/01/2011  . Colon polyps 09/01/2011  . S/P right unicompartmental knee replacement 09/01/2011    [name], PTA 09/16/2019, 1:36 PM  [facility] [address] [place], [place], [address] Phone: [phone]   Fax:  [phone]  Name: Brian [name] MRN: [id number]  Date of Birth: 10-May-1931

## 2019-09-16 NOTE — Addendum Note (Signed)
Addended by: Raeford Razor L on: 09/16/2019 09:23 AM   Modules accepted: Orders

## 2019-09-21 ENCOUNTER — Other Ambulatory Visit: Payer: Self-pay

## 2019-09-21 ENCOUNTER — Ambulatory Visit: Payer: Medicare PPO | Admitting: Physical Therapy

## 2019-09-21 DIAGNOSIS — M6281 Muscle weakness (generalized): Secondary | ICD-10-CM

## 2019-09-21 DIAGNOSIS — R293 Abnormal posture: Secondary | ICD-10-CM

## 2019-09-21 DIAGNOSIS — R2689 Other abnormalities of gait and mobility: Secondary | ICD-10-CM

## 2019-09-21 NOTE — Therapy (Signed)
[facility] [place], [place], [address] Phone: [phone]   Fax:  [phone]  Physical Therapy Treatment  Patient Details  Name: Brian [name] MRN: [id number] Date of Birth: 07-Aug-1931 Referring Provider (PT): Hollace Kinnier, DO    Encounter Date: 09/21/2019   PT End of Session - 09/21/19 1254    Visit Number 11    Number of Visits 25    Date for PT Re-Evaluation 11/11/19    Authorization Type [employer]    PT Start Time 1230    PT Stop Time 1315    PT Time Calculation (min) 45 min           Past Medical History:  Diagnosis Date  . Benign prostatic hyperplasia with urinary obstruction   . Bladder neck obstruction 02/02/2012  . Colon polyps 09/01/2011  . Depression   . Diabetes mellitus without complication ([place]) 17/79/3903  . Dyslipidemia associated with type 2 diabetes mellitus ([place])   . Edema    edema in lower legs  . Hyperlipemia   . Hypertension 09/01/2011  . Kyphosis deformity of spine   . Lung disease   . Melanoma ([place])    on back    Past Surgical History:  Procedure Laterality Date  . HERNIA REPAIR     1960's  . MEDIAL PARTIAL KNEE REPLACEMENT  2015  . melanoma removed from back  2008  . sub mucus  2015    There were no vitals filed for this visit.   Subjective Assessment - 09/21/19 1257    Subjective I did not sleep well last night. I was tired after last session.    Currently in Pain? Yes    Pain Score 2     Pain Location Back    Pain Orientation Right;Left;Lower    Pain Descriptors / Indicators Aching    Pain Type Chronic pain    Aggravating Factors  over doing it    Pain Relieving Factors rest                             [facility] Adult PT Treatment/Exercise - 09/21/19 0001      Neuro Re-ed    Neuro Re-ed Details  narrow stance, normal stance on faom with head turns, marching on foam bilat uE and single UE,       Lumbar Exercises: Standing   Functional  Squats 15 reps    Row Limitations green band horizontal abduction 10 x 2       Knee/Hip Exercises: Standing   Hip Flexion Limitations high marching x 20 each     Extension Limitations kick backs in hinge at parallel bars x 10 each     Gait Training forward gait and side stepping wihthout UE     Other Standing Knee Exercises hip abduction 20 reps each                     PT Short Term Goals - 08/30/19 1440      PT SHORT TERM GOAL #1   Title Patient demonstrates understanding of initial HEP for posture, balance    Baseline has done some but he got sick and lost his momentum    Status On-going      PT SHORT TERM GOAL #2   Title Patient verbalizes understanding of fall prevention strategies.     Status On-going      PT SHORT TERM GOAL #3  Title Pt will ambulate with LRAD and Mod I , 300 feet in community, no cues needed for safety.    Status On-going      PT SHORT TERM GOAL #4   Title Pt will submit ABC balance confidence scale, be screened for balance issues (DGI, Berg, TUG) and LTG set    Status On-going             PT Long Term Goals - 09/01/19 1637      PT LONG TERM GOAL #1   Title Patient demonstrates & verbalizes ongoing fitness plan / HEP    Status On-going      PT LONG TERM GOAL #2   Title Berg score will improve to 45/54 or more showing decrease in fall risk    Baseline 42/56    Status On-going      PT LONG TERM GOAL #3   Title Patient ambulates with LRAD for balance & pain >800 feet in 6 minutes (6 min walk test) modified independent.    Status Unable to assess      PT LONG TERM GOAL #4   Title Pt will demonstrate corrected posture with min tension/pain in upper back    Status Achieved      PT LONG TERM GOAL #5   Title Pt will make progress towards gait safety and independence with cane in his home, requiring less cues for objects and potential hazards.    Status On-going                 Plan - 09/21/19 1230    Clinical Impression  Statement Pt is hoping to have compression wraps and shoes removed tomorrow. Continued with gait without AD and LE strengthening in parallel bars. Needs cues to keep uright posture wth RW.    PT Next Visit Plan gait with and without AD-gait belt- thoracic strength, balance    PT Home Exercise Plan corner stretch, supine flexion and horiz abd (yellow/red) , Lower turnk flexion, hamstring stretch. row and ext in stand with green band           Patient will benefit from skilled therapeutic intervention in order to improve the following deficits and impairments:  Abnormal gait, Decreased knowledge of use of DME, Increased fascial restricitons, Impaired sensation, Improper body mechanics, Pain, Postural dysfunction, Decreased mobility, Cardiopulmonary status limiting activity, Decreased activity tolerance, Decreased range of motion, Decreased strength, Hypomobility, Impaired UE functional use, Impaired flexibility, Difficulty walking, Decreased balance, Decreased safety awareness  Visit Diagnosis: Muscle weakness (generalized)  Abnormal posture  Other abnormalities of gait and mobility     Problem List Patient Active Problem List   Diagnosis Date Noted  . Carpal tunnel syndrome 06/17/2019  . Benign head tremor 06/17/2019  . Pain of joint of left ankle and foot 01/27/2018  . Infection of olecranon bursa 10/03/2015  . Olecranon bursitis, left elbow 09/26/2015  . Benign prostatic hyperplasia with urinary obstruction 02/02/2015  . Diabetes mellitus without complication ([place]) 41/93/7902  . Bladder neck obstruction 02/02/2012  . Essential hypertension 09/01/2011  . Kyphosis 09/01/2011  . Neuropathy 09/01/2011  . Osteoarthritis 09/01/2011  . Colon polyps 09/01/2011  . S/P right unicompartmental knee replacement 09/01/2011    [name], PTA 09/21/2019, 1:22 PM  [facility] [address] [place], [place], [address] Phone:  [phone]   Fax:  [phone]  Name: Brian [name] MRN: [id number] Date of Birth: 07-07-1931

## 2019-09-27 ENCOUNTER — Ambulatory Visit: Payer: Medicare PPO | Attending: Internal Medicine | Admitting: Physical Therapy

## 2019-09-27 ENCOUNTER — Encounter: Payer: Self-pay | Admitting: Physical Therapy

## 2019-09-27 ENCOUNTER — Other Ambulatory Visit: Payer: Self-pay

## 2019-09-27 DIAGNOSIS — M6281 Muscle weakness (generalized): Secondary | ICD-10-CM | POA: Diagnosis not present

## 2019-09-27 DIAGNOSIS — R2689 Other abnormalities of gait and mobility: Secondary | ICD-10-CM | POA: Diagnosis present

## 2019-09-27 DIAGNOSIS — R293 Abnormal posture: Secondary | ICD-10-CM

## 2019-09-27 NOTE — Therapy (Addendum)
[place], [place], [address] Phone: [phone]   Fax:  [phone]  Physical Therapy Treatment/Discharge  Patient Details  Name: Brian [name] MRN: [id number] Date of Birth: [birth year] Referring Provider (PT): Hollace Kinnier, DO    Encounter Date: 09/27/2019   PT End of Session - 09/27/19 1403    Visit Number 12    Number of Visits 25    Date for PT Re-Evaluation 11/11/19    Authorization Type [employer]    PT Start Time 1400    PT Stop Time 1450    PT Time Calculation (min) 50 min    Equipment Utilized During Treatment Gait belt    Activity Tolerance Patient tolerated treatment well;Patient limited by fatigue    Behavior During Therapy [facility] for tasks assessed/performed           Past Medical History:  Diagnosis Date   Benign prostatic hyperplasia with urinary obstruction    Bladder neck obstruction 02/02/2012   Colon polyps 09/01/2011   Depression    Diabetes mellitus without complication ([place]) 69/48/5462   Dyslipidemia associated with type 2 diabetes mellitus ([place])    Edema    edema in lower legs   Hyperlipemia    Hypertension 09/01/2011   Kyphosis deformity of spine    Lung disease    Melanoma ([place])    on back    Past Surgical History:  Procedure Laterality Date   HERNIA REPAIR     1960's   MEDIAL PARTIAL KNEE REPLACEMENT  2015   melanoma removed from back  2008   sub mucus  2015    There were no vitals filed for this visit.   Subjective Assessment - 09/27/19 1405    Subjective I need to make more appts.  Feeling more steady when he walks in the house.    Currently in Pain? No/denies              [facility] PT Assessment - 09/27/19 0001      Transfers   Five time sit to stand comments  28.9 sec uses hands to RW    20.4 sec with Airex in chair , light UE assist              [facility] Adult PT Treatment/Exercise - 09/27/19 0001      Lumbar Exercises: Stretches     Lower Trunk Rotation 10 seconds    Lower Trunk Rotation Limitations x 10       Lumbar Exercises: Standing   Functional Squats 15 reps    Functional Squats Limitations fatigued     Wall Slides Limitations bilateral shoulder flexion x 10 alternating , then hip extension alternating for post chain       Lumbar Exercises: Seated   Sit to Stand 10 reps    Sit to Stand Limitations 2x 5       Shoulder Exercises: Supine   Flexion AROM;Strengthening;Both;15 reps      Shoulder Exercises: Standing   Horizontal ABduction Strengthening;Both;15 reps;Theraband    Theraband Level (Shoulder Horizontal ABduction) Level 3 (Green)    Diagonals Strengthening;Both;15 reps    Theraband Level (Shoulder Diagonals) Level 3 (Green)      Shoulder Exercises: Stretch   Corner Stretch Limitations verbal review                     PT Short Term Goals - 08/30/19 1440      PT SHORT TERM GOAL #1  Title Patient demonstrates understanding of initial HEP for posture, balance    Baseline has done some but he got sick and lost his momentum    Status On-going      PT SHORT TERM GOAL #2   Title Patient verbalizes understanding of fall prevention strategies.     Status On-going      PT SHORT TERM GOAL #3   Title Pt will ambulate with LRAD and Mod I , 300 feet in community, no cues needed for safety.    Status On-going      PT SHORT TERM GOAL #4   Title Pt will submit ABC balance confidence scale, be screened for balance issues (DGI, Berg, TUG) and LTG set    Status On-going             PT Long Term Goals - 09/01/19 1637      PT LONG TERM GOAL #1   Title Patient demonstrates & verbalizes ongoing fitness plan / HEP    Status On-going      PT LONG TERM GOAL #2   Title Berg score will improve to 45/54 or more showing decrease in fall risk    Baseline 42/56    Status On-going      PT LONG TERM GOAL #3   Title Patient ambulates with LRAD for balance & pain >800 feet in 6 minutes (6 min  walk test) modified independent.    Status Unable to assess      PT LONG TERM GOAL #4   Title Pt will demonstrate corrected posture with min tension/pain in upper back    Status Achieved      PT LONG TERM GOAL #5   Title Pt will make progress towards gait safety and independence with cane in his home, requiring less cues for objects and potential hazards.    Status On-going                 Plan - 09/27/19 1436    Clinical Impression Statement Pt had compression bandages changed, legs feeling good.  Mild back pain . Squat and sit to stands increased pain and fatigue.  Pt was diaphoretic. Sa O2 to 92%. Spent last part of session strethcing and reinforcing daily routine to maximize outcomes.  He is benefitting from PT to maximize his independence.  Understands he may never be able to go without his walker due to balance and fall risk. Cues needed during session for trunk extension.    Examination-Activity Limitations Carry;Dressing;Stairs;Squat;Sit;Bathing;Lift;Stand;Locomotion Level;Bed Mobility;Reach Overhead    PT Treatment/Interventions ADLs/Self Care Home Management;Therapeutic activities;Patient/family education;Taping;Therapeutic exercise;DME Instruction;Moist Heat;Cryotherapy;Functional mobility training;Manual techniques;Passive range of motion;Neuromuscular re-education;Balance training;Gait training;Stair training    PT Next Visit Plan gait with and without AD-gait belt- thoracic strength, balance    PT Home Exercise Plan corner stretch, supine flexion and horiz abd (yellow/red) , Lower turnk flexion, hamstring stretch. row and ext in stand with green band    Consulted and Agree with Plan of Care Patient           Patient will benefit from skilled therapeutic intervention in order to improve the following deficits and impairments:  Abnormal gait, Decreased knowledge of use of DME, Increased fascial restricitons, Impaired sensation, Improper body mechanics, Pain, Postural  dysfunction, Decreased mobility, Cardiopulmonary status limiting activity, Decreased activity tolerance, Decreased range of motion, Decreased strength, Hypomobility, Impaired UE functional use, Impaired flexibility, Difficulty walking, Decreased balance, Decreased safety awareness  Visit Diagnosis: Muscle weakness (generalized)  Abnormal posture  Other abnormalities  of gait and mobility     Problem List Patient Active Problem List   Diagnosis Date Noted   Carpal tunnel syndrome 06/17/2019   Benign head tremor 06/17/2019   Pain of joint of left ankle and foot 01/27/2018   Infection of olecranon bursa 10/03/2015   Olecranon bursitis, left elbow 09/26/2015   Benign prostatic hyperplasia with urinary obstruction 02/02/2015   Diabetes mellitus without complication ([place]) 79/48/0165   Bladder neck obstruction 02/02/2012   Essential hypertension 09/01/2011   Kyphosis 09/01/2011   Neuropathy 09/01/2011   Osteoarthritis 09/01/2011   Colon polyps 09/01/2011   S/P right unicompartmental knee replacement 09/01/2011    Jerrie Gullo 09/27/2019, 3:57 PM  [facility] [address] [place], [place], [address] Phone: [phone]   Fax:  [phone]  Name: Brian [name] MRN: [id number] Date of Birth: 05-03-1931  Raeford Razor, PT 09/27/19 3:57 PM Phone: [phone] Fax: [phone]   PHYSICAL THERAPY DISCHARGE SUMMARY  Visits from Start of Care: 12  Current functional level related to goals / functional outcomes: See above    Remaining deficits: Balance , posture, gait, fall risk, HEP  Education / Equipment: See above  Plan: Patient agrees to discharge.  Patient goals were partially met. Patient is being discharged due to not returning since the last visit.  ?????    Raeford Razor, PT 12/02/19 10:40 AM Phone: [phone] Fax: [phone]

## 2019-09-27 NOTE — Patient Instructions (Signed)
Flexibility: Corner Stretch    Standing in corner with hands just above shoulder level and feet ___12_ inches from corner, lean forward until a comfortable stretch is felt across chest. Hold __30__ seconds. Repeat __3__ times per set. Do __1__ sets per session. Do ___2_ sessions per day.  http://orth.exer.us/342   Copyright  VHI. All rights reserved.  Lumbar Rotation (Non-Weight Bearing)    Feet on floor, slowly rock knees from side to side in small, pain-free range of motion. Allow lower back to rotate slightly. Repeat _10___ times per set. Do ___1_ sets per session. Do __2__ sessions per day.  http://orth.exer.us/160   Copyright  VHI. All rights reserved.  Cane Exercise: Flexion    Lie on back, holding cane above chest. Keeping arms as straight as possible, lower cane toward floor beyond head. Hold __10__ seconds. Repeat __10__ times. Do ____2 sessions per day.  http://gt2.exer.us/91   Copyright  VHI. All rights reserved.

## 2019-10-27 ENCOUNTER — Other Ambulatory Visit: Payer: Medicare PPO

## 2019-10-31 ENCOUNTER — Ambulatory Visit: Payer: Medicare PPO | Admitting: Internal Medicine

## 2019-11-03 ENCOUNTER — Other Ambulatory Visit: Payer: Medicare PPO

## 2019-11-04 LAB — CBC WITH DIFFERENTIAL/PLATELET
Absolute Monocytes: 690 cells/uL (ref 200–950)
Basophils Absolute: 42 cells/uL (ref 0–200)
Basophils Relative: 0.7 %
Eosinophils Absolute: 42 cells/uL (ref 15–500)
Eosinophils Relative: 0.7 %
HCT: 47.8 % (ref 38.5–50.0)
Hemoglobin: 15.2 g/dL (ref 13.2–17.1)
Lymphs Abs: 1488 cells/uL (ref 850–3900)
MCH: 31.4 pg (ref 27.0–33.0)
MCHC: 31.8 g/dL — ABNORMAL LOW (ref 32.0–36.0)
MCV: 98.8 fL (ref 80.0–100.0)
MPV: 10.8 fL (ref 7.5–12.5)
Monocytes Relative: 11.5 %
Neutro Abs: 3738 cells/uL (ref 1500–7800)
Neutrophils Relative %: 62.3 %
Platelets: 209 10*3/uL (ref 140–400)
RBC: 4.84 10*6/uL (ref 4.20–5.80)
RDW: 12.7 % (ref 11.0–15.0)
Total Lymphocyte: 24.8 %
WBC: 6 10*3/uL (ref 3.8–10.8)

## 2019-11-07 ENCOUNTER — Encounter: Payer: Self-pay | Admitting: Internal Medicine

## 2019-11-07 DIAGNOSIS — E559 Vitamin D deficiency, unspecified: Secondary | ICD-10-CM | POA: Insufficient documentation

## 2019-11-07 NOTE — Progress Notes (Signed)
Blood counts are normal.

## 2019-11-10 ENCOUNTER — Ambulatory Visit (INDEPENDENT_AMBULATORY_CARE_PROVIDER_SITE_OTHER): Payer: Medicare PPO | Admitting: Internal Medicine

## 2019-11-10 ENCOUNTER — Encounter: Payer: Self-pay | Admitting: Internal Medicine

## 2019-11-10 ENCOUNTER — Other Ambulatory Visit: Payer: Self-pay

## 2019-11-10 VITALS — BP 122/78 | HR 80 | Temp 97.0°F | Ht 69.0 in | Wt 242.0 lb

## 2019-11-10 DIAGNOSIS — D045 Carcinoma in situ of skin of trunk: Secondary | ICD-10-CM

## 2019-11-10 DIAGNOSIS — R0609 Other forms of dyspnea: Secondary | ICD-10-CM

## 2019-11-10 DIAGNOSIS — M1711 Unilateral primary osteoarthritis, right knee: Secondary | ICD-10-CM

## 2019-11-10 DIAGNOSIS — M4004 Postural kyphosis, thoracic region: Secondary | ICD-10-CM

## 2019-11-10 DIAGNOSIS — Z6835 Body mass index (BMI) 35.0-35.9, adult: Secondary | ICD-10-CM | POA: Insufficient documentation

## 2019-11-10 DIAGNOSIS — I890 Lymphedema, not elsewhere classified: Secondary | ICD-10-CM | POA: Diagnosis not present

## 2019-11-10 DIAGNOSIS — E1159 Type 2 diabetes mellitus with other circulatory complications: Secondary | ICD-10-CM

## 2019-11-10 DIAGNOSIS — E1169 Type 2 diabetes mellitus with other specified complication: Secondary | ICD-10-CM

## 2019-11-10 DIAGNOSIS — E785 Hyperlipidemia, unspecified: Secondary | ICD-10-CM

## 2019-11-10 DIAGNOSIS — R0600 Dyspnea, unspecified: Secondary | ICD-10-CM

## 2019-11-10 DIAGNOSIS — E1069 Type 1 diabetes mellitus with other specified complication: Secondary | ICD-10-CM

## 2019-11-10 NOTE — Progress Notes (Signed)
Location:  [facility] clinic Provider:  Alleen Kehm L. Mariea Clonts, D.O., C.M.D.  Goals of Care:  Advanced Directives 11/10/2019  Does Patient Have a Medical Advance Directive? Yes  Type of Advance Directive [place]  Does patient want to make changes to medical advance directive? No - Patient declined  Copy of [place] in Chart? -  Would patient like information on creating a medical advance directive? -   Chief Complaint  Patient presents with  . Medical Management of Chronic Issues    4 month follow up   . Health Maintenance    Influenza   high dose has not come in yet   . Acute Visit    knee pain, sleeping trouble, depression    HPI: Patient is a 87 y.o. male seen today for medical management of chronic diseases.  He is here with his son, Josph Macho.  None of his concerns are new, but persistent chronic conditions.  Not wearing his compression hose so I could see his legs.  He went to lymphedema therapy in [place].  He has a set of compression stockings and then garments with velcro on top.  Wore them all the time at first.  If it starts going back, add the garments back.  Wear the compression stockings daily.  He gets blisters on the right greater than left leg.  She gave him exercises/massages.  He stopped doing them.  They're using cerave cream daily.   He does not always sleep well at night but sleeps a lot in the daytime.  He admits that a little more discipline in the day might help--just one nap after lunch.  A couple of nights a week, might have trouble sleeping.    He also went to regular PT.  He was making good progress.  He has bands but he's not doing them per his son.  Discussed caregiver could help him get his exercises started.  She helps him with the compression garments.    Citalopram/celexa had been stopped before.  He does not think it's a major problem.  He says it would go away immediately if he didn't worry about the matters of the world.     Right knee pain:  It hurts.  He thinks it's from kneeling down and retrieving something.  Had a partial replacement 10 yrs ago.   Had a skin cancer removed from his back since.  Was squamous cell.  Being monitoring.  Has f/u 9/20  Past Medical History:  Diagnosis Date  . Benign prostatic hyperplasia with urinary obstruction   . Bladder neck obstruction 02/02/2012  . Colon polyps 09/01/2011  . Depression   . Diabetes mellitus without complication ([place]) 28/78/6767  . Dyslipidemia associated with type 2 diabetes mellitus ([place])   . Edema    edema in lower legs  . Hyperlipemia   . Hypertension 09/01/2011  . Kyphosis deformity of spine   . Lung disease   . Melanoma ([place])    on back    Past Surgical History:  Procedure Laterality Date  . HERNIA REPAIR     1960's  . MEDIAL PARTIAL KNEE REPLACEMENT  2015  . melanoma removed from back  2008  . sub mucus  2015    Allergies  Allergen Reactions  . Lopressor [Metoprolol Tartrate]     Mental status changes (intolerance)   . Beta Adrenergic Blockers     "they make me depressed"    Outpatient Encounter Medications as of 11/10/2019  Medication Sig  . ACCU-CHEK GUIDE test strip   . albuterol (VENTOLIN HFA) 108 (90 Base) MCG/ACT inhaler Inhale 1 puff into the lungs as needed for wheezing or shortness of breath.  Marland Kitchen aspirin 81 MG tablet Take 81 mg by mouth daily.  . fenofibrate (TRICOR) 145 MG tablet Take 145 mg by mouth daily.  . finasteride (PROSCAR) 5 MG tablet Take 5 mg by mouth daily.  . fluticasone (FLONASE) 50 MCG/ACT nasal spray Place 1 spray into both nostrils daily.  . magnesium hydroxide (MILK OF MAGNESIA) 400 MG/5ML suspension 1/2 capful by mouth every night  . metFORMIN (GLUCOPHAGE) 500 MG tablet Take by mouth 2 (two) times daily with a meal.  . [DISCONTINUED] citalopram (CELEXA) 20 MG tablet Take 20 mg by mouth daily.  . [DISCONTINUED] doxycycline (VIBRA-TABS) 100 MG tablet Take 1 tablet (100 mg total) by mouth 2 (two)  times daily.  . [DISCONTINUED] lisinopril (ZESTRIL) 10 MG tablet Take 10 mg by mouth daily.  . [DISCONTINUED] meloxicam (MOBIC) 15 MG tablet Take 15 mg by mouth daily.  . [DISCONTINUED] OVER THE COUNTER MEDICATION Take 1 capsule by mouth 2 (two) times daily. Equate NiteTime Cold and Flu. Multi symptom Relief.  Aches, Fever: Acetaminophen Cough: Dextromethorphan HBr Sneezing & runny nose: Doxylamine Succinate.   No facility-administered encounter medications on file as of 11/10/2019.    Review of Systems:  Review of Systems  Constitutional: Positive for malaise/fatigue. Negative for chills, fever and weight loss.  HENT: Negative for congestion and sore throat.   Eyes: Negative for blurred vision.  Respiratory: Negative for cough and shortness of breath.   Cardiovascular: Positive for leg swelling. Negative for chest pain and palpitations.  Gastrointestinal: Negative for abdominal pain, blood in stool, constipation, diarrhea and melena.  Genitourinary: Negative for dysuria.  Musculoskeletal: Negative for falls and joint pain.  Skin: Negative for itching and rash.  Neurological: Positive for sensory change. Negative for dizziness and loss of consciousness.  Endo/Heme/Allergies: Bruises/bleeds easily.  Psychiatric/Behavioral: Positive for depression. The patient has insomnia. The patient is not nervous/anxious.     Health Maintenance  Topic Date Due  . INFLUENZA VACCINE  10/23/2019  . HEMOGLOBIN A1C  12/28/2019  . FOOT EXAM  05/19/2020  . OPHTHALMOLOGY EXAM  09/06/2020  . TETANUS/TDAP  02/16/2029  . COVID-19 Vaccine  Completed  . PNA vac Low Risk Adult  Completed    Physical Exam: Vitals:   11/10/19 1115  BP: 122/78  Pulse: 80  Temp: (!) 97 F (36.1 C)  TempSrc: Temporal  SpO2: 94%  Weight: 242 lb (109.8 kg)  Height: 5\' 9"  (1.753 m)   Body mass index is 35.74 kg/m. Physical Exam Vitals reviewed.  Constitutional:      General: He is not in acute distress.     Appearance: Normal appearance. He is obese. He is not toxic-appearing.  HENT:     Head: Normocephalic and atraumatic.  Cardiovascular:     Rate and Rhythm: Normal rate and regular rhythm.     Pulses: Normal pulses.     Heart sounds: Normal heart sounds.  Pulmonary:     Effort: Pulmonary effort is normal.     Breath sounds: Normal breath sounds. No wheezing, rhonchi or rales.  Abdominal:     General: Bowel sounds are normal.     Palpations: Abdomen is soft.  Musculoskeletal:        General: Normal range of motion.     Right lower leg: Edema present.     Left  lower leg: Edema present.     Comments: R>L  Skin:    Comments: Right erythema and ulcerations  Neurological:     General: No focal deficit present.     Mental Status: He is alert and oriented to person, place, and time.     Gait: Gait abnormal.     Comments: Uses rollator walker  Psychiatric:        Mood and Affect: Mood normal.        Behavior: Behavior normal.     Labs reviewed: Basic Metabolic Panel: Recent Labs    06/28/19 1021  NA 138  K 4.4  CL 100  CO2 34*  GLUCOSE 190*  BUN 22  CREATININE 1.03  CALCIUM 9.8  TSH 1.61   Liver Function Tests: Recent Labs    06/28/19 1021  AST 14  ALT 16  BILITOT 0.5  PROT 6.6   No results for input(s): LIPASE, AMYLASE in the last 8760 hours. No results for input(s): AMMONIA in the last 8760 hours. CBC: Recent Labs    06/28/19 1021 11/04/19 1001  WBC 6.6 6.0  NEUTROABS 4,514 3,738  HGB 14.7 15.2  HCT 45.6 47.8  MCV 97.6 98.8  PLT 192 209   Lipid Panel: Recent Labs    06/28/19 1021  CHOL 230*  HDL 58  LDLCALC 138*  TRIG 203*  CHOLHDL 4.0   Lab Results  Component Value Date   HGBA1C 8.0 (H) 06/28/2019   Assessment/Plan 1. Lymphedema of both lower extremities -continue compression socks and wraps per lymphedema clinic  2. Body mass index (BMI) of 35.0-35.9 in adult -ongoing, counseled on how this has affected his respiratory status and  mobility  3. Class 2 severe obesity due to excess calories with serious comorbidity and body mass index (BMI) of 35.0 to 35.9 in adult [facility]) -encouraged healthy eating habits and increased physical activity--limited by his restrictive lung disease due in part to this and in part to his kyphoscoliosis -encouraged him to do the therapy exercises he was provided when he had outpatient PT b/c he's not been faithful about this  4. Squamous cell carcinoma in situ (SCCIS) of skin of back -had small place removed, follow-up as directed -prior melanoma years ago so monitored closely  5. Primary osteoarthritis of right knee -ongoing, encouraged tylenol, ice, topicals, maintaining activity to keep it lubricated/prevent stiffness -do PT exercises as directed  6. Postural kyphosis of thoracic region -ongoing, contributes to respiratory status  7. Dyspnea on exertion -due to restrictive lung disease from kyphosis and obesity -encouraged attempts at staying active  8. Dyslipidemia associated with type 2 diabetes mellitus ([place]) - was well above goal, not on statin at 87 and due to prior intolerability, continues on fenofibrate for high TG Lab Results  Component Value Date   LDLCALC 138 (H) 06/28/2019   - COMPLETE METABOLIC PANEL WITH GFR - Hemoglobin A1c  9. Controlled type 2 diabetes mellitus with other complication ([facility]) - Hemoglobin A1c Lab Results  Component Value Date   HGBA1C 8.3 (H) 11/10/2019  -cont metformin, asa, work on diet and exercise  Labs/tests ordered:   Lab Orders     COMPLETE METABOLIC PANEL WITH GFR Labs had not been done properly at [employer] st station so cmp had to be done here  Next appt:  03/12/2020   Eloyse Causey L. Callan Norden, D.O. [facility] [address], [place] [address] Cell Phone (Mon-Fri 8am-5pm):  [phone] On Call:  [phone] & follow prompts after 5pm & weekends Office Phone:   [phone] Office Fax:  [phone]

## 2019-11-10 NOTE — Patient Instructions (Signed)
Be faithful with your massages for lymphedema.  Try to be active in the daytime and avoid napping so you sleep better at night. If you are unable to keep up with that, melatonin 3mg  an hour before bed might help you. Other meds are not safe.

## 2019-11-11 LAB — COMPLETE METABOLIC PANEL WITH GFR
AG Ratio: 1.7 (calc) (ref 1.0–2.5)
ALT: 16 U/L (ref 9–46)
AST: 15 U/L (ref 10–35)
Albumin: 4.1 g/dL (ref 3.6–5.1)
Alkaline phosphatase (APISO): 58 U/L (ref 35–144)
BUN: 22 mg/dL (ref 7–25)
CO2: 31 mmol/L (ref 20–32)
Calcium: 10 mg/dL (ref 8.6–10.3)
Chloride: 100 mmol/L (ref 98–110)
Creat: 1.03 mg/dL (ref 0.70–1.11)
GFR, Est African American: 76 mL/min/{1.73_m2} (ref >=60)
GFR, Est Non African American: 66 mL/min/{1.73_m2} (ref >=60)
Globulin: 2.4 g/dL (calc) (ref 1.9–3.7)
Glucose, Bld: 201 mg/dL — ABNORMAL HIGH (ref 65–99)
Potassium: 4.8 mmol/L (ref 3.5–5.3)
Sodium: 139 mmol/L (ref 135–146)
Total Bilirubin: 0.4 mg/dL (ref 0.2–1.2)
Total Protein: 6.5 g/dL (ref 6.1–8.1)

## 2019-11-11 LAB — HEMOGLOBIN A1C
Hgb A1c MFr Bld: 8.3 %{Hb} — ABNORMAL HIGH (ref <5.7)
Mean Plasma Glucose: 192 (calc)
eAG (mmol/L): 10.6 (calc)

## 2019-11-11 NOTE — Progress Notes (Signed)
Sugar average over the past 3 months has trended up to 8.3 from 8.   Electrolytes, liver and kidneys are ok.   We have a couple of options for managing this:  adding jardiance 10mg  (or a similar agent if it's not on formulary) which has the advantage of heart protection along with lowering hba1c and it has some diuretic effect which would help swelling OR increasing the metformin dose.   Goal hba1c for him is less than 8 because of his difficulty getting around and other chronic illnesses.

## 2019-11-15 ENCOUNTER — Telehealth: Payer: Self-pay | Admitting: *Deleted

## 2019-11-15 MED ORDER — EMPAGLIFLOZIN 10 MG PO TABS
10.0000 mg | ORAL_TABLET | Freq: Every day | ORAL | 5 refills | Status: DC
Start: 2019-11-15 — End: 2019-12-22

## 2019-11-15 NOTE — Telephone Encounter (Signed)
Brian [name], son called and stated that he received patient's results and wants to know what you would recommend for him to take for the blood sugar.   Please Advise.     Reed, Tiffany L, DO  [facility] Sugar average over the past 3 months has trended up to 8.3 from 8.   Electrolytes, liver and kidneys are ok.   We have a couple of options for managing this: adding jardiance 10mg  (or a similar agent if it's not on formulary) which has the advantage of heart protection along with lowering hba1c and it has some diuretic effect which would help swelling OR increasing the metformin dose.   Goal hba1c for him is less than 8 because of his difficulty getting around and other chronic illnesses.

## 2019-11-15 NOTE — Telephone Encounter (Signed)
Patient son notified and agreed. Medication list updated and sent to pharmacy.

## 2019-11-15 NOTE — Telephone Encounter (Signed)
It sounds like he was not called with the results because there were questions in the result note and I did not receive anything back.  I'd suggest we start him on jardiance 10mg  daily for his diabetes.

## 2019-12-19 ENCOUNTER — Telehealth: Payer: Self-pay

## 2019-12-19 NOTE — Telephone Encounter (Signed)
Incoming call received from patients son Josph Macho stating at last visit patient discussed some right knee pain and it was dismissed as arthritis.   [name] later found out that patient had a fall and possibly injured right knee. Patient is still with pain and Josph Macho is questioning if patient should be see at ortho for further evaluation. Patient was seen at [facility] a few years ago.  Josph Macho is aware that I will send this message for Dr.Reed to review and advise

## 2019-12-19 NOTE — Telephone Encounter (Signed)
Let's schedule him for an acute visit here to be evaluated for his knee pain.

## 2019-12-20 NOTE — Telephone Encounter (Signed)
Spoke with Josph Macho, scheduled appointment for Thursday 12/22/2019 at 11:00 am with Dr.Reed to further discuss

## 2019-12-22 ENCOUNTER — Other Ambulatory Visit: Payer: Self-pay

## 2019-12-22 ENCOUNTER — Ambulatory Visit (INDEPENDENT_AMBULATORY_CARE_PROVIDER_SITE_OTHER): Payer: Medicare PPO | Admitting: Internal Medicine

## 2019-12-22 ENCOUNTER — Ambulatory Visit
Admission: RE | Admit: 2019-12-22 | Discharge: 2019-12-22 | Disposition: A | Discharge status: Home / Self Care | Payer: Medicare PPO | Source: Ambulatory Visit | Attending: Internal Medicine | Admitting: Internal Medicine

## 2019-12-22 ENCOUNTER — Encounter: Payer: Self-pay | Admitting: Internal Medicine

## 2019-12-22 VITALS — BP 120/70 | HR 81 | Temp 97.8°F | Resp 16 | Ht 69.0 in | Wt 236.2 lb

## 2019-12-22 DIAGNOSIS — Z23 Encounter for immunization: Secondary | ICD-10-CM | POA: Diagnosis not present

## 2019-12-22 DIAGNOSIS — I890 Lymphedema, not elsewhere classified: Secondary | ICD-10-CM

## 2019-12-22 DIAGNOSIS — S8991XA Unspecified injury of right lower leg, initial encounter: Secondary | ICD-10-CM | POA: Diagnosis not present

## 2019-12-22 DIAGNOSIS — C434 Malignant melanoma of scalp and neck: Secondary | ICD-10-CM

## 2019-12-22 MED ORDER — DICLOFENAC SODIUM 1 % EX GEL: 4.0000 g | Freq: Four times a day (QID) | CUTANEOUS | 3 refills | Status: DC

## 2019-12-22 NOTE — Progress Notes (Signed)
Location:  [facility] clinic Provider: Jocsan Mcginley L. Mariea Clonts, D.O., C.M.D.  Code Status: DNR Goals of Care:  Advanced Directives 12/22/2019  Does Patient Have a Medical Advance Directive? Yes  Type of Advance Directive [place]  Does patient want to make changes to medical advance directive? No - Patient declined  Copy of [place] in Chart? -  Would patient like information on creating a medical advance directive? -   Chief Complaint  Patient presents with  . Acute Visit    Right Knee Pain x 50months/ Discuss the need for Booster Shot    HPI: Patient is a 86 y.o. male seen today for an acute visit for right knee pain for 6 months, discuss covid booster and look behind his ear b/c he had melanoma in the past.  Getting flu shot today.  Pain in right knee is wore.  He didn't wear his stocking today so I could check it.  He did lie about wearing it daily.  He does wear his socks not the wrap.  Pain is like just before his partial replacement.  It used to be relieved with cortisone shots.  It really hurts when he tries to bend it all the way back.  He fell down on it several months ago--he was fixing a faucet and went down on it.    2 wks ago, he was seen for a place behind his left ear.  Dr. Pearline Cables did a biopsy--it was melanoma--Brian [name] took him back to have it taken off.  He is to f/u in November.   It tingles and itches in his nose some since he fell and smashed his nose.    Past Medical History:  Diagnosis Date  . Benign prostatic hyperplasia with urinary obstruction   . Bladder neck obstruction 02/02/2012  . Colon polyps 09/01/2011  . Depression   . Diabetes mellitus without complication ([place]) 50/27/7412  . Dyslipidemia associated with type 2 diabetes mellitus ([place])   . Edema    edema in lower legs  . Hyperlipemia   . Hypertension 09/01/2011  . Kyphosis deformity of spine   . Lung disease   . Melanoma ([place])    on back    Past Surgical History:   Procedure Laterality Date  . HERNIA REPAIR     1960's  . MEDIAL PARTIAL KNEE REPLACEMENT  2015  . melanoma removed from back  2008  . sub mucus  2015    Allergies  Allergen Reactions  . Lopressor [Metoprolol Tartrate]     Mental status changes (intolerance)   . Beta Adrenergic Blockers     "they make me depressed"    Outpatient Encounter Medications as of 12/22/2019  Medication Sig  . ACCU-CHEK GUIDE test strip   . albuterol (VENTOLIN HFA) 108 (90 Base) MCG/ACT inhaler Inhale 1 puff into the lungs as needed for wheezing or shortness of breath.  Marland Kitchen aspirin 81 MG tablet Take 81 mg by mouth daily.  . empagliflozin (JARDIANCE) 10 MG TABS tablet Take 10 mg by mouth at bedtime.  . fenofibrate (TRICOR) 145 MG tablet Take 145 mg by mouth daily.  . finasteride (PROSCAR) 5 MG tablet Take 5 mg by mouth daily.  . fluticasone (FLONASE) 50 MCG/ACT nasal spray Place 1 spray into both nostrils daily.  . magnesium hydroxide (MILK OF MAGNESIA) 400 MG/5ML suspension 1/2 capful by mouth every night  . metFORMIN (GLUCOPHAGE) 500 MG tablet Take by mouth 2 (two) times daily with a meal.  . [  DISCONTINUED] empagliflozin (JARDIANCE) 10 MG TABS tablet Take 1 tablet (10 mg total) by mouth daily. (Patient taking differently: Take 10 mg by mouth. )   No facility-administered encounter medications on file as of 12/22/2019.    Review of Systems:  Review of Systems  Constitutional: Negative for chills and fever.  HENT: Positive for congestion.   Eyes: Positive for blurred vision.  Respiratory: Positive for shortness of breath. Negative for cough.   Cardiovascular: Positive for leg swelling. Negative for chest pain and palpitations.  Gastrointestinal: Negative for abdominal pain and constipation.  Genitourinary: Negative for dysuria.  Musculoskeletal: Positive for falls and joint pain.  Skin:       Chronic erythema of lower legs  Neurological: Negative for dizziness and loss of consciousness.   Psychiatric/Behavioral: Positive for memory loss. Negative for depression. The patient is not nervous/anxious and does not have insomnia.     Health Maintenance  Topic Date Due  . URINE MICROALBUMIN  Never done  . INFLUENZA VACCINE  10/23/2019  . HEMOGLOBIN A1C  05/12/2020  . FOOT EXAM  05/19/2020  . OPHTHALMOLOGY EXAM  09/06/2020  . TETANUS/TDAP  02/16/2029  . COVID-19 Vaccine  Completed  . PNA vac Low Risk Adult  Completed    Physical Exam: Vitals:   12/22/19 1116  BP: 120/70  Pulse: 81  Resp: 16  Temp: 97.8 F (36.6 C)  SpO2: 94%  Weight: 236 lb 3.2 oz (107.1 kg)  Height: 5\' 9"  (1.753 m)   Body mass index is 34.88 kg/m. Physical Exam Vitals reviewed.  Constitutional:      Appearance: Normal appearance.  HENT:     Nose:     Comments: Nose was without explanation for symptoms Cardiovascular:     Rate and Rhythm: Normal rate and regular rhythm.  Pulmonary:     Breath sounds: Normal breath sounds. No wheezing, rhonchi or rales.     Comments: Dyspneic just trying to take his arm out of his shirt for flu shot Abdominal:     General: Bowel sounds are normal.  Musculoskeletal:        General: Normal range of motion.     Right lower leg: Edema present.     Left lower leg: Edema present.     Comments: Kyphosis; uses walker  Skin:    Comments: Erythema of bilateral lower legs R>L, lymphedema present; round opening slowly healing left neck behind ear  Neurological:     General: No focal deficit present.     Mental Status: He is alert and oriented to person, place, and time.     Motor: Weakness present.     Gait: Gait abnormal.     Comments: Having more difficulty recalling things today and getting irritated with himself and his DIL when she helped  Psychiatric:        Mood and Affect: Mood normal.     Labs reviewed: Basic Metabolic Panel: Recent Labs    06/28/19 1021 11/10/19 1218  NA 138 139  K 4.4 4.8  CL 100 100  CO2 34* 31  GLUCOSE 190* 201*  BUN  22 22  CREATININE 1.03 1.03  CALCIUM 9.8 10.0  TSH 1.61  --    Liver Function Tests: Recent Labs    06/28/19 1021 11/10/19 1218  AST 14 15  ALT 16 16  BILITOT 0.5 0.4  PROT 6.6 6.5   No results for input(s): LIPASE, AMYLASE in the last 8760 hours. No results for input(s): AMMONIA in the last 8760  hours. CBC: Recent Labs    06/28/19 1021 11/04/19 1001  WBC 6.6 6.0  NEUTROABS 4,514 3,738  HGB 14.7 15.2  HCT 45.6 47.8  MCV 97.6 98.8  PLT 192 209   Lipid Panel: Recent Labs    06/28/19 1021  CHOL 230*  HDL 58  LDLCALC 138*  TRIG 203*  CHOLHDL 4.0   Lab Results  Component Value Date   HGBA1C 8.3 (H) 11/10/2019   Assessment/Plan 1. Injury of right knee, initial encounter - several weeks ago, but still having pain with prolonged standing and with full flexion, prior partial knee arthroplasty - DG Knee Complete 4 Views Right; Future -advised to use voltaren gel 4g up to qid   2. Lymphedema of both lower extremities -doing better with regular compression socks and the wrap on the right  3. Melanoma of left side of neck ([place]) -was removed--DIL to get clarification if just biopsied and not yet removed or already had full surgery   4. Need for influenza vaccination - Flu Vaccine QUAD High Dose(Fluad)  Labs/tests ordered:   Lab Orders  No laboratory test(s) ordered today   Next appt:  03/07/2020  Chonda Baney L. Jamyron Redd, D.O. [facility] [address], [place] [address] Cell Phone (Mon-Fri 8am-5pm):  [phone] On Call:  [phone] & follow prompts after 5pm & weekends Office Phone:  [phone] Office Fax:  [phone]

## 2019-12-22 NOTE — Patient Instructions (Signed)
Keep Dec appt

## 2019-12-26 NOTE — Progress Notes (Signed)
He has developed arthritis in the remaining compartments of his knee.  He has a small amount of fluid present but likely not enough to take out.  If the voltaren gel is not helping, they should let me know.

## 2020-03-07 ENCOUNTER — Other Ambulatory Visit: Payer: Medicare PPO

## 2020-03-07 DIAGNOSIS — E1169 Type 2 diabetes mellitus with other specified complication: Secondary | ICD-10-CM

## 2020-03-07 DIAGNOSIS — E1159 Type 2 diabetes mellitus with other circulatory complications: Secondary | ICD-10-CM

## 2020-03-08 LAB — HEMOGLOBIN A1C
Hgb A1c MFr Bld: 7.3 % of total Hgb — ABNORMAL HIGH (ref <5.7)
Mean Plasma Glucose: 163 mg/dL
eAG (mmol/L): 9 mmol/L

## 2020-03-08 LAB — BASIC METABOLIC PANEL
BUN: 15 mg/dL (ref 7–25)
CO2: 31 mmol/L (ref 20–32)
Calcium: 9.5 mg/dL (ref 8.6–10.3)
Chloride: 101 mmol/L (ref 98–110)
Creat: 0.86 mg/dL (ref 0.70–1.11)
Glucose, Bld: 126 mg/dL — ABNORMAL HIGH (ref 65–99)
Potassium: 4.5 mmol/L (ref 3.5–5.3)
Sodium: 139 mmol/L (ref 135–146)

## 2020-03-08 LAB — LIPID PANEL
Cholesterol: 199 mg/dL (ref <200)
HDL: 60 mg/dL (ref >=40)
LDL Cholesterol (Calc): 110 mg/dL (calc) — ABNORMAL HIGH
Non-HDL Cholesterol (Calc): 139 mg/dL (calc) — ABNORMAL HIGH (ref <130)
Total CHOL/HDL Ratio: 3.3 (calc) (ref <5.0)
Triglycerides: 175 mg/dL — ABNORMAL HIGH (ref <150)

## 2020-03-08 NOTE — Progress Notes (Signed)
Cholesterol and hba1c have both improved.  We'll discuss at his visit.

## 2020-03-12 ENCOUNTER — Encounter: Payer: Self-pay | Admitting: Internal Medicine

## 2020-03-12 ENCOUNTER — Ambulatory Visit (INDEPENDENT_AMBULATORY_CARE_PROVIDER_SITE_OTHER): Payer: Medicare PPO | Admitting: Internal Medicine

## 2020-03-12 ENCOUNTER — Other Ambulatory Visit: Payer: Self-pay

## 2020-03-12 VITALS — BP 124/70 | HR 68 | Temp 97.1°F | Ht 69.0 in | Wt 234.0 lb

## 2020-03-12 DIAGNOSIS — M4004 Postural kyphosis, thoracic region: Secondary | ICD-10-CM

## 2020-03-12 DIAGNOSIS — C434 Malignant melanoma of scalp and neck: Secondary | ICD-10-CM

## 2020-03-12 DIAGNOSIS — M1711 Unilateral primary osteoarthritis, right knee: Secondary | ICD-10-CM

## 2020-03-12 DIAGNOSIS — Z7189 Other specified counseling: Secondary | ICD-10-CM

## 2020-03-12 DIAGNOSIS — E785 Hyperlipidemia, unspecified: Secondary | ICD-10-CM

## 2020-03-12 DIAGNOSIS — Z6835 Body mass index (BMI) 35.0-35.9, adult: Secondary | ICD-10-CM

## 2020-03-12 DIAGNOSIS — I890 Lymphedema, not elsewhere classified: Secondary | ICD-10-CM | POA: Diagnosis not present

## 2020-03-12 DIAGNOSIS — E1169 Type 2 diabetes mellitus with other specified complication: Secondary | ICD-10-CM

## 2020-03-12 NOTE — Progress Notes (Signed)
Location:  [facility] clinic Provider:  Dayn Barich L. Mariea Clonts, D.O., C.M.D.  Code Status: Discussed in detail today; pt reports that he's in favor of machines to prolong life it the outcome is expected to be good; educated him about data on outcomes of CPR outside of the hospital in this state, changes with aging, risk of poor quality outcome; he is thinking about whether he would want to have CPR if he had a cardiopulmonary arrest.  He shared an experience with a relative that was negative where his relative had to intervene to keep someone off a machine who did not want to be put on one b/c his qol was poor already.  His son was resistant to the idea of no CPR at first but did seem to come around when his dad was not certain about whether he'd want it and the data was shared.  They do not think he has a living will, only HCPOA at home--they will find and bring a copy or share through [place].  Goals of Care:  Advanced Directives 03/12/2020  Does Patient Have a Medical Advance Directive? Yes  Type of Advance Directive [place]  Does patient want to make changes to medical advance directive? No - Patient declined  Copy of [place] in Chart? -  Would patient like information on creating a medical advance directive? -   Chief Complaint  Patient presents with  . Medical Management of Chronic Issues    4 month follow up. [place] are his legs and feet which are doing better.   Marland Kitchen Health Maintenance    Urine microalbumin    HPI: Patient is a 86 y.o. male seen today for medical management of chronic diseases.    He wants his right leg swelling cured and his balance better.  He did go back to the compression garments which did help.  He's using the compression socks daily now.  He gets blisters on the right leg.  He does elevate some during the day.  He sleeps on his side.    He notes that they did better when he swore off sugar.  He is disappointed that he's still  diabetic.   He's tolerating jardiance well.  Sleep:  He is resting better.  Nobody's heard him snore.  He brought it up b/c his son has moderate sleep apnea.    He has not come close to falling.  He is staying close to his walker.  He got a second walker from [employer]--it's sturdy, narrow and handles rough terrain well.    He had his melanoma behind his left ear removed.  Goes to [facility].  It had been years since he had a problem before that.    Breathing ok.  Same.  He has not used his albuterol at all lately.  Even on pretty days outside, he's not been wheezing.    Every now and then, he has to blow his nose--sometimes watery, sometimes thick--no other symptoms.  Resolves after he blows his nose.  He just calibrated his meter in the house to figure out the humidity which was just 20%.  He has no humidifier on it.  He has a portable one and needs it hooked up.  Right knee only hurting if on it a long time.  Has the spurs in his back, but hasn't taken a tylenol for them in a year.  Tried some topicals that help.  Using voltaren some for the knee.  Past Medical History:  Diagnosis Date  . Benign prostatic hyperplasia with urinary obstruction   . Bladder neck obstruction 02/02/2012  . Colon polyps 09/01/2011  . Depression   . Diabetes mellitus without complication ([place]) 24/58/0998  . Dyslipidemia associated with type 2 diabetes mellitus ([place])   . Edema    edema in lower legs  . Hyperlipemia   . Hypertension 09/01/2011  . Kyphosis deformity of spine   . Lung disease   . Melanoma ([place])    on back    Past Surgical History:  Procedure Laterality Date  . HERNIA REPAIR     1960's  . MEDIAL PARTIAL KNEE REPLACEMENT  2015  . melanoma removed from back  2008  . sub mucus  2015    Allergies  Allergen Reactions  . Lopressor [Metoprolol Tartrate]     Mental status changes (intolerance)   . Beta Adrenergic Blockers     "they make me depressed"    Outpatient Encounter  Medications as of 03/12/2020  Medication Sig  . ACCU-CHEK GUIDE test strip   . albuterol (VENTOLIN HFA) 108 (90 Base) MCG/ACT inhaler Inhale 1 puff into the lungs as needed for wheezing or shortness of breath.  Marland Kitchen aspirin 81 MG tablet Take 81 mg by mouth daily.  . diclofenac Sodium (VOLTAREN) 1 % GEL Apply 4 g topically 4 (four) times daily.  . empagliflozin (JARDIANCE) 10 MG TABS tablet Take 10 mg by mouth at bedtime.  . fenofibrate (TRICOR) 145 MG tablet Take 145 mg by mouth daily.  . finasteride (PROSCAR) 5 MG tablet Take 5 mg by mouth daily.  . fluticasone (FLONASE) 50 MCG/ACT nasal spray Place 1 spray into both nostrils daily.  . magnesium hydroxide (MILK OF MAGNESIA) 400 MG/5ML suspension 1/2 capful by mouth every night  . metFORMIN (GLUCOPHAGE) 500 MG tablet Take by mouth 2 (two) times daily with a meal.   No facility-administered encounter medications on file as of 03/12/2020.    Review of Systems:  Review of Systems  Constitutional: Negative for chills, fever and malaise/fatigue.  HENT: Positive for hearing loss. Negative for congestion and sore throat.   Eyes: Negative for blurred vision.       Has retinopathy  Respiratory: Positive for shortness of breath. Negative for cough and wheezing.   Cardiovascular: Negative for chest pain, palpitations and leg swelling.  Gastrointestinal: Negative for abdominal pain and constipation.  Genitourinary: Negative for dysuria.  Musculoskeletal: Positive for back pain and joint pain. Negative for falls.  Skin: Negative for itching and rash.  Neurological: Negative for dizziness and loss of consciousness.  Endo/Heme/Allergies: Bruises/bleeds easily.  Psychiatric/Behavioral: Positive for memory loss. Negative for depression. The patient is not nervous/anxious and does not have insomnia.        Sleeping better    Health Maintenance  Topic Date Due  . URINE MICROALBUMIN  Never done  . FOOT EXAM  05/19/2020  . COVID-19 Vaccine (4 - Booster  for Moderna series) 07/29/2020  . HEMOGLOBIN A1C  09/05/2020  . OPHTHALMOLOGY EXAM  09/06/2020  . TETANUS/TDAP  02/16/2029  . INFLUENZA VACCINE  Completed  . PNA vac Low Risk Adult  Completed    Physical Exam: Vitals:   03/12/20 1445  BP: 124/70  Pulse: 68  Temp: (!) 97.1 F (36.2 C)  TempSrc: Temporal  SpO2: 93%  Weight: 234 lb (106.1 kg)  Height: 5\' 9"  (1.753 m)   Body mass index is 34.56 kg/m. Physical Exam Vitals reviewed.  Constitutional:  General: He is not in acute distress.    Appearance: Normal appearance. He is not toxic-appearing.  HENT:     Head: Normocephalic and atraumatic.  Eyes:     Conjunctiva/sclera: Conjunctivae normal.     Pupils: Pupils are equal, round, and reactive to light.  Cardiovascular:     Rate and Rhythm: Normal rate and regular rhythm.  Pulmonary:     Effort: Pulmonary effort is normal.     Breath sounds: No wheezing, rhonchi or rales.     Comments: Dyspnea getting up onto exam table Abdominal:     General: Bowel sounds are normal.  Musculoskeletal:        General: Normal range of motion.     Right lower leg: Edema present.     Left lower leg: Edema present.     Comments: Right leg with blistering,wearing compression socks  Skin:    General: Skin is warm and dry.  Neurological:     General: No focal deficit present.     Mental Status: He is alert and oriented to person, place, and time.     Motor: No weakness.     Gait: Gait normal.  Psychiatric:        Mood and Affect: Mood normal.        Behavior: Behavior normal.     Labs reviewed: Basic Metabolic Panel: Recent Labs    06/28/19 1021 11/10/19 1218 03/07/20 1032  NA 138 139 139  K 4.4 4.8 4.5  CL 100 100 101  CO2 34* 31 31  GLUCOSE 190* 201* 126*  BUN 22 22 15   CREATININE 1.03 1.03 0.86  CALCIUM 9.8 10.0 9.5  TSH 1.61  --   --    Liver Function Tests: Recent Labs    06/28/19 1021 11/10/19 1218  AST 14 15  ALT 16 16  BILITOT 0.5 0.4  PROT 6.6 6.5    No results for input(s): LIPASE, AMYLASE in the last 8760 hours. No results for input(s): AMMONIA in the last 8760 hours. CBC: Recent Labs    06/28/19 1021 11/04/19 1001  WBC 6.6 6.0  NEUTROABS 4,514 3,738  HGB 14.7 15.2  HCT 45.6 47.8  MCV 97.6 98.8  PLT 192 209   Lipid Panel: Recent Labs    06/28/19 1021 03/07/20 1032  CHOL 230* 199  HDL 58 60  LDLCALC 138* 110*  TRIG 203* 175*  CHOLHDL 4.0 3.3   Lab Results  Component Value Date   HGBA1C 7.3 (H) 03/07/2020    Procedures since last visit: No results found.  Assessment/Plan 1. ACP (advance care planning) -18 mins spent on ACP today--need copy of HCPOA and also he needs to decide about code status  2. Lymphedema of both lower extremities -resumed compression wraps when bad for 23 hrs, stockings b/w, elevate feet   3. Melanoma of left side of neck ([place]) -s/p resection  4. Class 2 severe obesity due to excess calories with serious comorbidity and body mass index (BMI) of 35.0 to 35.9 in adult [facility]) -continues, unable to exercise due to dyspnea  5. Primary osteoarthritis of right knee -bothersome at times, cont voltaren gel  6. Postural kyphosis of thoracic region -ongoing and cause of restrictive lung disease along with abdominal obesity  7. Dyslipidemia associated with type 2 diabetes mellitus (H[facility]) -cont fenofibrate , metformin, jardiance, control improved  Labs/tests ordered:   Lab Orders     BASIC METABOLIC PANEL WITH GFR     Hemoglobin A1c  Next appt:  07/12/2020   Eldwin Volkov L. Dyllon Henken, D.O. [facility] [address], [place] [address] Cell Phone (Mon-Fri 8am-5pm):  [phone] On Call:  [phone] & follow prompts after 5pm & weekends Office Phone:  [phone] Office Fax:  [phone]

## 2020-05-13 ENCOUNTER — Other Ambulatory Visit: Payer: Self-pay | Admitting: Internal Medicine

## 2020-05-14 ENCOUNTER — Encounter: Payer: Self-pay | Admitting: Internal Medicine

## 2020-05-25 ENCOUNTER — Encounter: Payer: Self-pay | Admitting: Family

## 2020-05-25 ENCOUNTER — Other Ambulatory Visit: Payer: Self-pay

## 2020-05-25 ENCOUNTER — Ambulatory Visit (INDEPENDENT_AMBULATORY_CARE_PROVIDER_SITE_OTHER): Payer: Medicare PPO | Admitting: Family

## 2020-05-25 ENCOUNTER — Ambulatory Visit: Payer: Medicare PPO | Admitting: Family

## 2020-05-25 VITALS — BP 138/80 | HR 93 | Temp 97.5°F | Resp 16 | Ht 69.0 in | Wt 234.0 lb

## 2020-05-25 DIAGNOSIS — R040 Epistaxis: Secondary | ICD-10-CM | POA: Diagnosis not present

## 2020-05-25 LAB — CBC WITH DIFFERENTIAL/PLATELET
Absolute Monocytes: 525 cells/uL (ref 200–950)
Basophils Absolute: 30 cells/uL (ref 0–200)
Basophils Relative: 0.4 %
Eosinophils Absolute: 8 cells/uL — ABNORMAL LOW (ref 15–500)
Eosinophils Relative: 0.1 %
HCT: 49.6 % (ref 38.5–50.0)
Hemoglobin: 16.1 g/dL (ref 13.2–17.1)
Lymphs Abs: 795 cells/uL — ABNORMAL LOW (ref 850–3900)
MCH: 31.2 pg (ref 27.0–33.0)
MCHC: 32.5 g/dL (ref 32.0–36.0)
MCV: 96.1 fL (ref 80.0–100.0)
MPV: 11.2 fL (ref 7.5–12.5)
Monocytes Relative: 7 %
Neutro Abs: 6143 cells/uL (ref 1500–7800)
Neutrophils Relative %: 81.9 %
Platelets: 228 10*3/uL (ref 140–400)
RBC: 5.16 10*6/uL (ref 4.20–5.80)
RDW: 12.4 % (ref 11.0–15.0)
Total Lymphocyte: 10.6 %
WBC: 7.5 10*3/uL (ref 3.8–10.8)

## 2020-05-25 NOTE — Patient Instructions (Signed)
- Hold Asprin 81 mg tablet for 3 days.   Nosebleed, Adult A nosebleed is when blood comes out of the nose. Nosebleeds are common and can be caused by many things. They are usually not a sign of a serious medical problem. Follow these instructions at home: When you have a nosebleed:  Sit down.  Tilt your head a little forward.  Follow these steps: 1. Pinch your nose with a clean towel or tissue. 2. Keep pinching your nose for 5 minutes. Do not let go. 3. After 5 minutes, let go of your nose. 4. If there is still bleeding, do these steps again. Keep doing these steps until the bleeding stops.  Do not put tissues or other things in your nose to stop the bleeding.  Avoid lying down or putting your head back.  Use a nose spray decongestant as told by your doctor.   After a nosebleed:  Try not to blow your nose or sniffle for several hours.  Try not to strain, lift, or bend at the waist for several days.  Aspirin and blood-thinning medicines make bleeding more likely. If you take these medicines: ? Ask your doctor if you should stop taking them or if you should change how much you take. ? Do not stop taking the medicine unless your doctor tells you to.  If your nosebleed was caused by dryness, use over-the-counter saline nasal spray or gel and humidifier as told by your doctor. This will keep the inside of your nose moist and allow it to heal. If you need to use one of these products: ? Choose one that is water-soluble. ? Use only as much as you need and use it only as often as needed. ? Do not lie down right away after you use it.  If you get nosebleeds often, talk with your doctor about treatments. These may include: ? Nasal cautery. A chemical swab or electrical device is used to lightly burn tiny blood vessels inside the nose. This helps stop or prevent nosebleeds. ? Nasal packing. A gauze or other material is placed in the nose to keep constant pressure on the bleeding  area. Contact a doctor if:  You have a fever.  You get nosebleeds often.  You are getting nosebleeds more often than usual.  You bruise very easily.  You have something stuck in your nose.  You have bleeding in your mouth.  You vomit or cough up brown material.  You get a nosebleed after you start a new medicine. Get help right away if:  You have a nosebleed after you fall or hurt your head.  Your nosebleed does not go away after 20 minutes.  You feel dizzy or weak.  You have unusual bleeding from other parts of your body.  You have unusual bruising on other parts of your body.  You get sweaty.  You vomit blood. Summary  Nosebleeds are common. They are usually not a sign of a serious medical problem.  When you have a nosebleed, sit down and tilt your head a little forward. Pinch your nose with a clean tissue for 5 minutes.  Use saline spray or saline gel and a humidifier as told by your doctor.  Get help right away if your nosebleed does not go away after 20 minutes. This information is not intended to replace advice given to you by your health care provider. Make sure you discuss any questions you have with your health care provider. Document Revised: 01/06/2019 Document Reviewed:  01/06/2019 Elsevier Patient Education  2021 [employer].

## 2020-05-27 NOTE — Progress Notes (Signed)
Provider: Johney Perotti FNP-C  Gayland Curry, DO  Patient Care Team: Gayland Curry, DO as PCP - General (Geriatric Medicine) Myrlene Broker, MD as Attending Physician (Urology) Ralene Bathe, MD (Ophthalmology) [name], MD as Referring Physician  Extended Emergency Contact Information Primary Emergency Contact: Vanmeter,[name]  [place] Phone: [phone] Relation: Son  Code Status: Full code  Goals of care: Advanced Directive information Advanced Directives 05/25/2020  Does Patient Have a Medical Advance Directive? Yes  Type of Advance Directive Out of facility DNR (pink MOST or yellow form);Healthcare Power of Attorney  Does patient want to make changes to medical advance directive? No - Patient declined  Copy of [place] in Chart? Yes - validated most recent copy scanned in chart (See row information)  Would patient like information on creating a medical advance directive? -     Chief Complaint  Patient presents with  . Acute Visit    Nose Bleed.    HPI:  Pt is a 87 y.o. male seen today for an acute visit for evaluation of nose bleed.He is here with POA states EMS was called due to right nare nose bleed.states blood pressure was high.EMS sprayed Afrin which helped to slow down nose bleed.Has been using paper towel to pack the nose  He denies any headache,dizziness or lightheaded. CBG was 354  Also denies any fever or chills.   Past Medical History:  Diagnosis Date  . Benign prostatic hyperplasia with urinary obstruction   . Bladder neck obstruction 02/02/2012  . Colon polyps 09/01/2011  . Depression   . Diabetes mellitus without complication ([place]) 53/29/9242  . Dyslipidemia associated with type 2 diabetes mellitus ([place])   . Edema    edema in lower legs  . Hyperlipemia   . Hypertension 09/01/2011  . Kyphosis deformity of spine   . Lung disease   . Melanoma ([place])    on back   Past Surgical History:   Procedure Laterality Date  . HERNIA REPAIR     1960's  . MEDIAL PARTIAL KNEE REPLACEMENT  2015  . melanoma removed from back  2008  . sub mucus  2015    Allergies  Allergen Reactions  . Lopressor [Metoprolol Tartrate]     Mental status changes (intolerance)   . Beta Adrenergic Blockers     "they make me depressed"    Outpatient Encounter Medications as of 05/25/2020  Medication Sig  . ACCU-CHEK GUIDE test strip   . albuterol (VENTOLIN HFA) 108 (90 Base) MCG/ACT inhaler Inhale 1 puff into the lungs as needed for wheezing or shortness of breath.  Marland Kitchen aspirin 81 MG tablet Take 81 mg by mouth daily.  . diclofenac Sodium (VOLTAREN) 1 % GEL Apply 4 g topically 4 (four) times daily.  . fenofibrate (TRICOR) 145 MG tablet Take 145 mg by mouth daily.  . finasteride (PROSCAR) 5 MG tablet Take 5 mg by mouth daily.  . fluticasone (FLONASE) 50 MCG/ACT nasal spray Place 1 spray into both nostrils daily.  Marland Kitchen JARDIANCE 10 MG TABS tablet TAKE 1 TABLET BY MOUTH EVERY DAY  . magnesium hydroxide (MILK OF MAGNESIA) 400 MG/5ML suspension 1/2 capful by mouth every night  . metFORMIN (GLUCOPHAGE) 500 MG tablet Take by mouth 2 (two) times daily with a meal.   No facility-administered encounter medications on file as of 05/25/2020.    Review of Systems  Constitutional: Negative for appetite change, chills, fatigue and fever.  HENT: Positive for nosebleeds. Negative  for congestion, hearing loss, rhinorrhea, sinus pressure, sinus pain, sneezing and sore throat.   Eyes: Negative for discharge, redness and itching.  Respiratory: Negative for cough, chest tightness, shortness of breath and wheezing.   Cardiovascular: Negative for chest pain, palpitations and leg swelling.  Skin: Negative for color change, pallor and rash.  Neurological: Negative for dizziness, speech difficulty, weakness, light-headedness and headaches.  Hematological: Does not bruise/bleed easily.    Immunization History  Administered  Date(s) Administered  . Fluad Quad(high Dose 65+) 12/22/2019  . H1N1 03/27/2008, 03/27/2008  . Influenza, High Dose Seasonal PF 12/18/2014, 11/26/2016, 12/30/2017, 12/30/2017, 12/27/2018, 12/27/2018  . Influenza, Quadrivalent, Recombinant, Inj, Pf 12/29/2012  . Influenza,inj,quad, With Preservative 12/30/2017  . Influenza,trivalent, recombinat, inj, PF 12/14/2009  . Influenza-Unspecified 12/14/2009, 12/30/2017, 12/30/2017, 12/27/2018  . Moderna Sars-Covid-2 Vaccination 05/07/2019, 06/04/2019, 01/30/2020  . Pneumococcal Conjugate-13 04/20/2013  . Pneumococcal Polysaccharide-23 10/27/2006  . Tdap 02/08/2015, 02/08/2015, 02/17/2019   Pertinent  Health Maintenance Due  Topic Date Due  . URINE MICROALBUMIN  Never done  . FOOT EXAM  05/19/2020  . HEMOGLOBIN A1C  09/05/2020  . OPHTHALMOLOGY EXAM  09/06/2020  . INFLUENZA VACCINE  Completed  . PNA vac Low Risk Adult  Completed   Fall Risk  05/25/2020 03/12/2020 12/22/2019 11/10/2019 08/04/2019  Falls in the past year? 0 0 0 1 0  Number falls in past yr: 0 0 0 0 0  Injury with Fall? 0 0 0 1 0  Comment - - - - -   Functional Status Survey:    Vitals:   05/25/20 1133  BP: 138/80  Pulse: 93  Resp: 16  Temp: (!) 97.5 F (36.4 C)  SpO2: 96%  Weight: 234 lb (106.1 kg)  Height: 5\' 9"  (1.753 m)   Body mass index is 34.56 kg/m. Physical Exam Vitals reviewed.  Constitutional:      General: He is not in acute distress.    Appearance: He is obese. He is not ill-appearing.  HENT:     Head: Normocephalic.     Right Ear: Tympanic membrane, ear canal and external ear normal. There is no impacted cerumen.     Left Ear: Tympanic membrane, ear canal and external ear normal. There is no impacted cerumen.     Nose: Nose normal. No congestion or rhinorrhea.     Right Turbinates: Not enlarged or swollen.     Left Turbinates: Not enlarged or swollen.     Right Sinus: No maxillary sinus tenderness or frontal sinus tenderness.     Left Sinus: No  maxillary sinus tenderness or frontal sinus tenderness.     Comments: Bright red blood noted in right nare but no active bleeding noted.    Mouth/Throat:     Mouth: Mucous membranes are moist.     Pharynx: Oropharynx is clear. No oropharyngeal exudate or posterior oropharyngeal erythema.     Comments: Clotted blood noted at the back of the throat  Neck:     Vascular: No carotid bruit.  Cardiovascular:     Rate and Rhythm: Normal rate and regular rhythm.     Pulses: Normal pulses.     Heart sounds: Normal heart sounds. No murmur heard. No friction rub. No gallop.   Pulmonary:     Effort: Pulmonary effort is normal. No respiratory distress.     Breath sounds: Normal breath sounds. No wheezing, rhonchi or rales.  Chest:     Chest wall: No tenderness.  Abdominal:     General: Bowel sounds are  normal. There is no distension.     Palpations: Abdomen is soft. There is no mass.     Tenderness: There is no abdominal tenderness. There is no right CVA tenderness, left CVA tenderness, guarding or rebound.  Musculoskeletal:        General: No swelling or tenderness.     Cervical back: Normal range of motion. No rigidity or tenderness.     Right lower leg: No edema.     Left lower leg: No edema.     Comments: Unsteady gait walks with a rolling walker   Lymphadenopathy:     Cervical: No cervical adenopathy.  Skin:    General: Skin is warm and dry.     Coloration: Skin is not pale.     Findings: No bruising, erythema or rash.  Neurological:     Mental Status: He is alert and oriented to person, place, and time.     Cranial Nerves: No cranial nerve deficit.     Motor: No weakness.     Coordination: Coordination normal.     Gait: Gait abnormal.  Psychiatric:        Mood and Affect: Mood normal.        Behavior: Behavior normal.        Thought Content: Thought content normal.        Judgment: Judgment normal.     Labs reviewed: Recent Labs    06/28/19 1021 11/10/19 1218  03/07/20 1032  NA 138 139 139  K 4.4 4.8 4.5  CL 100 100 101  CO2 34* 31 31  GLUCOSE 190* 201* 126*  BUN 22 22 15   CREATININE 1.03 1.03 0.86  CALCIUM 9.8 10.0 9.5   Recent Labs    06/28/19 1021 11/10/19 1218  AST 14 15  ALT 16 16  BILITOT 0.5 0.4  PROT 6.6 6.5   Recent Labs    06/28/19 1021 11/04/19 1001 05/25/20 1229  WBC 6.6 6.0 7.5  NEUTROABS 4,514 3,738 6,143  HGB 14.7 15.2 16.1  HCT 45.6 47.8 49.6  MCV 97.6 98.8 96.1  PLT 192 209 228   Lab Results  Component Value Date   TSH 1.61 06/28/2019   Lab Results  Component Value Date   HGBA1C 7.3 (H) 03/07/2020   Lab Results  Component Value Date   CHOL 199 03/07/2020   HDL 60 03/07/2020   LDLCALC 110 (H) 03/07/2020   TRIG 175 (H) 03/07/2020   CHOLHDL 3.3 03/07/2020    Significant Diagnostic Results in last 30 days:  No results found.  Assessment/Plan   Anterior epistaxis Afebrile. Bright red blood noted on right nare and clotted blood at the back of the throat.His hands and shirt stained with bright blood - packed paper towel removed from nare no bleeding noted.nare packed with 4  X 4 gauze and extra gauze given increased bleeding resume.No bleeding vessels to cauterized with silver nitrite. - advised to pinch nose if bleeding recurred and may apply ice bag to forehead. - CBC with Differential/Platelet  Family/ staff Communication: Reviewed plan of care with patient and POA   Labs/tests ordered: - CBC with Differential/Platelet  Next Appointment: As needed if symptoms worsen or fail to improved.  Sandrea Hughs, NP

## 2020-06-21 ENCOUNTER — Other Ambulatory Visit: Payer: Self-pay | Admitting: Family Medicine

## 2020-06-21 DIAGNOSIS — E1159 Type 2 diabetes mellitus with other circulatory complications: Secondary | ICD-10-CM

## 2020-06-25 ENCOUNTER — Telehealth: Payer: Self-pay

## 2020-06-25 ENCOUNTER — Telehealth: Payer: Self-pay | Admitting: Family Medicine

## 2020-06-25 DIAGNOSIS — R040 Epistaxis: Secondary | ICD-10-CM

## 2020-06-25 MED ORDER — METFORMIN HCL 500 MG PO TABS: 500.0000 mg | ORAL_TABLET | Freq: Two times a day (BID) | ORAL | 1 refills | Status: DC

## 2020-06-25 NOTE — Telephone Encounter (Signed)
Patient requested refill

## 2020-06-25 NOTE — Telephone Encounter (Signed)
ENT referral for nose bleed ordered.

## 2020-06-25 NOTE — Telephone Encounter (Signed)
Pt seen [name] for nose bleed in office a few weeks ago. Since then pt has had 1 nose again & would feel better if they get a referral to ENT to check & see if any other vessels need to be cartized.  Please advise Thanks, Vilinda Blanks

## 2020-07-09 ENCOUNTER — Other Ambulatory Visit: Payer: Medicare PPO

## 2020-07-09 ENCOUNTER — Other Ambulatory Visit: Payer: Self-pay

## 2020-07-09 DIAGNOSIS — E1159 Type 2 diabetes mellitus with other circulatory complications: Secondary | ICD-10-CM

## 2020-07-10 LAB — BASIC METABOLIC PANEL WITH GFR
BUN: 21 mg/dL (ref 7–25)
CO2: 29 mmol/L (ref 20–32)
Calcium: 9.5 mg/dL (ref 8.6–10.3)
Chloride: 101 mmol/L (ref 98–110)
Creat: 0.93 mg/dL (ref 0.70–1.11)
GFR, Est African American: 86 mL/min/{1.73_m2} (ref >=60)
GFR, Est Non African American: 74 mL/min/{1.73_m2} (ref >=60)
Glucose, Bld: 125 mg/dL (ref 65–139)
Potassium: 4.5 mmol/L (ref 3.5–5.3)
Sodium: 141 mmol/L (ref 135–146)

## 2020-07-10 LAB — HEMOGLOBIN A1C
Hgb A1c MFr Bld: 7 % of total Hgb — ABNORMAL HIGH (ref <5.7)
Mean Plasma Glucose: 154 mg/dL
eAG (mmol/L): 8.5 mmol/L

## 2020-07-12 ENCOUNTER — Ambulatory Visit: Payer: Medicare PPO | Admitting: Internal Medicine

## 2020-07-17 ENCOUNTER — Ambulatory Visit (INDEPENDENT_AMBULATORY_CARE_PROVIDER_SITE_OTHER): Payer: Medicare PPO | Admitting: Family Medicine

## 2020-07-17 ENCOUNTER — Other Ambulatory Visit: Payer: Self-pay

## 2020-07-17 ENCOUNTER — Encounter: Payer: Self-pay | Admitting: Family Medicine

## 2020-07-17 VITALS — BP 130/80 | HR 68 | Temp 97.7°F | Resp 20 | Ht 69.0 in | Wt 227.8 lb

## 2020-07-17 DIAGNOSIS — E1159 Type 2 diabetes mellitus with other circulatory complications: Secondary | ICD-10-CM | POA: Diagnosis not present

## 2020-07-17 DIAGNOSIS — Z6835 Body mass index (BMI) 35.0-35.9, adult: Secondary | ICD-10-CM

## 2020-07-17 DIAGNOSIS — I10 Essential (primary) hypertension: Secondary | ICD-10-CM

## 2020-07-17 DIAGNOSIS — I890 Lymphedema, not elsewhere classified: Secondary | ICD-10-CM | POA: Diagnosis not present

## 2020-07-17 NOTE — Patient Instructions (Signed)
Keep legs elevated

## 2020-07-17 NOTE — Progress Notes (Signed)
Provider:  Alain Honey, MD  Careteam: Patient Care Team: [name], MD as PCP - General (Family Medicine) Myrlene Broker, MD as Attending Physician (Urology) Ralene Bathe, MD (Ophthalmology) [name], MD as Referring Physician  PLACE OF SERVICE:  [place] Directive information Does Patient Have a Medical Advance Directive?: Yes (Will bring in copy), Type of Advance Directive: [place], Does patient want to make changes to medical advance directive?: No - Patient declined  Allergies  Allergen Reactions  . Lopressor [Metoprolol Tartrate]     Mental status changes (intolerance)   . Beta Adrenergic Blockers     "they make me depressed"    Chief Complaint  Patient presents with  . Medical Management of Chronic Issues    4 Month Follow Up  . Quality Metric Gaps    Discuss need for Foot Exam, and Urine Microalbumin      HPI: Patient is a 87 y.o. male .Retired [employer]. Lives alone but girl cooks for him. Discussed edema. Worse. Stasis dermatitis.  Has been off diuretic Denies SOB Reviewed recent A1C  7.0 Stable renal function Has eye exam in June  Review of Systems:  Review of Systems  Eyes: Negative.   Respiratory: Negative.   Cardiovascular: Positive for leg swelling.  Gastrointestinal: Negative.   Genitourinary: Negative.   Musculoskeletal: Negative.   Neurological: Negative.   Psychiatric/Behavioral: Negative.   All other systems reviewed and are negative.   Past Medical History:  Diagnosis Date  . Benign prostatic hyperplasia with urinary obstruction   . Bladder neck obstruction 02/02/2012  . Colon polyps 09/01/2011  . Depression   . Diabetes mellitus without complication ([place]) 70/35/0093  . Dyslipidemia associated with type 2 diabetes mellitus ([place])   . Edema    edema in lower legs  . Hyperlipemia   . Hypertension 09/01/2011  . Kyphosis deformity of spine   . Lung disease   .  Melanoma ([place])    on back   Past Surgical History:  Procedure Laterality Date  . HERNIA REPAIR     1960's  . MEDIAL PARTIAL KNEE REPLACEMENT  2015  . melanoma removed from back  2008  . sub mucus  2015   Social History:   reports that he quit smoking about 55 years ago. His smoking use included cigarettes. He quit after 10.00 years of use. He has never used smokeless tobacco. He reports previous alcohol use. He reports that he does not use drugs.  Family History  Problem Relation Age of Onset  . Pancreatic cancer Father   . Stomach cancer Sister   . Hypertension Maternal Grandmother   . Stroke Maternal Grandfather   . Heart disease Paternal Grandmother     Medications: Patient's Medications  New Prescriptions   No medications on file  Previous Medications   ACCU-CHEK GUIDE TEST STRIP       ALBUTEROL (VENTOLIN HFA) 108 (90 BASE) MCG/ACT INHALER    Inhale 1 puff into the lungs as needed for wheezing or shortness of breath.   ASPIRIN 81 MG TABLET    Take 81 mg by mouth daily.   DICLOFENAC SODIUM (VOLTAREN) 1 % GEL    Apply 4 g topically 4 (four) times daily.   FENOFIBRATE (TRICOR) 145 MG TABLET    Take 145 mg by mouth daily.   FINASTERIDE (PROSCAR) 5 MG TABLET    Take 5 mg by mouth daily.   FLUTICASONE (FLONASE) 50 MCG/ACT NASAL SPRAY  Place 1 spray into both nostrils daily.   JARDIANCE 10 MG TABS TABLET    TAKE 1 TABLET BY MOUTH EVERY DAY   MAGNESIUM HYDROXIDE (MILK OF MAGNESIA) 400 MG/5ML SUSPENSION    1/2 capful by mouth every night   METFORMIN (GLUCOPHAGE) 500 MG TABLET    Take 1 tablet (500 mg total) by mouth 2 (two) times daily with a meal.  Modified Medications   No medications on file  Discontinued Medications   No medications on file    Physical Exam:  Vitals:   07/17/20 1326  BP: 130/80  Pulse: 68  Resp: 20  Temp: 97.7 F (36.5 C)  TempSrc: Temporal  SpO2: 95%  Weight: 227 lb 12.8 oz (103.3 kg)  Height: 5\' 9"  (1.753 m)   Body mass index is 33.64  kg/m. Wt Readings from Last 3 Encounters:  07/17/20 227 lb 12.8 oz (103.3 kg)  05/25/20 234 lb (106.1 kg)  03/12/20 234 lb (106.1 kg)    Physical Exam Vitals and nursing note reviewed.  Constitutional:      Appearance: Normal appearance. He is obese.  Cardiovascular:     Rate and Rhythm: Normal rate and regular rhythm.  Pulmonary:     Effort: Pulmonary effort is normal.     Breath sounds: Normal breath sounds.  Skin:    Comments: Stasis derm on lower legs with some weeping  Neurological:     General: No focal deficit present.     Mental Status: He is alert and oriented to person, place, and time.     Comments: Recalls last night's meal but recalls 2 of 3 words at 5 min     Labs reviewed: Basic Metabolic Panel: Recent Labs    11/10/19 1218 03/07/20 1032 07/09/20 1149  NA 139 139 141  K 4.8 4.5 4.5  CL 100 101 101  CO2 31 31 29   GLUCOSE 201* 126* 125  BUN 22 15 21   CREATININE 1.03 0.86 0.93  CALCIUM 10.0 9.5 9.5   Liver Function Tests: Recent Labs    11/10/19 1218  AST 15  ALT 16  BILITOT 0.4  PROT 6.5   No results for input(s): LIPASE, AMYLASE in the last 8760 hours. No results for input(s): AMMONIA in the last 8760 hours. CBC: Recent Labs    11/04/19 1001 05/25/20 1229  WBC 6.0 7.5  NEUTROABS 3,738 6,143  HGB 15.2 16.1  HCT 47.8 49.6  MCV 98.8 96.1  PLT 209 228   Lipid Panel: Recent Labs    03/07/20 1032  CHOL 199  HDL 60  LDLCALC 110*  TRIG 175*  CHOLHDL 3.3   TSH: No results for input(s): TSH in the last 8760 hours. A1C: Lab Results  Component Value Date   HGBA1C 7.0 (H) 07/09/2020     Assessment/Plan  1. Type 2 diabetes mellitus with other circulatory complication, without long-term current use of insulin (HCC) A1C has improved. No change but we did discuss being a little more liberal with diet at his age - Microalbumin, urine  2. Essential hypertension BP good off meds  3. Lymphedema of both lower extremities Continues  to be a problem. Recommend elevation and continue support socks. Will add Lasix back whenever swelling is worse but not more than 3 consecutive days  4. Body mass index (BMI) of 35.0-35.9 in adult Wt loss unlikely. Sedentary life and enjoys food   Alain Honey, MD [facility] [phone]

## 2020-07-19 LAB — MICROALBUMIN, URINE: Microalb, Ur: 0.9 mg/dL

## 2020-09-12 ENCOUNTER — Encounter: Payer: Self-pay | Admitting: *Deleted

## 2020-09-12 LAB — HM DIABETES EYE EXAM

## 2020-10-02 ENCOUNTER — Telehealth: Payer: Self-pay

## 2020-10-02 NOTE — Telephone Encounter (Signed)
Patient and son Brian [name]) was advised per Dr. Sabra Heck an office visit is needed. Appointment was scheduled for tomorrow, 10/03/2020 @ 11:00 AM.

## 2020-10-02 NOTE — Telephone Encounter (Addendum)
Patient's son Brian [name]) called and states patient is having a bad cough with mucus. Brian [name] states he did a at home Covid test and it came back positive for the patient. Patient reports symptoms started on Sunday, 09/30/20. Patient reports taking Nyquil for the symptoms which has not helped. Son wants to know what should patient do and should he have another Covid test done in the office. Please advise.

## 2020-10-03 ENCOUNTER — Other Ambulatory Visit: Payer: Self-pay

## 2020-10-03 ENCOUNTER — Ambulatory Visit (INDEPENDENT_AMBULATORY_CARE_PROVIDER_SITE_OTHER): Payer: Medicare PPO | Admitting: Family Medicine

## 2020-10-03 ENCOUNTER — Encounter: Payer: Self-pay | Admitting: Family Medicine

## 2020-10-03 VITALS — BP 158/92 | HR 94 | Temp 97.3°F

## 2020-10-03 DIAGNOSIS — R059 Cough, unspecified: Secondary | ICD-10-CM

## 2020-10-03 DIAGNOSIS — U071 COVID-19: Secondary | ICD-10-CM | POA: Insufficient documentation

## 2020-10-03 LAB — POCT INFLUENZA A/B
Influenza A, POC: NEGATIVE
Influenza B, POC: NEGATIVE

## 2020-10-03 MED ORDER — NIRMATRELVIR/RITONAVIR (PAXLOVID)TABLET
3.0000 | ORAL_TABLET | Freq: Two times a day (BID) | ORAL | 0 refills | Status: AC
Start: 2020-10-03 — End: 2020-10-08

## 2020-10-03 NOTE — Progress Notes (Signed)
Provider:  Alain Honey, MD  Careteam: Patient Care Team: [name], MD as PCP - General (Family Medicine) Myrlene Broker, MD as Attending Physician (Urology) Ralene Bathe, MD (Ophthalmology) [name], MD as Referring Physician  PLACE OF SERVICE:  [place]  Advanced Directive information    Allergies  Allergen Reactions   Lopressor [Metoprolol Tartrate]     Mental status changes (intolerance)    Beta Adrenergic Blockers     "they make me depressed"    No chief complaint on file.    HPI: Patient is a 87 y.o. male patient presents today with cough.  He had a positive COVID test at home yesterday.  He was asked to come in for a visit.  Due to his age and comorbidities he is at high risk.  He has been immunized and boosted x1. He has not been febrile.  Cough is mostly dry.  He denies shortness of breath.  He denies loss of taste or smell.  Review of Systems:  Review of Systems  Respiratory:  Positive for cough.   All other systems reviewed and are negative.  Past Medical History:  Diagnosis Date   Benign prostatic hyperplasia with urinary obstruction    Bladder neck obstruction 02/02/2012   Colon polyps 09/01/2011   Depression    Diabetes mellitus without complication ([place]) 18/29/9371   Dyslipidemia associated with type 2 diabetes mellitus ([place])    Edema    edema in lower legs   Hyperlipemia    Hypertension 09/01/2011   Kyphosis deformity of spine    Lung disease    Melanoma ([place])    on back   Past Surgical History:  Procedure Laterality Date   HERNIA REPAIR     1960's   MEDIAL PARTIAL KNEE REPLACEMENT  2015   melanoma removed from back  2008   sub mucus  2015   Social History:   reports that he quit smoking about 55 years ago. His smoking use included cigarettes. He has never used smokeless tobacco. He reports previous alcohol use. He reports that he does not use drugs.  Family History  Problem Relation Age of Onset    Pancreatic cancer Father    Stomach cancer Sister    Hypertension Maternal Grandmother    Stroke Maternal Grandfather    Heart disease Paternal Grandmother     Medications: Patient's Medications  New Prescriptions   No medications on file  Previous Medications   ACCU-CHEK GUIDE TEST STRIP       ALBUTEROL (VENTOLIN HFA) 108 (90 BASE) MCG/ACT INHALER    Inhale 1 puff into the lungs as needed for wheezing or shortness of breath.   ASPIRIN 81 MG TABLET    Take 81 mg by mouth daily.   DICLOFENAC SODIUM (VOLTAREN) 1 % GEL    Apply 4 g topically 4 (four) times daily.   FENOFIBRATE (TRICOR) 145 MG TABLET    Take 145 mg by mouth daily.   FINASTERIDE (PROSCAR) 5 MG TABLET    Take 5 mg by mouth daily.   FLUTICASONE (FLONASE) 50 MCG/ACT NASAL SPRAY    Place 1 spray into both nostrils daily.   JARDIANCE 10 MG TABS TABLET    TAKE 1 TABLET BY MOUTH EVERY DAY   MAGNESIUM HYDROXIDE (MILK OF MAGNESIA) 400 MG/5ML SUSPENSION    1/2 capful by mouth every night   METFORMIN (GLUCOPHAGE) 500 MG TABLET    Take 1 tablet (500 mg total) by mouth 2 (two)  times daily with a meal.  Modified Medications   No medications on file  Discontinued Medications   No medications on file    Physical Exam:  There were no vitals filed for this visit. There is no height or weight on file to calculate BMI. Wt Readings from Last 3 Encounters:  07/17/20 227 lb 12.8 oz (103.3 kg)  05/25/20 234 lb (106.1 kg)  03/12/20 234 lb (106.1 kg)    Physical Exam Vitals and nursing note reviewed.  Constitutional:      Appearance: Normal appearance.  Cardiovascular:     Rate and Rhythm: Normal rate and regular rhythm.  Pulmonary:     Effort: Pulmonary effort is normal.     Breath sounds: Normal breath sounds.  Musculoskeletal:     Right lower leg: Edema present.     Left lower leg: Edema present.  Skin:    Comments: Continues with stasis dermatitis of both lower extremities  Neurological:     General: No focal deficit  present.     Mental Status: He is alert and oriented to person, place, and time.  Psychiatric:        Mood and Affect: Mood normal.        Behavior: Behavior normal.        Thought Content: Thought content normal.    Labs reviewed: Basic Metabolic Panel: Recent Labs    11/10/19 1218 03/07/20 1032 07/09/20 1149  NA 139 139 141  K 4.8 4.5 4.5  CL 100 101 101  CO2 31 31 29   GLUCOSE 201* 126* 125  BUN 22 15 21   CREATININE 1.03 0.86 0.93  CALCIUM 10.0 9.5 9.5   Liver Function Tests: Recent Labs    11/10/19 1218  AST 15  ALT 16  BILITOT 0.4  PROT 6.5   No results for input(s): LIPASE, AMYLASE in the last 8760 hours. No results for input(s): AMMONIA in the last 8760 hours. CBC: Recent Labs    11/04/19 1001 05/25/20 1229  WBC 6.0 7.5  NEUTROABS 3,738 6,143  HGB 15.2 16.1  HCT 47.8 49.6  MCV 98.8 96.1  PLT 209 228   Lipid Panel: Recent Labs    03/07/20 1032  CHOL 199  HDL 60  LDLCALC 110*  TRIG 175*  CHOLHDL 3.3   TSH: No results for input(s): TSH in the last 8760 hours. A1C: Lab Results  Component Value Date   HGBA1C 7.0 (H) 07/09/2020     Assessment/Plan  1. Cough Will submit PCR.  Flu test was negative. - SARS-COV-2 RNA,(COVID-19) QUAL NAAT - POCT Influenza A/B  2. COVID-19 Given high risk will treat with Paxil of it.  Renal function is acceptable.   Alain Honey, MD [facility] [phone]

## 2020-10-04 ENCOUNTER — Other Ambulatory Visit (HOSPITAL_COMMUNITY): Payer: Self-pay | Admitting: Family Medicine

## 2020-10-05 LAB — SARS-COV-2 RNA,(COVID-19) QUALITATIVE NAAT: SARS CoV2 RNA: DETECTED — AB

## 2020-10-15 ENCOUNTER — Other Ambulatory Visit: Payer: Self-pay | Admitting: Family Medicine

## 2020-11-12 ENCOUNTER — Other Ambulatory Visit: Payer: Self-pay | Admitting: *Deleted

## 2020-11-12 MED ORDER — EMPAGLIFLOZIN 10 MG PO TABS: 10.0000 mg | ORAL_TABLET | Freq: Every day | ORAL | 1 refills | Status: DC

## 2020-11-12 NOTE — Telephone Encounter (Signed)
Pharmacy requested refill

## 2020-11-20 ENCOUNTER — Encounter: Payer: Self-pay | Admitting: Family Medicine

## 2020-11-20 ENCOUNTER — Ambulatory Visit (INDEPENDENT_AMBULATORY_CARE_PROVIDER_SITE_OTHER): Payer: Medicare PPO | Admitting: Family Medicine

## 2020-11-20 ENCOUNTER — Other Ambulatory Visit: Payer: Self-pay

## 2020-11-20 VITALS — BP 142/90 | HR 82 | Temp 97.5°F | Ht 69.0 in | Wt 228.0 lb

## 2020-11-20 DIAGNOSIS — I10 Essential (primary) hypertension: Secondary | ICD-10-CM

## 2020-11-20 DIAGNOSIS — I890 Lymphedema, not elsewhere classified: Secondary | ICD-10-CM | POA: Diagnosis not present

## 2020-11-20 DIAGNOSIS — E1159 Type 2 diabetes mellitus with other circulatory complications: Secondary | ICD-10-CM

## 2020-11-20 DIAGNOSIS — E119 Type 2 diabetes mellitus without complications: Secondary | ICD-10-CM

## 2020-11-20 DIAGNOSIS — Z6835 Body mass index (BMI) 35.0-35.9, adult: Secondary | ICD-10-CM

## 2020-11-20 DIAGNOSIS — I872 Venous insufficiency (chronic) (peripheral): Secondary | ICD-10-CM

## 2020-11-20 MED ORDER — FUROSEMIDE 40 MG PO TABS: 40.0000 mg | ORAL_TABLET | Freq: Every day | ORAL | 3 refills | Status: DC

## 2020-11-20 MED ORDER — TRIAMCINOLONE ACETONIDE 0.1 % EX CREA: 1.0000 "application " | TOPICAL_CREAM | Freq: Two times a day (BID) | CUTANEOUS | 0 refills | Status: DC

## 2020-11-20 NOTE — Patient Instructions (Signed)
Potassium Content of Foods  Potassium is a mineral found in many foods and drinks. It affects how the heartworks, and helps keep fluids and minerals balanced in the body. The amount of potassium you need each day depends on your age and any medical conditions you may have. Talk to your health care provider or dietitian abouthow much potassium you need. The following lists of foods provide the general serving size for foods and the approximate amount of potassium in each serving, listed in milligrams (mg).Actual values may vary depending on the product and how it is processed. High in potassium The following foods and beverages have 200 mg or more of potassium per serving: Apricots (raw) - 2 have 200 mg of potassium. Apricots (dry) - 5 have 200 mg of potassium. Artichoke - 1 medium has 345 mg of potassium. Avocado -  fruit has 245 mg of potassium. Banana - 1 medium fruit has 425 mg of potassium. [place] or baked beans (canned) -  cup has 280 mg of potassium. White beans (canned) -  cup has 595 mg potassium. Beef roast - 3 oz has 320 mg of potassium. Ground beef - 3 oz has 270 mg of potassium. Beets (raw or cooked) -  cup has 260 mg of potassium. Bran muffin - 2 oz has 300 mg of potassium. Broccoli (cooked) -  cup has 230 mg of potassium. Brussels sprouts -  cup has 250 mg of potassium. Cantaloupe -  cup has 215 mg of potassium. Cereal, 100% bran -  cup has 200-400 mg of potassium. Cheeseburger -1 single fast food burger has 225-400 mg of potassium. Chicken - 3 oz has 220 mg of potassium. Clams (canned) - 3 oz has 535 mg of potassium. Crab - 3 oz has 225 mg of potassium. Dates - 5 have 270 mg of potassium. Dried beans and peas -  cup has 300-475 mg of potassium. Figs (dried) - 2 have 260 mg of potassium. Fish (halibut, tuna, cod, snapper) - 3 oz has 480 mg of potassium. Fish (salmon, haddock, swordfish, perch) - 3 oz has 300 mg of potassium. Fish (tuna, canned) - 3 oz has 200 mg of  potassium. [place] fries (fast food) - 3 oz has 470 mg of potassium. Granola with fruit and nuts -  cup has 200 mg of potassium. Grapefruit juice -  cup has 200 mg of potassium. Honeydew melon -  cup has 200 mg of potassium. Kale (raw) - 1 cup has 300 mg of potassium. Kiwi - 1 medium fruit has 240 mg of potassium. Kohlrabi, rutabaga, parsnips -  cup has 280 mg of potassium. Lentils -  cup has 365 mg of potassium. Mango - 1 each has 325 mg of potassium. Milk (nonfat, low-fat, whole, buttermilk) - 1 cup has 350-380 mg of potassium. Milk (chocolate) - 1 cup has 420 mg of potassium Molasses - 1 Tbsp has 295 mg of potassium. Mushrooms -  cup has 280 mg of potassium. Nectarine - 1 each has 275 mg of potassium. Nuts (almonds, peanuts, hazelnuts, [place], cashew, mixed) - 1 oz has 200 mg of potassium. Nuts (pistachios) - 1 oz has 295 mg of potassium. Orange - 1 fruit has 240 mg of potassium. Orange juice -  cup has 235 mg of potassium. Papaya -  medium fruit has 390 mg of potassium. Peanut butter (chunky) - 2 Tbsp has 240 mg of potassium. Peanut butter (smooth) - 2 Tbsp has 210 mg of potassium. Pear - 1 medium (200 mg) of  potassium. Pomegranate - 1 whole fruit has 400 mg of potassium. Pomegranate juice -  cup has 215 mg of potassium. Pork - 3 oz has 350 mg of potassium. Potato chips (salted) - 1 oz has 465 mg of potassium. Potato (baked with skin) - 1 medium has 925 mg of potassium. Potato (boiled) -  cup has 255 mg of potassium. Potato (Mashed) -  cup has 330 mg of potassium. Prune juice -  cup has 370 mg of potassium. Prunes - 5 have 305 mg of potassium. Pudding (chocolate) -  cup has 230 mg of potassium. Pumpkin (canned) -  cup has 250 mg of potassium. Raisins (seedless) -  cup has 270 mg of potassium. Seeds (sunflower or pumpkin) - 1 oz has 240 mg of potassium. Soy milk - 1 cup has 300 mg of potassium. Spinach (cooked) - 1/2 cup has 420 mg of potassium. Spinach (canned)  -  cup has 370 mg of potassium. Sweet potato (baked with skin) - 1 medium has 450 mg of potassium. Swiss chard -  cup has 480 mg of potassium. Tomato or vegetable juice -  cup has 275 mg of potassium. Tomato (sauce or puree) -  cup has 400-550 mg of potassium. Tomato (raw) - 1 medium has 290 mg of potassium. Tomato (canned) -  cup has 200-300 mg of potassium. [place] - 3 oz has 250 mg of potassium. Wheat germ - 1 oz has 250 mg of potassium. Winter squash -  cup has 250 mg of potassium. Yogurt (plain or fruited) - 6 oz has 260-435 mg of potassium. Zucchini -  cup has 220 mg of potassium. Moderate in potassium The following foods and beverages have 50-200 mg of potassium per serving: Apple - 1 fruit has 150 mg of potassium Apple juice -  cup has 150 mg of potassium Applesauce -  cup has 90 mg of potassium Apricot nectar -  cup has 140 mg of potassium Asparagus (small spears) -  cup has 155 mg of potassium Asparagus (large spears) - 6 have 155 mg of potassium Bagel (cinnamon raisin) - 1 four-inch bagel has 130 mg of potassium Bagel (egg or plain) - 1 four- inch bagel has 70 mg of potassium Beans (green) -  cup has 90 mg of potassium Beans (yellow) -  cup has 190 mg of potassium Beer, regular - 12 oz has 100 mg of potassium Beets (canned) -  cup has 125 mg of potassium Blackberries -  cup has 115 mg of potassium Blueberries -  cup has 60 mg of potassium Bread (whole wheat) - 1 slice has 70 mg of potassium Broccoli (raw) -  cup has 145 mg of potassium Cabbage -  cup has 150 mg of potassium Carrots (cooked or raw) -  cup has 180 mg of potassium Cauliflower (raw) -  cup has 150 mg of potassium Celery (raw) -  cup has 155 mg of potassium Cereal, bran flakes -  cup has 120-150 mg of potassium Cheese (cottage) -  cup has 110 mg of potassium Cherries - 10 have 150 mg of potassium Chocolate - 1 oz bar has 165 mg of potassium Coffee (brewed) - 6 oz has 90 mg of  potassium Corn -  cup or 1 ear has 195 mg of potassium Cucumbers -  cup has 80 mg of potassium Egg - 1 large egg has 60 mg of potassium Eggplant -  cup has 60 mg of potassium Endive (raw) -  cup has 80 mg of  potassium English muffin - 1 has 65 mg of potassium Fish (ocean perch) - 3 oz has 192 mg of potassium Frankfurter, beef or pork - 1 has 75 mg of potassium Fruit cocktail -  cup has 115 mg of potassium Grape juice -  cup has 170 mg of potassium Grapefruit -  fruit has 175 mg of potassium Grapes -  cup has 155 mg of potassium Greens: kale, turnip, collard -  cup has 110-150 mg of potassium Ice cream or frozen yogurt (chocolate) -  cup has 175 mg of potassium Ice cream or frozen yogurt (vanilla) -  cup has 120-150 mg of potassium Lemons, limes - 1 each has 80 mg of potassium Lettuce - 1 cup has 100 mg of potassium Mixed vegetables -  cup has 150 mg of potassium Mushrooms, raw -  cup has 110 mg of potassium Nuts (walnuts, pecans, or macadamia) - 1 oz has 125 mg of potassium Oatmeal -  cup has 80 mg of potassium Okra -  cup has 110 mg of potassium Onions -  cup has 120 mg of potassium Peach - 1 has 185 mg of potassium Peaches (canned) -  cup has 120 mg of potassium Pears (canned) -  cup has 120 mg of potassium Peas, green (frozen) -  cup has 90 mg of potassium Peppers (Green) -  cup has 130 mg of potassium Peppers (Red) -  cup has 160 mg of potassium Pineapple juice -  cup has 165 mg of potassium Pineapple (fresh or canned) -  cup has 100 mg of potassium Plums - 1 has 105 mg of potassium Pudding, vanilla -  cup has 150 mg of potassium Raspberries -  cup has 90 mg of potassium Rhubarb -  cup has 115 mg of potassium Rice, wild -  cup has 80 mg of potassium Shrimp - 3 oz has 155 mg of potassium Spinach (raw) - 1 cup has 170 mg of potassium Strawberries -  cup has 125 mg of potassium Summer squash -  cup has 175-200 mg of potassium Swiss chard (raw) -  1 cup has 135 mg of potassium Tangerines - 1 fruit has 140 mg of potassium Tea, brewed - 6 oz has 65 mg of potassium Turnips -  cup has 140 mg of potassium Watermelon -  cup has 85 mg of potassium Wine (Red, table) - 5 oz has 180 mg of potassium Wine (White, table) - 5 oz 100 mg of potassium Low in potassium The following foods and beverages have less than 50 mg of potassium per serving. Bread (white) - 1 slice has 30 mg of potassium Carbonated beverages - 12 oz has less than 5 mg of potassium Cheese - 1 oz has 20-30 mg of potassium Cranberries -  cup has 45 mg of potassium Cranberry juice cocktail -  cup has 20 mg of potassium Fats and oils - 1 Tbsp has less than 5 mg of potassium Hummus - 1 Tbsp has 32 mg of potassium Nectar (papaya, mango, or pear) -  cup has 35 mg of potassium Rice (white or brown) -  cup has 50 mg of potassium Spaghetti or macaroni (cooked) -  cup has 30 mg of potassium Tortilla, flour or corn - 1 has 50 mg of potassium Waffle - 1 four-inch waffle has 50 mg of potassium Water chestnuts -  cup has 40 mg of potassium Summary Potassium is a mineral found in many foods and drinks. It affects how the heart works, and  helps keep fluids and minerals balanced in the body. The amount of potassium you need each day depends on your age and any existing medical conditions you may have. Your health care provider or dietitian may recommend an amount of potassium that you should have each day. This information is not intended to replace advice given to you by your health care provider. Make sure you discuss any questions you have with your healthcare provider. Document Revised: 02/20/2017 Document Reviewed: 06/04/2016 Elsevier Patient Education  [place].

## 2020-11-20 NOTE — Progress Notes (Signed)
Provider:  Alain Honey, MD  Careteam: Patient Care Team: [name], MD as PCP - General (Family Medicine) Myrlene Broker, MD as Attending Physician (Urology) Ralene Bathe, MD (Ophthalmology) [name], MD as Referring Physician  PLACE OF SERVICE:  [place]  Advanced Directive information    Allergies  Allergen Reactions   Lopressor [Metoprolol Tartrate]     Mental status changes (intolerance)    Beta Adrenergic Blockers     "they make me depressed"    Chief Complaint  Patient presents with   Medical Management of Chronic Issues    Patient presents today for a 4 month follow-up.   Quality Metric Gaps    Zoster, Covid and Flu vaccines.     HPI: Patient is a 87 y.o. Brian [name] .  He was last seen with cough and positive COVID test.  He reports full recovery from that. He has been using Lasix 3 days a week.  That has helped dependent swelling but he still has stasis dermatitis.  He does report some decrease in energy levels and we need to monitor potassium on Lasix. Also reports some daytime somnolence.  Says he sleeps well at night.  Possibility of sleep apnea is real but I do not think he would be a candidate to begin CPAP at his age.  Review of Systems:  Review of Systems  Cardiovascular:  Positive for leg swelling.  All other systems reviewed and are negative.  Past Medical History:  Diagnosis Date   Benign prostatic hyperplasia with urinary obstruction    Bladder neck obstruction 02/02/2012   Colon polyps 09/01/2011   Depression    Diabetes mellitus without complication ([place]) 24/46/2863   Dyslipidemia associated with type 2 diabetes mellitus ([place])    Edema    edema in lower legs   Hyperlipemia    Hypertension 09/01/2011   Kyphosis deformity of spine    Lung disease    Melanoma ([place])    on back   Past Surgical History:  Procedure Laterality Date   HERNIA REPAIR     1960's   MEDIAL PARTIAL KNEE REPLACEMENT  2015   melanoma  removed from back  2008   sub mucus  2015   Social History:   reports that he quit smoking about 55 years ago. His smoking use included cigarettes. He has never used smokeless tobacco. He reports that he does not currently use alcohol. He reports that he does not use drugs.  Family History  Problem Relation Age of Onset   Pancreatic cancer Father    Stomach cancer Sister    Hypertension Maternal Grandmother    Stroke Maternal Grandfather    Heart disease Paternal Grandmother     Medications: Patient's Medications  New Prescriptions   FUROSEMIDE (LASIX) 40 MG TABLET    Take 1 tablet (40 mg total) by mouth daily.   TRIAMCINOLONE CREAM (KENALOG) 0.1 %    Apply 1 application topically 2 (two) times daily.  Previous Medications   ACCU-CHEK GUIDE TEST STRIP       ALBUTEROL (VENTOLIN HFA) 108 (90 BASE) MCG/ACT INHALER    Inhale 1 puff into the lungs as needed for wheezing or shortness of breath.   ASPIRIN 81 MG TABLET    Take 81 mg by mouth daily.   DICLOFENAC SODIUM (VOLTAREN) 1 % GEL    Apply 4 g topically 4 (four) times daily.   EMPAGLIFLOZIN (JARDIANCE) 10 MG TABS TABLET    Take 1 tablet (10  mg total) by mouth daily.   FENOFIBRATE (TRICOR) 145 MG TABLET    Take 145 mg by mouth daily.   FINASTERIDE (PROSCAR) 5 MG TABLET    Take 5 mg by mouth daily.   FLUTICASONE (FLONASE) 50 MCG/ACT NASAL SPRAY    Place 1 spray into both nostrils daily.   MAGNESIUM HYDROXIDE (MILK OF MAGNESIA) 400 MG/5ML SUSPENSION    1/2 capful by mouth every night   METFORMIN (GLUCOPHAGE) 500 MG TABLET    TAKE 1 TABLET BY MOUTH 2 TIMES DAILY WITH A MEAL.  Modified Medications   No medications on file  Discontinued Medications   No medications on file    Physical Exam:  Vitals:   11/20/20 1414  BP: (!) 142/90  Pulse: 82  Temp: (!) 97.5 F (36.4 C)  TempSrc: Temporal  SpO2: 96%  Weight: 228 lb (103.4 kg)  Height: _0  (1.753 m)   Body mass index is 33.67 kg/m. Wt Readings from Last 3 Encounters:   11/20/20 228 lb (103.4 kg)  07/17/20 227 lb 12.8 oz (103.3 kg)  05/25/20 234 lb (106.1 kg)    Physical Exam Vitals and nursing note reviewed.  Constitutional:      Appearance: Normal appearance.  Cardiovascular:     Rate and Rhythm: Normal rate and regular rhythm.  Pulmonary:     Effort: Pulmonary effort is normal.     Breath sounds: Normal breath sounds.  Musculoskeletal:        General: Swelling present.     Comments: 2+ edema right greater than left.  Suspect venous insufficiency and secondary stasis dermatitis  Skin:    Comments: He has 2 lesions on his left arm but also has a upcoming appointment with dermatologist and will defer to the specialist  Neurological:     General: No focal deficit present.     Mental Status: He is alert and oriented to person, place, and time.    Labs reviewed: Basic Metabolic Panel: Recent Labs    03/07/20 1032 07/09/20 1149  NA 139 141  K 4.5 4.5  CL 101 101  CO2 31 29  GLUCOSE 126* 125  BUN 15 21  CREATININE 0.86 0.93  CALCIUM 9.5 9.5   Liver Function Tests: No results for input(s): AST, ALT, ALKPHOS, BILITOT, PROT, ALBUMIN in the last 8760 hours. No results for input(s): LIPASE, AMYLASE in the last 8760 hours. No results for input(s): AMMONIA in the last 8760 hours. CBC: Recent Labs    05/25/20 1229  WBC 7.5  NEUTROABS 6,143  HGB 16.1  HCT 49.6  MCV 96.1  PLT 228   Lipid Panel: Recent Labs    03/07/20 1032  CHOL 199  HDL 60  LDLCALC 110*  TRIG 175*  CHOLHDL 3.3   TSH: No results for input(s): TSH in the last 8760 hours. A1C: Lab Results  Component Value Date   HGBA1C 7.0 (H) 07/09/2020     Assessment/Plan  1. Lymphedema of both lower extremities Continue with Lasix.  Will monitor potassium - furosemide (LASIX) 40 MG tablet; Take 1 tablet (40 mg total) by mouth daily.  Dispense: 30 tablet; Refill: 3  2. Stasis dermatitis of both legs I think if we can treat the edema that the stasis dermatitis will  resolve.  Did give him some triamcinolone lotion - triamcinolone cream (KENALOG) 0.1 %; Apply 1 application topically 2 (two) times daily.  Dispense: 30 g; Refill: 0  3. Chronic venous insufficiency Continue salt restriction with elevation and support  stockings - BMP with eGFR([facility])  4. Type 2 diabetes mellitus with other circulatory complication, without long-term current use of insulin (HCC) Last A1c was 7.0.  This is more than adequate in this elderly gentleman - Hemoglobin A1c  5. Class 2 severe obesity due to excess calories with serious comorbidity and body mass index (BMI) of 35.0 to 35.9 in adult (HCC) BMI has decreased.  Continue to watch carb intake  6. Diabetes mellitus without complication (HCC) Medications include Jardiance and metformin and A1c is therapeutic  7. Essential hypertension Blood pressure is good at 142/90 given his age.  Continue enalapril at current dose   Alain Honey, MD [place] [phone]

## 2020-11-21 LAB — HEMOGLOBIN A1C
Hgb A1c MFr Bld: 7.1 % of total Hgb — ABNORMAL HIGH (ref <5.7)
Mean Plasma Glucose: 157 mg/dL
eAG (mmol/L): 8.7 mmol/L

## 2020-11-21 LAB — BASIC METABOLIC PANEL WITH GFR
BUN: 20 mg/dL (ref 7–25)
CO2: 31 mmol/L (ref 20–32)
Calcium: 9.7 mg/dL (ref 8.6–10.3)
Chloride: 102 mmol/L (ref 98–110)
Creat: 0.81 mg/dL (ref 0.70–1.22)
Glucose, Bld: 91 mg/dL (ref 65–99)
Potassium: 4.4 mmol/L (ref 3.5–5.3)
Sodium: 139 mmol/L (ref 135–146)
eGFR: 86 mL/min/{1.73_m2} (ref >=60)

## 2020-12-04 ENCOUNTER — Ambulatory Visit (INDEPENDENT_AMBULATORY_CARE_PROVIDER_SITE_OTHER): Payer: Medicare PPO | Admitting: Family

## 2020-12-04 ENCOUNTER — Encounter: Payer: Self-pay | Admitting: Family

## 2020-12-04 ENCOUNTER — Other Ambulatory Visit: Payer: Self-pay

## 2020-12-04 DIAGNOSIS — Z0000 Encounter for general adult medical examination without abnormal findings: Secondary | ICD-10-CM

## 2020-12-04 NOTE — Patient Instructions (Signed)
Brian [name] , Thank you for taking time to come for your Medicare Wellness Visit. I appreciate your ongoing commitment to your health goals. Please review the following plan we discussed and let me know if I can assist you in the future.   Screening recommendations/referrals: Colonoscopy N/A  Recommended yearly ophthalmology/optometry visit for glaucoma screening and checkup Recommended yearly dental visit for hygiene and checkup  Vaccinations: Influenza vaccine : Due  Pneumococcal vaccine:Up to date  Tdap vaccine :Up to date  Shingles vaccine Due please get vaccine at your pharmacy    Advanced directives: Yes   Conditions/risks identified: Advance age men > 65 yrs,male Gender,Hypertension,dyslipidemia and BMI > 30   Next appointment: 1 year   Preventive Care 85 Years and Older, Male Preventive care refers to lifestyle choices and visits with your health care provider that can promote health and wellness. What does preventive care include? A yearly physical exam. This is also called an annual well check. Dental exams once or twice a year. Routine eye exams. Ask your health care provider how often you should have your eyes checked. Personal lifestyle choices, including: Daily care of your teeth and gums. Regular physical activity. Eating a healthy diet. Avoiding tobacco and drug use. Limiting alcohol use. Practicing safe sex. Taking low doses of aspirin every day. Taking vitamin and mineral supplements as recommended by your health care provider. What happens during an annual well check? The services and screenings done by your health care provider during your annual well check will depend on your age, overall health, lifestyle risk factors, and family history of disease. Counseling  Your health care provider may ask you questions about your: Alcohol use. Tobacco use. Drug use. Emotional well-being. Home and relationship well-being. Sexual activity. Eating habits. History  of falls. Memory and ability to understand (cognition). Work and work Statistician. Screening  You may have the following tests or measurements: Height, weight, and BMI. Blood pressure. Lipid and cholesterol levels. These may be checked every 5 years, or more frequently if you are over 15 years old. Skin check. Lung cancer screening. You may have this screening every year starting at age 45 if you have a 30-pack-year history of smoking and currently smoke or have quit within the past 15 years. Fecal occult blood test (FOBT) of the stool. You may have this test every year starting at age 85. Flexible sigmoidoscopy or colonoscopy. You may have a sigmoidoscopy every 5 years or a colonoscopy every 10 years starting at age 85. Prostate cancer screening. Recommendations will vary depending on your family history and other risks. Hepatitis C blood test. Hepatitis B blood test. Sexually transmitted disease (STD) testing. Diabetes screening. This is done by checking your blood sugar (glucose) after you have not eaten for a while (fasting). You may have this done every 1-3 years. Abdominal aortic aneurysm (AAA) screening. You may need this if you are a current or former smoker. Osteoporosis. You may be screened starting at age 85 if you are at high risk. if you are at high risk. Talk with your health care provider about your test results, treatment options, and if necessary, the need for more tests. Vaccines  Your health care provider may recommend certain vaccines, such as: Influenza vaccine. This is recommended every year. Tetanus, diphtheria, and acellular pertussis (Tdap, Td) vaccine. You may need a Td booster every 10 years. Zoster vaccine. You may need this after age 85. Pneumococcal 13-valent conjugate (PCV13) vaccine. One dose is recommended after age 85. Pneumococcal polysaccharide (PPSV23) vaccine. One dose is  recommended after age 85. Talk to your health care provider about which screenings and vaccines you need  and how often you need them. This information is not intended to replace advice given to you by your health care provider. Make sure you discuss any questions you have with your health care provider. Document Released: 04/06/2015 Document Revised: 11/28/2015 Document Reviewed: 01/09/2015 Elsevier Interactive Patient Education  2017 [place] Prevention in the Home Falls can cause injuries. They can happen to people of all ages. There are many things you can do to make your home safe and to help prevent falls. What can I do on the outside of my home? Regularly fix the edges of walkways and driveways and fix any cracks. Remove anything that might make you trip as you walk through a door, such as a raised step or threshold. Trim any bushes or trees on the path to your home. Use bright outdoor lighting. Clear any walking paths of anything that might make someone trip, such as rocks or tools. Regularly check to see if handrails are loose or broken. Make sure that both sides of any steps have handrails. Any raised decks and porches should have guardrails on the edges. Have any leaves, snow, or ice cleared regularly. Use sand or salt on walking paths during winter. Clean up any spills in your garage right away. This includes oil or grease spills. What can I do in the bathroom? Use night lights. Install grab bars by the toilet and in the tub and shower. Do not use towel bars as grab bars. Use non-skid mats or decals in the tub or shower. If you need to sit down in the shower, use a plastic, non-slip stool. Keep the floor dry. Clean up any water that spills on the floor as soon as it happens. Remove soap buildup in the tub or shower regularly. Attach bath mats securely with double-sided non-slip rug tape. Do not have throw rugs and other things on the floor that can make you trip. What can I do in the bedroom? Use night lights. Make sure that you have a light by your bed that is easy  to reach. Do not use any sheets or blankets that are too big for your bed. They should not hang down onto the floor. Have a firm chair that has side arms. You can use this for support while you get dressed. Do not have throw rugs and other things on the floor that can make you trip. What can I do in the kitchen? Clean up any spills right away. Avoid walking on wet floors. Keep items that you use a lot in easy-to-reach places. If you need to reach something above you, use a strong step stool that has a grab bar. Keep electrical cords out of the way. Do not use floor polish or wax that makes floors slippery. If you must use wax, use non-skid floor wax. Do not have throw rugs and other things on the floor that can make you trip. What can I do with my stairs? Do not leave any items on the stairs. Make sure that there are handrails on both sides of the stairs and use them. Fix handrails that are broken or loose. Make sure that handrails are as long as the stairways. Check any carpeting to make sure that it is firmly attached to the stairs. Fix any carpet that is loose or worn. Avoid having throw rugs at the top or bottom of the stairs. If  you do have throw rugs, attach them to the floor with carpet tape. Make sure that you have a light switch at the top of the stairs and the bottom of the stairs. If you do not have them, ask someone to add them for you. What else can I do to help prevent falls? Wear shoes that: Do not have high heels. Have rubber bottoms. Are comfortable and fit you well. Are closed at the toe. Do not wear sandals. If you use a stepladder: Make sure that it is fully opened. Do not climb a closed stepladder. Make sure that both sides of the stepladder are locked into place. Ask someone to hold it for you, if possible. Clearly mark and make sure that you can see: Any grab bars or handrails. First and last steps. Where the edge of each step is. Use tools that help you move  around (mobility aids) if they are needed. These include: Canes. Walkers. Scooters. Crutches. Turn on the lights when you go into a dark area. Replace any light bulbs as soon as they burn out. Set up your furniture so you have a clear path. Avoid moving your furniture around. If any of your floors are uneven, fix them. If there are any pets around you, be aware of where they are. Review your medicines with your doctor. Some medicines can make you feel dizzy. This can increase your chance of falling. Ask your doctor what other things that you can do to help prevent falls. This information is not intended to replace advice given to you by your health care provider. Make sure you discuss any questions you have with your health care provider. Document Released: 01/04/2009 Document Revised: 08/16/2015 Document Reviewed: 04/14/2014 Elsevier Interactive Patient Education  2017 [employer].

## 2020-12-04 NOTE — Progress Notes (Signed)
  This service is provided via telemedicine  No vital signs collected/recorded due to the encounter was a telemedicine visit.   Location of patient (ex: home, work):  Home.  Patient consents to a telephone visit:  Yes  Location of the provider (ex: office, home):  [facility].  Name of any referring provider:  Miller, Stephen M, MD   Names of all persons participating in the telemedicine service and their role in the encounter:  Patient, Brian [name], RMA, Ngetich, [name], NP.    Time spent on call: 8 minutes spent on the phone with Medical Assistant.   

## 2020-12-04 NOTE — Progress Notes (Signed)
Subjective:   Brian [name] is a 87 y.o. male who presents for Medicare Annual/Subsequent preventive examination.  Review of Systems     Cardiac Risk Factors include: advanced age (>62mn, >>18women);male gender;hypertension;dyslipidemia;obesity (BMI >30kg/m2)     Objective:    There were no vitals filed for this visit. There is no height or weight on file to calculate BMI.  Advanced Directives 12/04/2020 07/17/2020 05/25/2020 03/12/2020 12/22/2019 11/10/2019 08/04/2019  Does Patient Have a Medical Advance Directive? Yes Yes Yes Yes Yes Yes Yes  Type of AProgrammer, systemsOut of facility DNR (pink MOST or yellow form);HPersonal assistantPower of AWheatcroftof AThunderbird BayLiving will  Does patient want to make changes to medical advance directive? No - Patient declined No - Patient declined No - Patient declined No - Patient declined No - Patient declined No - Patient declined No - Patient declined  Copy of HNewtonin Chart? Yes - validated most recent copy scanned in chart (See row information) No - copy requested Yes - validated most recent copy scanned in chart (See row information) - - - No - copy requested  Would patient like information on creating a medical advance directive? - - - - - - -    Current Medications (verified) Outpatient Encounter Medications as of 12/04/2020  Medication Sig   ACCU-CHEK GUIDE test strip    albuterol (VENTOLIN HFA) 108 (90 Base) MCG/ACT inhaler Inhale 1 puff into the lungs as needed for wheezing or shortness of breath.   aspirin 81 MG tablet Take 81 mg by mouth daily.   diclofenac Sodium (VOLTAREN) 1 % GEL Apply 4 g topically 4 (four) times daily.   empagliflozin (JARDIANCE) 10 MG TABS tablet Take 1 tablet (10 mg total) by mouth daily.   finasteride (PROSCAR) 5 MG tablet Take 5 mg by mouth  daily.   fluticasone (FLONASE) 50 MCG/ACT nasal spray Place 1 spray into both nostrils daily.   furosemide (LASIX) 40 MG tablet Take 1 tablet (40 mg total) by mouth daily.   magnesium hydroxide (MILK OF MAGNESIA) 400 MG/5ML suspension 1/2 capful by mouth every night   metFORMIN (GLUCOPHAGE) 500 MG tablet TAKE 1 TABLET BY MOUTH 2 TIMES DAILY WITH A MEAL.   triamcinolone cream (KENALOG) 0.1 % Apply 1 application topically 2 (two) times daily.   [DISCONTINUED] fenofibrate (TRICOR) 145 MG tablet Take 145 mg by mouth daily.   No facility-administered encounter medications on file as of 12/04/2020.    Allergies (verified) Lopressor [metoprolol tartrate] and Beta adrenergic blockers   History: Past Medical History:  Diagnosis Date   Benign prostatic hyperplasia with urinary obstruction    Bladder neck obstruction 02/02/2012   Colon polyps 09/01/2011   Depression    Diabetes mellitus without complication ([facility] [id number]  Dyslipidemia associated with type 2 diabetes mellitus ([place]    Edema    edema in lower legs   Hyperlipemia    Hypertension 09/01/2011   Kyphosis deformity of spine    Lung disease    Melanoma ([facility]    on back   Past Surgical History:  Procedure Laterality Date   HERNIA REPAIR     1960's   MEDIAL PARTIAL KNEE REPLACEMENT  2015   melanoma removed from back  2008   sub mucus  2015   Family History  Problem Relation Age of Onset   Pancreatic  cancer Father    Stomach cancer Sister    Hypertension Maternal Grandmother    Stroke Maternal Grandfather    Heart disease Paternal Grandmother    Social History   Socioeconomic History   Marital status: Widowed    Spouse name: [name]    Number of children: 1   Years of education: 7   Highest education level: Occupational hygienist History   Occupation: Retired Air cabin crew   Tobacco Use   Smoking status: Former    Years: 10.00    Types: Cigarettes    Quit date: 1967    Years since quitting: 55.7   Smokeless  tobacco: Never  Vaping Use   Vaping Use: Never used  Substance and Sexual Activity   Alcohol use: Not Currently   Drug use: Never   Sexual activity: Not Currently  Other Topics Concern   Not on file  Social History Narrative   Social History      Diet? Low sodium       Do you drink/eat things with caffeine? No       Marital status?                            Widowed         What year were you married? 1958      Do you live in a house, apartment, assisted living, condo, trailer, etc.? house      Is it one or more stories? One       How many persons live in your home? One       Do you have any pets in your home? (please list) no       Highest level of education completed? Graduate degree divinity [employer]       Current or past profession: [employer]       Do you exercise?               No                        Type & how often?      Advanced Directives      Do you have a living will? yes      Do you have a DNR form?                                  If not, do you want to discuss one? No       Do you have signed POA/HPOA for forms? No       Functional Status      Do you have difficulty bathing or dressing yourself? Yes       Do you have difficulty preparing food or eating? Yes       Do you have difficulty managing your medications? No       Do you have difficulty managing your finances? No       Do you have difficulty affording your medications?no       Social Determinants of Health   Financial Resource Strain: Not on file  Food Insecurity: Not on file  Transportation Needs: Not on file  Physical Activity: Not on file  Stress: Not on file  Social Connections: Not on file    Tobacco Counseling Counseling given: Not Answered   Clinical Intake:  Pre-visit preparation completed: No  Pain : No/denies pain     BMI - recorded: 33.67 Diabetes: Yes CBG done?: Yes (120 this morning) CBG resulted in Enter/ Edit results?: No Did pt.  bring in CBG monitor from home?: No (repoerts on the phone CBG ranging in the 120's -140's)  How often do you need to have someone help you when you read instructions, pamphlets, or other written materials from your doctor or pharmacy?: 2 - Rarely What is the last grade level you completed in school?: Master's Degree  Diabetic?yes   Interpreter Needed?: No  Information entered by :: [place] Corl,FNP-C   Activities of Daily Living In your present state of health, do you have any difficulty performing the following activities: 12/04/2020  Hearing? Y  Comment decreased hearing on right ear  Vision? N  Difficulty concentrating or making decisions? Y  Comment remembering and takes too long to make decision  Walking or climbing stairs? Y  Comment uses a walker  Dressing or bathing? N  Doing errands, shopping? Y  Comment Daughter in Advertising account planner and eating ? N  Comment Has an assistant too  Using the Toilet? N  In the past six months, have you accidently leaked urine? N  Do you have problems with loss of bowel control? N  Managing your Medications? N  Managing your Finances? N  Housekeeping or managing your Housekeeping? Y  Comment Has housekeeper  Some recent data might be hidden    Patient Care Team: [name], MD as PCP - General (Family Medicine) Myrlene Broker, MD as Attending Physician (Urology) Ralene Bathe, MD (Ophthalmology) [name], MD as Referring Physician  Indicate any recent Medical Services you may have received from other than [facility] providers in the past year (date may be approximate).     Assessment:   This is a routine wellness examination for Brian [name].  Hearing/Vision screen Hearing Screening - Comments:: No hearing concerns. Patient doesn't wear hearing aids.  Vision Screening - Comments:: No vision concerns. Patient wears prescription glasses. Patient last eye exam was May 2022  Dietary issues and exercise  activities discussed: Current Exercise Habits: Home exercise routine, Type of exercise: walking;stretching, Time (Minutes): 10, Frequency (Times/Week): 3 (when weather permits), Weekly Exercise (Minutes/Week): 30, Intensity: Mild, Exercise limited by: Other - see comments (unsteady gait)   Goals Addressed             This Visit's Progress    Patient Stated       Work on exercise to gain stability on gait        Depression Screen PHQ 2/9 Scores 12/04/2020 05/20/2019  PHQ - 2 Score 0 0    Fall Risk Fall Risk  12/04/2020 10/03/2020 07/17/2020 07/17/2020 05/25/2020  Falls in the past year? 0 0 1 0 0  Number falls in past yr: 0 0 1 0 0  Injury with Fall? 0 0 1 0 0  Comment - - - - -  Risk for fall due to : No Fall Risks History of fall(s) Impaired balance/gait - -  Follow up Falls evaluation completed Falls evaluation completed;Education provided;Falls prevention discussed Education provided;Falls prevention discussed - -    FALL RISK PREVENTION PERTAINING TO THE HOME:  Any stairs in or around the home? No  If so, are there any without handrails? No  Home free of loose throw rugs in walkways, pet beds, electrical cords, etc? No  Adequate lighting in your home to reduce risk of falls? Yes  ASSISTIVE DEVICES UTILIZED TO PREVENT FALLS:  Life alert? Yes  Use of a cane, walker or w/c? Yes  Grab bars in the bathroom? Yes  Shower chair or bench in shower? Yes  Elevated toilet seat or a handicapped toilet? Yes   TIMED UP AND GO:  Was the test performed? No .  Length of time to ambulate 10 feet: N/A  sec.   Gait slow and steady with assistive device  Cognitive Function:     6CIT Screen 12/04/2020  What Year? 0 points  What month? 0 points  What time? 0 points  Count back from 20 0 points  Months in reverse 0 points  Repeat phrase 0 points  Total Score 0    Immunizations Immunization History  Administered Date(s) Administered   Fluad Quad(high Dose 65+) 12/22/2019   H1N1  03/27/2008, 03/27/2008   Influenza, High Dose Seasonal PF 12/18/2014, 11/26/2016, 12/30/2017, 12/30/2017, 12/27/2018, 12/27/2018   Influenza, Quadrivalent, Recombinant, Inj, Pf 12/29/2012   Influenza,inj,quad, With Preservative 12/30/2017   Influenza,trivalent, recombinat, inj, PF 12/14/2009   Influenza-Unspecified 12/14/2009, 12/30/2017, 12/30/2017, 12/27/2018   Moderna Sars-Covid-2 Vaccination 05/07/2019, 06/04/2019, 01/30/2020   Pneumococcal Conjugate-13 04/20/2013   Pneumococcal Polysaccharide-23 10/27/2006   Tdap 02/08/2015, 02/08/2015, 02/17/2019    TDAP status: Up to date  Flu Vaccine status: Due, Education has been provided regarding the importance of this vaccine. Advised may receive this vaccine at local pharmacy or Health Dept. Aware to provide a copy of the vaccination record if obtained from local pharmacy or Health Dept. Verbalized acceptance and understanding.  Pneumococcal vaccine status: Up to date  Covid-19 vaccine status: Information provided on how to obtain vaccines.   Qualifies for Shingles Vaccine? Yes   Zostavax completed No   Shingrix Completed?: No.    Education has been provided regarding the importance of this vaccine. Patient has been advised to call insurance company to determine out of pocket expense if they have not yet received this vaccine. Advised may also receive vaccine at local pharmacy or Health Dept. Verbalized acceptance and understanding.  Screening Tests Health Maintenance  Topic Date Due   Zoster Vaccines- Shingrix (1 of 2) Never done   COVID-19 Vaccine (4 - Booster for Moderna series) 04/23/2020   INFLUENZA VACCINE  10/22/2020   HEMOGLOBIN A1C  05/21/2021   FOOT EXAM  07/17/2021   URINE MICROALBUMIN  07/18/2021   OPHTHALMOLOGY EXAM  09/12/2021   TETANUS/TDAP  02/16/2029   PNA vac Low Risk Adult  Completed   HPV VACCINES  Aged Out    Health Maintenance  Health Maintenance Due  Topic Date Due   Zoster Vaccines- Shingrix (1 of 2)  Never done   COVID-19 Vaccine (4 - Booster for Moderna series) 04/23/2020   INFLUENZA VACCINE  10/22/2020    Colorectal cancer screening: No longer required.   Lung Cancer Screening: (Low Dose CT Chest recommended if Age 63-80 years, 30 pack-year currently smoking OR have quit w/in 15years.) does not qualify.   Lung Cancer Screening Referral: No   Additional Screening:  Hepatitis C Screening: does not qualify; Completed: N/A   Vision Screening: Recommended annual ophthalmology exams for early detection of glaucoma and other disorders of the eye. Is the patient up to date with their annual eye exam?  Yes  Who is the provider or what is the name of the office in which the patient attends annual eye exams? [facility]  If pt is not established with a provider, would they like to be referred to a  provider to establish care? No .   Dental Screening: Recommended annual dental exams for proper oral hygiene  Community Resource Referral / Chronic Care Management: CRR required this visit?  No   CCM required this visit?  No      Plan:     I have personally reviewed and noted the following in the patient's chart:   Medical and social history Use of alcohol, tobacco or illicit drugs  Current medications and supplements including opioid prescriptions. Patient is not currently taking opioid prescriptions. Functional ability and status Nutritional status Physical activity Advanced directives List of other physicians Hospitalizations, surgeries, and ER visits in previous 12 months Vitals Screenings to include cognitive, depression, and falls Referrals and appointments  In addition, I have reviewed and discussed with patient certain preventive protocols, quality metrics, and best practice recommendations. A written personalized care plan for preventive services as well as general preventive health recommendations were provided to patient.     Sandrea Hughs,  NP   12/04/2020   Nurse Notes: Advised to get Shingrix and 4th COVID-19 booster vaccine at his pharmacy.will also arrange with son to get Flu shot at the pharmacy or at [facility] office.

## 2021-01-22 DEATH — deceased

## 2021-02-15 ENCOUNTER — Other Ambulatory Visit: Payer: Self-pay | Admitting: Family Medicine

## 2021-02-15 DIAGNOSIS — I890 Lymphedema, not elsewhere classified: Secondary | ICD-10-CM

## 2021-03-13 DIAGNOSIS — L814 Other melanin hyperpigmentation: Secondary | ICD-10-CM | POA: Diagnosis not present

## 2021-03-13 DIAGNOSIS — Z08 Encounter for follow-up examination after completed treatment for malignant neoplasm: Secondary | ICD-10-CM | POA: Diagnosis not present

## 2021-03-13 DIAGNOSIS — I872 Venous insufficiency (chronic) (peripheral): Secondary | ICD-10-CM | POA: Diagnosis not present

## 2021-03-13 DIAGNOSIS — L821 Other seborrheic keratosis: Secondary | ICD-10-CM | POA: Diagnosis not present

## 2021-03-13 DIAGNOSIS — Z85820 Personal history of malignant melanoma of skin: Secondary | ICD-10-CM | POA: Diagnosis not present

## 2021-03-13 DIAGNOSIS — Z85828 Personal history of other malignant neoplasm of skin: Secondary | ICD-10-CM | POA: Diagnosis not present

## 2021-03-13 DIAGNOSIS — D225 Melanocytic nevi of trunk: Secondary | ICD-10-CM | POA: Diagnosis not present

## 2021-03-26 ENCOUNTER — Encounter: Payer: Self-pay | Admitting: Family Medicine

## 2021-03-26 ENCOUNTER — Ambulatory Visit (INDEPENDENT_AMBULATORY_CARE_PROVIDER_SITE_OTHER): Payer: Medicare (Managed Care) | Admitting: Family Medicine

## 2021-03-26 ENCOUNTER — Other Ambulatory Visit: Payer: Self-pay

## 2021-03-26 VITALS — BP 132/76 | HR 79 | Temp 97.5°F | Ht 69.0 in | Wt 228.0 lb

## 2021-03-26 DIAGNOSIS — E1159 Type 2 diabetes mellitus with other circulatory complications: Secondary | ICD-10-CM

## 2021-03-26 DIAGNOSIS — I890 Lymphedema, not elsewhere classified: Secondary | ICD-10-CM | POA: Diagnosis not present

## 2021-03-26 NOTE — Patient Instructions (Signed)
May take extra lasix based on weight change comparing weight to prior day Try to walk a little more to help with the edema

## 2021-03-26 NOTE — Progress Notes (Signed)
Provider:  Alain Honey, MD  Careteam: Patient Care Team: [name], MD as PCP - General (Family Medicine) Myrlene Broker, MD as Attending Physician (Urology) Ralene Bathe, MD (Ophthalmology) [name], MD as Referring Physician  PLACE OF SERVICE:  [place]  Advanced Directive information    Allergies  Allergen Reactions   Lopressor [Metoprolol Tartrate]     Mental status changes (intolerance)    Beta Adrenergic Blockers     "they make me depressed"    Chief Complaint  Patient presents with   Medical Management of Chronic Issues    Patient presents today for a 4 month follow-up.     HPI: Patient is a 88 y.o. male patient is here to follow-up dependent edema.  At his last visit we increased his furosemide to daily from 3 days a week but at some point he stopped that.  He continues to have stasis dermatitis and has seen dermatologist who deferred to our care.  He denies any shortness of breath.  Renal function was assessed in August with normal BUN and creatinine.  A1c was likewise assessed and was at 7.1. Patient does have some insight as to his edema.  He uses support stockings and elevation.  Review of Systems:  Review of Systems  Constitutional:  Positive for malaise/fatigue. Negative for weight loss.  Respiratory: Negative.    Cardiovascular:  Positive for leg swelling.  Genitourinary: Negative.   Musculoskeletal: Negative.   All other systems reviewed and are negative.  Past Medical History:  Diagnosis Date   Benign prostatic hyperplasia with urinary obstruction    Bladder neck obstruction 02/02/2012   Colon polyps 09/01/2011   Depression    Diabetes mellitus without complication ([place]) 22/29/7989   Dyslipidemia associated with type 2 diabetes mellitus ([place])    Edema    edema in lower legs   Hyperlipemia    Hypertension 09/01/2011   Kyphosis deformity of spine    Lung disease    Melanoma ([place])    on back   Past Surgical  History:  Procedure Laterality Date   HERNIA REPAIR     1960's   MEDIAL PARTIAL KNEE REPLACEMENT  2015   melanoma removed from back  2008   sub mucus  2015   Social History:   reports that he quit smoking about 56 years ago. His smoking use included cigarettes. He has never used smokeless tobacco. He reports that he does not currently use alcohol. He reports that he does not use drugs.  Family History  Problem Relation Age of Onset   Pancreatic cancer Father    Stomach cancer Sister    Hypertension Maternal Grandmother    Stroke Maternal Grandfather    Heart disease Paternal Grandmother     Medications: Patient's Medications  New Prescriptions   No medications on file  Previous Medications   ACCU-CHEK GUIDE TEST STRIP       ALBUTEROL (VENTOLIN HFA) 108 (90 BASE) MCG/ACT INHALER    Inhale 1 puff into the lungs as needed for wheezing or shortness of breath.   ASPIRIN 81 MG TABLET    Take 81 mg by mouth daily.   DICLOFENAC SODIUM (VOLTAREN) 1 % GEL    Apply 4 g topically 4 (four) times daily.   EMPAGLIFLOZIN (JARDIANCE) 10 MG TABS TABLET    Take 1 tablet (10 mg total) by mouth daily.   FINASTERIDE (PROSCAR) 5 MG TABLET    Take 5 mg by mouth daily.  FLUTICASONE (FLONASE) 50 MCG/ACT NASAL SPRAY    Place 1 spray into both nostrils daily.   FUROSEMIDE (LASIX) 40 MG TABLET    TAKE 1 TABLET BY MOUTH EVERY DAY   MAGNESIUM HYDROXIDE (MILK OF MAGNESIA) 400 MG/5ML SUSPENSION    1/2 capful by mouth every night   METFORMIN (GLUCOPHAGE) 500 MG TABLET    TAKE 1 TABLET BY MOUTH 2 TIMES DAILY WITH A MEAL.   TRIAMCINOLONE CREAM (KENALOG) 0.1 %    Apply 1 application topically 2 (two) times daily.  Modified Medications   No medications on file  Discontinued Medications   No medications on file    Physical Exam:  There were no vitals filed for this visit. There is no height or weight on file to calculate BMI. Wt Readings from Last 3 Encounters:  11/20/20 228 lb (103.4 kg)  07/17/20 227 lb  12.8 oz (103.3 kg)  05/25/20 234 lb (106.1 kg)    Physical Exam Vitals and nursing note reviewed.  Constitutional:      Appearance: Normal appearance.  Cardiovascular:     Rate and Rhythm: Normal rate and regular rhythm.  Pulmonary:     Effort: Pulmonary effort is normal.     Breath sounds: Normal breath sounds.  Musculoskeletal:     Right lower leg: Edema present.     Left lower leg: Edema present.  Skin:    Findings: Erythema present.     Comments: Both legs are red from just below the knees to the ankles.  There is no increase in temp to suggest cellulitis.  He continues to use triamcinolone  Neurological:     Mental Status: He is alert.    Labs reviewed: Basic Metabolic Panel: Recent Labs    07/09/20 1149 11/20/20 1506  NA 141 139  K 4.5 4.4  CL 101 102  CO2 29 31  GLUCOSE 125 91  BUN 21 20  CREATININE 0.93 0.81  CALCIUM 9.5 9.7   Liver Function Tests: No results for input(s): AST, ALT, ALKPHOS, BILITOT, PROT, ALBUMIN in the last 8760 hours. No results for input(s): LIPASE, AMYLASE in the last 8760 hours. No results for input(s): AMMONIA in the last 8760 hours. CBC: Recent Labs    05/25/20 1229  WBC 7.5  NEUTROABS 6,143  HGB 16.1  HCT 49.6  MCV 96.1  PLT 228   Lipid Panel: No results for input(s): CHOL, HDL, LDLCALC, TRIG, CHOLHDL, LDLDIRECT in the last 8760 hours. TSH: No results for input(s): TSH in the last 8760 hours. A1C: Lab Results  Component Value Date   HGBA1C 7.1 (H) 11/20/2020     Assessment/Plan 1. Type 2 diabetes mellitus with other circulatory complication, without long-term current use of insulin (HCC) Patient checks his sugars about once per week.  Again he knows foods to eat or not eat.  Last A1c was 7.1.  When asked about what is the highest he seen sugar on home checking, there is some discrepancy between his daughter-in-law and himself so will let the A1c be the official score keeper.   2. Lymphedema of both lower  extremities Legs look about the same.  There is 2-3+ edema with stasis dermatitis as described above.  I have asked him to use Lasix as follows if and weighing himself every day there is a gain of more than 3 pounds compared to previous day take an extra Lasix that day.  Continue to use support stockings and elevation when possible.  Avoid excess sodium.  Have also  suggested he do as much walking as he is able with his walker.  Contraction of muscles.  Continue with triamcinolone   Alain Honey, MD [place] [phone]

## 2021-03-27 LAB — HEMOGLOBIN A1C
Hgb A1c MFr Bld: 7.3 % of total Hgb — ABNORMAL HIGH (ref <5.7)
Mean Plasma Glucose: 163 mg/dL
eAG (mmol/L): 9 mmol/L

## 2021-05-10 ENCOUNTER — Other Ambulatory Visit: Payer: Self-pay | Admitting: Family Medicine

## 2021-05-10 ENCOUNTER — Other Ambulatory Visit: Payer: Self-pay

## 2021-05-10 DIAGNOSIS — I872 Venous insufficiency (chronic) (peripheral): Secondary | ICD-10-CM

## 2021-05-10 MED ORDER — METFORMIN HCL 500 MG PO TABS: 500.0000 mg | ORAL_TABLET | Freq: Two times a day (BID) | ORAL | 1 refills | Status: DC

## 2021-05-30 IMAGING — CR DG KNEE COMPLETE 4+V*R*
4 series · 4 of 4 positions shown · non-contrast
Comparison: None available

CLINICAL DATA: Chronic right knee pain, recent injury 1 month ago
history of partial knee replacement

EXAM:
RIGHT KNEE - COMPLETE 4+ VIEW

[w knee ap right]
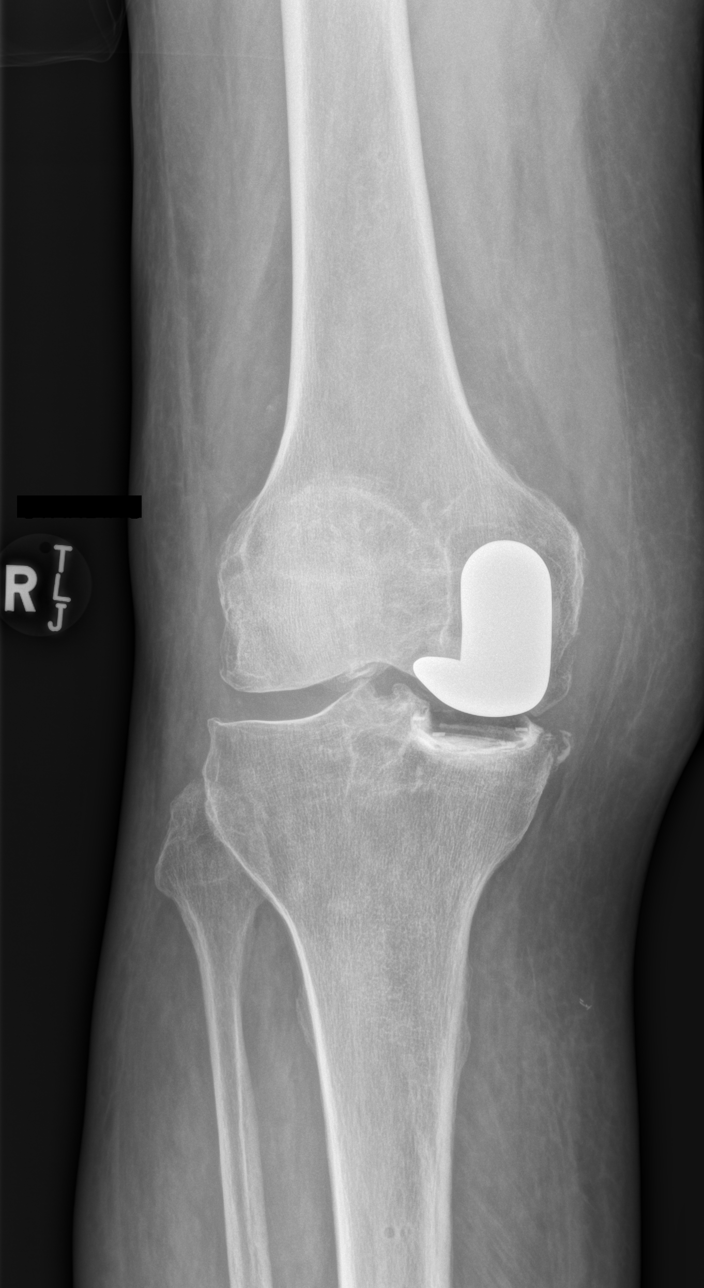

[w knee lat right]
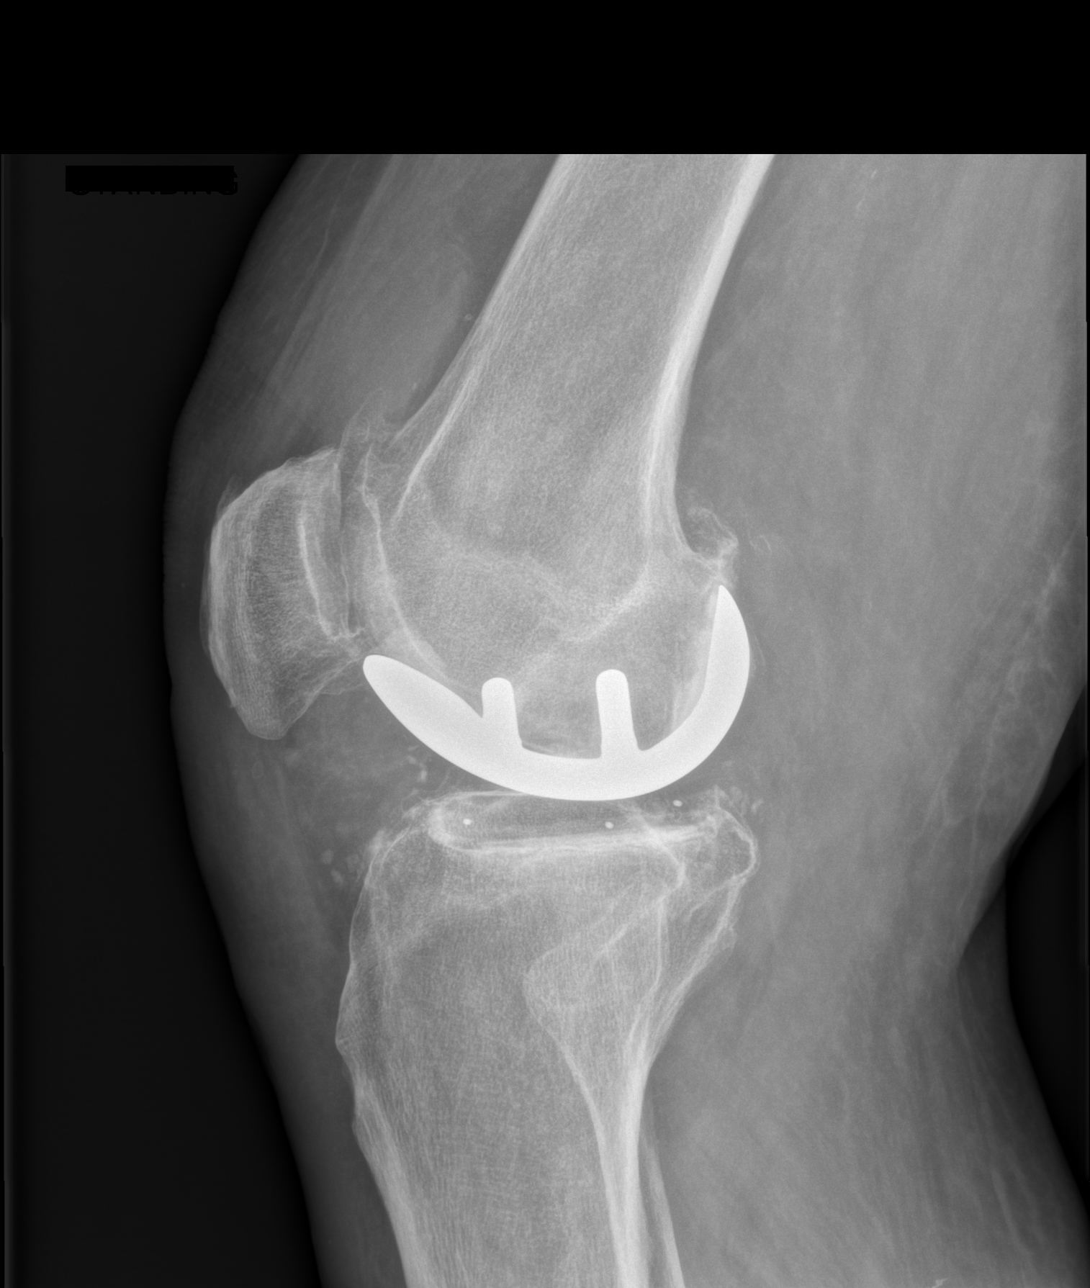

[w knee tunnel pa right]
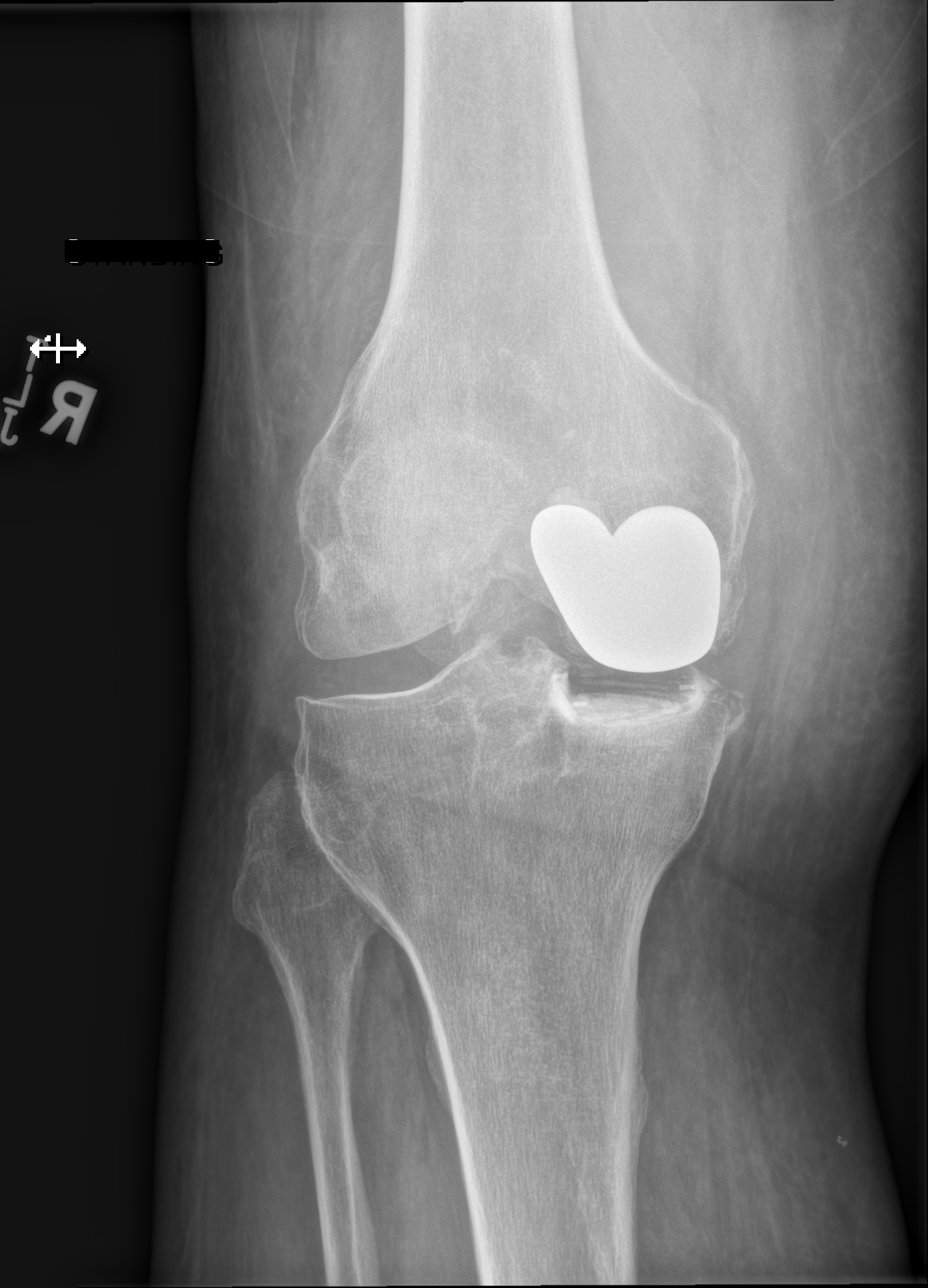

[x knee sunrise right]
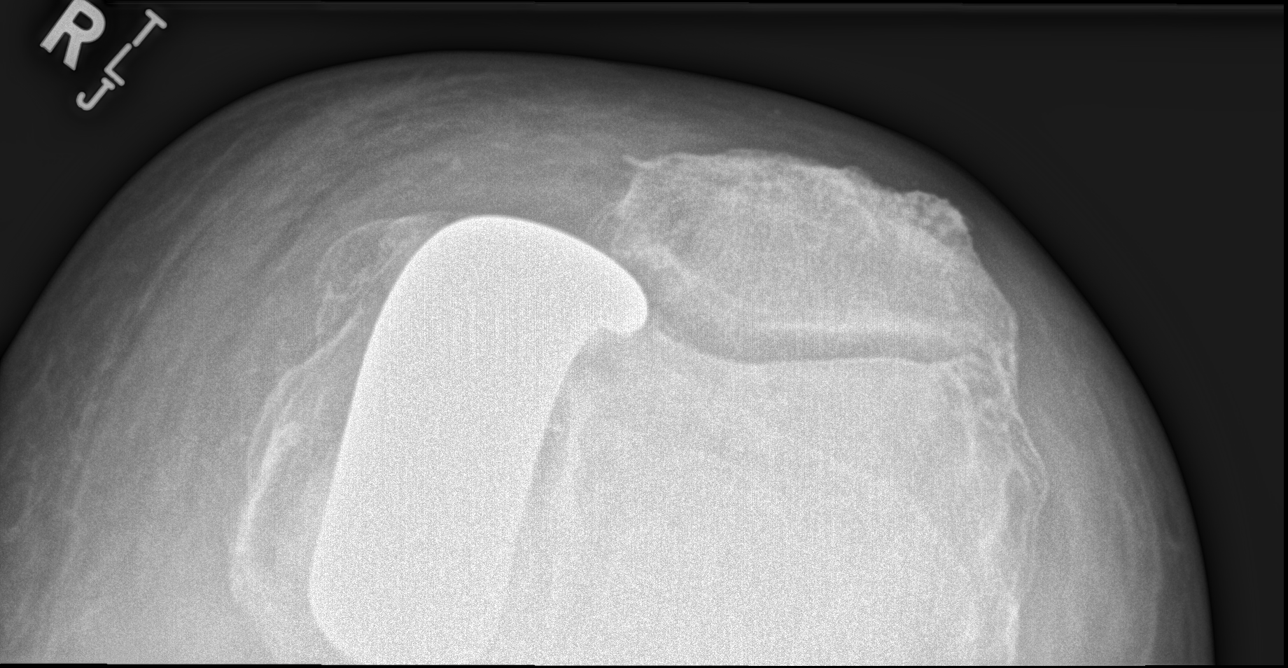

[4 of 4 positions shown; findings below may reference images not displayed]

FINDINGS: Right knee medial compartment hemiarthroplasty changes noted. Mild
osteopenia. Slight degenerative changes of the lateral compartment
and moderate degenerative changes of the patellofemoral compartment.
Small effusion on the lateral view. Diffuse soft tissue swelling of
the visualized right lower leg.
IMPRESSION: Remote right knee medial hemiarthroplasty.

Lateral and patellofemoral compartment osteoarthritis.

Small joint effusion

No acute osseous finding.

## 2021-06-11 DIAGNOSIS — C44629 Squamous cell carcinoma of skin of left upper limb, including shoulder: Secondary | ICD-10-CM | POA: Diagnosis not present

## 2021-07-30 ENCOUNTER — Ambulatory Visit (INDEPENDENT_AMBULATORY_CARE_PROVIDER_SITE_OTHER): Payer: Medicare (Managed Care) | Admitting: Family Medicine

## 2021-07-30 ENCOUNTER — Encounter: Payer: Self-pay | Admitting: Family Medicine

## 2021-07-30 VITALS — BP 118/66 | HR 89 | Temp 97.1°F

## 2021-07-30 DIAGNOSIS — Z6835 Body mass index (BMI) 35.0-35.9, adult: Secondary | ICD-10-CM | POA: Diagnosis not present

## 2021-07-30 DIAGNOSIS — E11610 Type 2 diabetes mellitus with diabetic neuropathic arthropathy: Secondary | ICD-10-CM

## 2021-07-30 DIAGNOSIS — E1159 Type 2 diabetes mellitus with other circulatory complications: Secondary | ICD-10-CM

## 2021-07-30 DIAGNOSIS — E66812 Obesity, class 2: Secondary | ICD-10-CM

## 2021-07-30 NOTE — Progress Notes (Signed)
? ? ?Provider:  ?Alain Honey, MD ? ?Careteam: ?Patient Care Team: ?Brian Honour, MD as PCP - General (Family Medicine) ?Myrlene Broker, MD as Attending Physician (Urology) ?Ralene Bathe, MD (Ophthalmology) ?Brian Argue, MD as Referring Physician ? ?PLACE OF SERVICE:  ?[facility]  ?Advanced Directive information ?Does Patient Have a Medical Advance Directive?: Yes, Type of Advance Directive: [place], Does patient want to make changes to medical advance directive?: No - Patient declined ? ?Allergies  ?Allergen Reactions  ? Lopressor [Metoprolol Tartrate]   ?  Mental status changes (intolerance)   ? Beta Adrenergic Blockers   ?  "they make me depressed"  ? ? ?Chief Complaint  ?Patient presents with  ? Medical Management of Chronic Issues  ?  5 month follow-up. Foot exam today. Discuss need for shingrix, additional covid boosters, and urine microalbumin or post pone if patient refuses.   ? ? ? ?HPI: Patient is a 88 y.o. male for routine follow-up visit for this 88 year old gentleman with type 2 diabetes, lymphedema with chronic stasis dermatitis medications include metformin and Jardiance, Lasix for the lymphedema ?He has been doing well recently.  He continues to move around with his walker.  Blood sugars are generally less than 150 on a fasting specimen.  Most recent A1c 4 months ago was 7.3. ? ?Review of Systems:  ?Review of Systems  ?Constitutional: Negative.   ?HENT: Negative.    ?Respiratory: Negative.    ?Cardiovascular:  Positive for leg swelling.  ?Musculoskeletal: Negative.   ?Neurological:  Positive for tremors.  ? ?Past Medical History:  ?Diagnosis Date  ? Benign prostatic hyperplasia with urinary obstruction   ? Bladder neck obstruction 02/02/2012  ? Colon polyps 09/01/2011  ? Depression   ? Diabetes mellitus without complication ([place]) 18/84/1660  ? Dyslipidemia associated with type 2 diabetes mellitus ([place])   ? Edema   ? edema in lower legs  ? Hyperlipemia   ?  Hypertension 09/01/2011  ? Kyphosis deformity of spine   ? Lung disease   ? Melanoma ([place])   ? on back  ? ?Past Surgical History:  ?Procedure Laterality Date  ? HERNIA REPAIR    ? 1960's  ? MEDIAL PARTIAL KNEE REPLACEMENT  2015  ? melanoma removed from back  2008  ? sub mucus  2015  ? ?Social History: ?  reports that he quit smoking about 56 years ago. His smoking use included cigarettes. He has never used smokeless tobacco. He reports that he does not currently use alcohol. He reports that he does not use drugs. ? ?Family History  ?Problem Relation Age of Onset  ? Pancreatic cancer Father   ? Stomach cancer Sister   ? Hypertension Maternal Grandmother   ? Stroke Maternal Grandfather   ? Heart disease Paternal Grandmother   ? ? ?Medications: ?Patient's Medications  ?New Prescriptions  ? No medications on file  ?Previous Medications  ? ACCU-CHEK GUIDE TEST STRIP      ? ALBUTEROL (VENTOLIN HFA) 108 (90 BASE) MCG/ACT INHALER    Inhale 1 puff into the lungs as needed for wheezing or shortness of breath.  ? ASPIRIN 81 MG TABLET    Take 81 mg by mouth daily.  ? DICLOFENAC SODIUM (VOLTAREN) 1 % GEL    Apply 4 g topically 4 (four) times daily.  ? FINASTERIDE (PROSCAR) 5 MG TABLET    Take 5 mg by mouth daily.  ? FLUTICASONE (FLONASE) 50 MCG/ACT NASAL SPRAY    Place 1  spray into both nostrils daily.  ? FUROSEMIDE (LASIX) 40 MG TABLET    TAKE 1 TABLET BY MOUTH EVERY DAY  ? JARDIANCE 10 MG TABS TABLET    TAKE 1 TABLET BY MOUTH EVERY DAY  ? MAGNESIUM HYDROXIDE (MILK OF MAGNESIA) 400 MG/5ML SUSPENSION    1/2 capful by mouth every night  ? METFORMIN (GLUCOPHAGE) 500 MG TABLET    Take 1 tablet (500 mg total) by mouth 2 (two) times daily with a meal.  ? TRIAMCINOLONE CREAM (KENALOG) 0.1 %    APPLY TO AFFECTED AREA TWICE A DAY  ?Modified Medications  ? No medications on file  ?Discontinued Medications  ? No medications on file  ? ? ?Physical Exam: ? ?There were no vitals filed for this visit. ?There is no height or weight on file to  calculate BMI. ?Wt Readings from Last 3 Encounters:  ?03/26/21 228 lb (103.4 kg)  ?11/20/20 228 lb (103.4 kg)  ?07/17/20 227 lb 12.8 oz (103.3 kg)  ? ? ?Physical Exam ?Vitals and nursing note reviewed.  ?Constitutional:   ?   Appearance: Normal appearance. He is obese.  ?HENT:  ?   Head: Normocephalic.  ?Cardiovascular:  ?   Rate and Rhythm: Normal rate and regular rhythm.  ?Pulmonary:  ?   Effort: Pulmonary effort is normal.  ?   Breath sounds: Normal breath sounds.  ?Musculoskeletal:  ?   Right lower leg: Edema present.  ?   Left lower leg: Edema present.  ?   Comments: Changes of stasis dermatitis but there are no wounds on the skin  ?Neurological:  ?   General: No focal deficit present.  ?   Mental Status: He is alert and oriented to person, place, and time.  ?Psychiatric:     ?   Mood and Affect: Mood normal.     ?   Behavior: Behavior normal.  ? ? ?Labs reviewed: ?Basic Metabolic Panel: ?Recent Labs  ?  11/20/20 ?1506  ?NA 139  ?K 4.4  ?CL 102  ?CO2 31  ?GLUCOSE 91  ?BUN 20  ?CREATININE 0.81  ?CALCIUM 9.7  ? ?Liver Function Tests: ?No results for input(s): AST, ALT, ALKPHOS, BILITOT, PROT, ALBUMIN in the last 8760 hours. ?No results for input(s): LIPASE, AMYLASE in the last 8760 hours. ?No results for input(s): AMMONIA in the last 8760 hours. ?CBC: ?No results for input(s): WBC, NEUTROABS, HGB, HCT, MCV, PLT in the last 8760 hours. ?Lipid Panel: ?No results for input(s): CHOL, HDL, LDLCALC, TRIG, CHOLHDL, LDLDIRECT in the last 8760 hours. ?TSH: ?No results for input(s): TSH in the last 8760 hours. ?A1C: ?Lab Results  ?Component Value Date  ? HGBA1C 7.3 (H) 03/26/2021  ? ? ? ?Assessment/Plan ? ?1. Type 2 diabetes mellitus with other circulatory complication, without long-term current use of insulin ([place]) ?Doing well with combination metformin and Jardiance.  Diet is good ? ?2. Class 2 severe obesity due to excess calories with serious comorbidity and body mass index (BMI) of 35.0 to 35.9 in adult  [employer]) ?Ambulates with walker.  I think he does well for given his age and obesity ? ? ? ? ?Alain Honey, MD ?[facility] ?[phone]  ? ?

## 2021-07-30 NOTE — Patient Instructions (Signed)
No change in medications ?Continue to move and walk as tolerated ?

## 2021-07-31 LAB — BASIC METABOLIC PANEL WITH GFR
BUN: 24 mg/dL (ref 7–25)
CO2: 31 mmol/L (ref 20–32)
Calcium: 9.8 mg/dL (ref 8.6–10.3)
Chloride: 101 mmol/L (ref 98–110)
Creat: 0.94 mg/dL (ref 0.70–1.22)
Glucose, Bld: 151 mg/dL — ABNORMAL HIGH (ref 65–139)
Potassium: 4.5 mmol/L (ref 3.5–5.3)
Sodium: 140 mmol/L (ref 135–146)
eGFR: 78 mL/min/{1.73_m2} (ref >=60)

## 2021-07-31 LAB — MICROALBUMIN / CREATININE URINE RATIO
Creatinine, Urine: 40 mg/dL (ref 20–320)
Microalb Creat Ratio: 30 mcg/mg creat — ABNORMAL HIGH (ref <30)
Microalb, Ur: 1.2 mg/dL

## 2021-07-31 LAB — TEST AUTHORIZATION: TEST CODE:: 6517

## 2021-07-31 LAB — HEMOGLOBIN A1C
Hgb A1c MFr Bld: 7.2 % of total Hgb — ABNORMAL HIGH (ref <5.7)
Mean Plasma Glucose: 160 mg/dL
eAG (mmol/L): 8.9 mmol/L

## 2021-09-13 LAB — HM DIABETES EYE EXAM

## 2021-10-16 ENCOUNTER — Ambulatory Visit (INDEPENDENT_AMBULATORY_CARE_PROVIDER_SITE_OTHER): Payer: Medicare (Managed Care) | Admitting: Family Medicine

## 2021-10-16 ENCOUNTER — Encounter: Payer: Self-pay | Admitting: Family Medicine

## 2021-10-16 VITALS — BP 170/80 | HR 90 | Temp 97.7°F | Ht 69.0 in | Wt 237.4 lb

## 2021-10-16 DIAGNOSIS — I890 Lymphedema, not elsewhere classified: Secondary | ICD-10-CM

## 2021-10-16 DIAGNOSIS — Z6835 Body mass index (BMI) 35.0-35.9, adult: Secondary | ICD-10-CM

## 2021-10-16 DIAGNOSIS — E11610 Type 2 diabetes mellitus with diabetic neuropathic arthropathy: Secondary | ICD-10-CM | POA: Diagnosis not present

## 2021-10-16 NOTE — Progress Notes (Signed)
Provider:  Alain Honey, MD  Careteam: Patient Care Team: [name], MD as PCP - General (Family Medicine) Myrlene Broker, MD as Attending Physician (Urology) Ralene Bathe, MD (Ophthalmology) [name], MD as Referring Physician  PLACE OF SERVICE:  [place]  Advanced Directive information    Allergies  Allergen Reactions   Lopressor [Metoprolol Tartrate]     Mental status changes (intolerance)    Beta Adrenergic Blockers     "they make me depressed"    Chief Complaint  Patient presents with   Acute Visit    Patient presents today for a  hemorrhoid with bleeding. Concerns with bilateral hearing loss. Bilateral leg swelling and discoloration. He does wear ted hose/ compression socks.     HPI: Patient is a 88 y.o. male complains of some bleeding with hemorrhoids in, bilateral leg swelling and discoloration.  Also having some issues with son who has metastatic cancer.  May need med for anxiety and/or depression going forward but not yet.  Request home health referral to help with his lymphedema and related treatments  Review of Systems:  Review of Systems  Constitutional: Negative.   Cardiovascular:  Positive for leg swelling.  Gastrointestinal:  Positive for blood in stool, constipation and diarrhea.  Neurological: Negative.   Psychiatric/Behavioral: Negative.    All other systems reviewed and are negative.   Past Medical History:  Diagnosis Date   Benign prostatic hyperplasia with urinary obstruction    Bladder neck obstruction 02/02/2012   Colon polyps 09/01/2011   Depression    Diabetes mellitus without complication ([place]) 16/12/9602   Dyslipidemia associated with type 2 diabetes mellitus ([place])    Edema    edema in lower legs   Hyperlipemia    Hypertension 09/01/2011   Kyphosis deformity of spine    Lung disease    Melanoma ([place])    on back   Past Surgical History:  Procedure Laterality Date   HERNIA REPAIR     1960's    MEDIAL PARTIAL KNEE REPLACEMENT  2015   melanoma removed from back  2008   sub mucus  2015   Social History:   reports that he quit smoking about 56 years ago. His smoking use included cigarettes. He has never used smokeless tobacco. He reports that he does not currently use alcohol. He reports that he does not use drugs.  Family History  Problem Relation Age of Onset   Pancreatic cancer Father    Stomach cancer Sister    Hypertension Maternal Grandmother    Stroke Maternal Grandfather    Heart disease Paternal Grandmother     Medications: Patient's Medications  New Prescriptions   No medications on file  Previous Medications   ACCU-CHEK GUIDE TEST STRIP       ALBUTEROL (VENTOLIN HFA) 108 (90 BASE) MCG/ACT INHALER    Inhale 1 puff into the lungs as needed for wheezing or shortness of breath.   ASPIRIN 81 MG TABLET    Take 81 mg by mouth daily.   DICLOFENAC SODIUM (VOLTAREN) 1 % GEL    Apply 4 g topically 4 (four) times daily.   FINASTERIDE (PROSCAR) 5 MG TABLET    Take 5 mg by mouth daily.   FLUTICASONE (FLONASE) 50 MCG/ACT NASAL SPRAY    Place 1 spray into both nostrils daily.   FUROSEMIDE (LASIX) 40 MG TABLET    TAKE 1 TABLET BY MOUTH EVERY DAY   JARDIANCE 10 MG TABS TABLET    TAKE  1 TABLET BY MOUTH EVERY DAY   MAGNESIUM HYDROXIDE (MILK OF MAGNESIA) 400 MG/5ML SUSPENSION    1/2 capful by mouth every night   METFORMIN (GLUCOPHAGE) 500 MG TABLET    Take 1 tablet (500 mg total) by mouth 2 (two) times daily with a meal.   TRIAMCINOLONE CREAM (KENALOG) 0.1 %    APPLY TO AFFECTED AREA TWICE A DAY  Modified Medications   No medications on file  Discontinued Medications   No medications on file    Physical Exam:  Vitals:   10/16/21 1026  BP: (!) 170/80  Pulse: 90  Temp: 97.7 F (36.5 C)  SpO2: 96%  Weight: 237 lb 6.4 oz (107.7 kg)  Height: '5\' 9"'$  (1.753 m)   Body mass index is 35.06 kg/m. Wt Readings from Last 3 Encounters:  10/16/21 237 lb 6.4 oz (107.7 kg)  03/26/21  228 lb (103.4 kg)  11/20/20 228 lb (103.4 kg)    Physical Exam Vitals and nursing note reviewed.  Constitutional:      Appearance: Normal appearance.  Cardiovascular:     Rate and Rhythm: Normal rate.  Genitourinary:    Comments: Hemorrhoid present but no active bleeding Musculoskeletal:     Right lower leg: Edema present.     Left lower leg: Edema present.     Comments: Edema 3+ in the right leg 2+ in the left leg with some significant stasis dermatitis  Neurological:     Mental Status: He is alert.     Labs reviewed: Basic Metabolic Panel: Recent Labs    11/20/20 1506 07/30/21 1341  NA 139 140  K 4.4 4.5  CL 102 101  CO2 31 31  GLUCOSE 91 151*  BUN 20 24  CREATININE 0.81 0.94  CALCIUM 9.7 9.8   Liver Function Tests: No results for input(s): "AST", "ALT", "ALKPHOS", "BILITOT", "PROT", "ALBUMIN" in the last 8760 hours. No results for input(s): "LIPASE", "AMYLASE" in the last 8760 hours. No results for input(s): "AMMONIA" in the last 8760 hours. CBC: No results for input(s): "WBC", "NEUTROABS", "HGB", "HCT", "MCV", "PLT" in the last 8760 hours. Lipid Panel: No results for input(s): "CHOL", "HDL", "LDLCALC", "TRIG", "CHOLHDL", "LDLDIRECT" in the last 8760 hours. TSH: No results for input(s): "TSH" in the last 8760 hours. A1C: Lab Results  Component Value Date   HGBA1C 7.2 (H) 07/30/2021     Assessment/Plan  1. Class 2 severe obesity due to excess calories with serious comorbidity and body mass index (BMI) of 35.0 to 35.9 in adult (HCC) Weight is stable.  Watches his diet pretty well as he is a diabetic  2. Lymphedema of both lower extremities Continues with compression stockings, Lasix 40 mg three times /week Requests home health referral  3. Type 2 diabetes mellitus with diabetic neuropathic arthropathy, without long-term current use of insulin ([place]) A1C is okay on jardiance and metformin   Alain Honey, MD [facility] [phone]

## 2021-10-18 ENCOUNTER — Other Ambulatory Visit: Payer: Self-pay

## 2021-10-18 DIAGNOSIS — I890 Lymphedema, not elsewhere classified: Secondary | ICD-10-CM

## 2021-11-18 ENCOUNTER — Other Ambulatory Visit: Payer: Self-pay | Admitting: Family Medicine

## 2021-12-03 ENCOUNTER — Ambulatory Visit: Payer: 59 | Admitting: Family Medicine

## 2021-12-06 ENCOUNTER — Ambulatory Visit (INDEPENDENT_AMBULATORY_CARE_PROVIDER_SITE_OTHER): Payer: Medicare (Managed Care) | Admitting: Family

## 2021-12-06 VITALS — Ht 72.0 in | Wt 220.0 lb

## 2021-12-06 DIAGNOSIS — Z0000 Encounter for general adult medical examination without abnormal findings: Secondary | ICD-10-CM

## 2021-12-06 NOTE — Patient Instructions (Signed)
Brian [name] , Thank you for taking time to come for your Medicare Wellness Visit. I appreciate your ongoing commitment to your health goals. Please review the following plan we discussed and let me know if I can assist you in the future.   Screening recommendations/referrals: Colonoscopy: N/A  Recommended yearly ophthalmology/optometry visit for glaucoma screening and checkup Recommended yearly dental visit for hygiene and checkup  Vaccinations: Influenza vaccine due annually in September/October Pneumococcal vaccine : Up to date  Tdap vaccine : Up to date  Shingles vaccine Due please get vaccine at the pharmacy     Advanced directives: Yes   Conditions/risks identified: Cardiac Risk Factors include: advanced age (>23mn, >>75women);diabetes mellitus;male gender;obesity (BMI >30kg/m2);dyslipidemia;smoking/ tobacco exposure  Next appointment: 1 year   Preventive Care 658Years and Older, Male Preventive care refers to lifestyle choices and visits with your health care provider that can promote health and wellness. What does preventive care include? A yearly physical exam. This is also called an annual well check. Dental exams once or twice a year. Routine eye exams. Ask your health care provider how often you should have your eyes checked. Personal lifestyle choices, including: Daily care of your teeth and gums. Regular physical activity. Eating a healthy diet. Avoiding tobacco and drug use. Limiting alcohol use. Practicing safe sex. Taking low doses of aspirin every day. Taking vitamin and mineral supplements as recommended by your health care provider. What happens during an annual well check? The services and screenings done by your health care provider during your annual well check will depend on your age, overall health, lifestyle risk factors, and family history of disease. Counseling  Your health care provider may ask you questions about your: Alcohol use. Tobacco  use. Drug use. Emotional well-being. Home and relationship well-being. Sexual activity. Eating habits. History of falls. Memory and ability to understand (cognition). Work and work eStatistician Screening  You may have the following tests or measurements: Height, weight, and BMI. Blood pressure. Lipid and cholesterol levels. These may be checked every 5 years, or more frequently if you are over 581years old. Skin check. Lung cancer screening. You may have this screening every year starting at age 8271if you have a 30-pack-year history of smoking and currently smoke or have quit within the past 15 years. Fecal occult blood test (FOBT) of the stool. You may have this test every year starting at age 86 Flexible sigmoidoscopy or colonoscopy. You may have a sigmoidoscopy every 5 years or a colonoscopy every 10 years starting at age 86 Prostate cancer screening. Recommendations will vary depending on your family history and other risks. Hepatitis C blood test. Hepatitis B blood test. Sexually transmitted disease (STD) testing. Diabetes screening. This is done by checking your blood sugar (glucose) after you have not eaten for a while (fasting). You may have this done every 1-3 years. Abdominal aortic aneurysm (AAA) screening. You may need this if you are a current or former smoker. Osteoporosis. You may be screened starting at age 1226if you are at high risk. Talk with your health care provider about your test results, treatment options, and if necessary, the need for more tests. Vaccines  Your health care provider may recommend certain vaccines, such as: Influenza vaccine. This is recommended every year. Tetanus, diphtheria, and acellular pertussis (Tdap, Td) vaccine. You may need a Td booster every 10 years. Zoster vaccine. You may need this after age 562 Pneumococcal 13-valent conjugate (PCV13) vaccine. One dose is recommended after age  65. Pneumococcal polysaccharide (PPSV23) vaccine.  One dose is recommended after age 46. Talk to your health care provider about which screenings and vaccines you need and how often you need them. This information is not intended to replace advice given to you by your health care provider. Make sure you discuss any questions you have with your health care provider. Document Released: 04/06/2015 Document Revised: 11/28/2015 Document Reviewed: 01/09/2015 Elsevier Interactive Patient Education  2017 [place] Prevention in the Home Falls can cause injuries. They can happen to people of all ages. There are many things you can do to make your home safe and to help prevent falls. What can I do on the outside of my home? Regularly fix the edges of walkways and driveways and fix any cracks. Remove anything that might make you trip as you walk through a door, such as a raised step or threshold. Trim any bushes or trees on the path to your home. Use bright outdoor lighting. Clear any walking paths of anything that might make someone trip, such as rocks or tools. Regularly check to see if handrails are loose or broken. Make sure that both sides of any steps have handrails. Any raised decks and porches should have guardrails on the edges. Have any leaves, snow, or ice cleared regularly. Use sand or salt on walking paths during winter. Clean up any spills in your garage right away. This includes oil or grease spills. What can I do in the bathroom? Use night lights. Install grab bars by the toilet and in the tub and shower. Do not use towel bars as grab bars. Use non-skid mats or decals in the tub or shower. If you need to sit down in the shower, use a plastic, non-slip stool. Keep the floor dry. Clean up any water that spills on the floor as soon as it happens. Remove soap buildup in the tub or shower regularly. Attach bath mats securely with double-sided non-slip rug tape. Do not have throw rugs and other things on the floor that can make  you trip. What can I do in the bedroom? Use night lights. Make sure that you have a light by your bed that is easy to reach. Do not use any sheets or blankets that are too big for your bed. They should not hang down onto the floor. Have a firm chair that has side arms. You can use this for support while you get dressed. Do not have throw rugs and other things on the floor that can make you trip. What can I do in the kitchen? Clean up any spills right away. Avoid walking on wet floors. Keep items that you use a lot in easy-to-reach places. If you need to reach something above you, use a strong step stool that has a grab bar. Keep electrical cords out of the way. Do not use floor polish or wax that makes floors slippery. If you must use wax, use non-skid floor wax. Do not have throw rugs and other things on the floor that can make you trip. What can I do with my stairs? Do not leave any items on the stairs. Make sure that there are handrails on both sides of the stairs and use them. Fix handrails that are broken or loose. Make sure that handrails are as long as the stairways. Check any carpeting to make sure that it is firmly attached to the stairs. Fix any carpet that is loose or worn. Avoid having throw rugs at  the top or bottom of the stairs. If you do have throw rugs, attach them to the floor with carpet tape. Make sure that you have a light switch at the top of the stairs and the bottom of the stairs. If you do not have them, ask someone to add them for you. What else can I do to help prevent falls? Wear shoes that: Do not have high heels. Have rubber bottoms. Are comfortable and fit you well. Are closed at the toe. Do not wear sandals. If you use a stepladder: Make sure that it is fully opened. Do not climb a closed stepladder. Make sure that both sides of the stepladder are locked into place. Ask someone to hold it for you, if possible. Clearly mark and make sure that you can  see: Any grab bars or handrails. First and last steps. Where the edge of each step is. Use tools that help you move around (mobility aids) if they are needed. These include: Canes. Walkers. Scooters. Crutches. Turn on the lights when you go into a dark area. Replace any light bulbs as soon as they burn out. Set up your furniture so you have a clear path. Avoid moving your furniture around. If any of your floors are uneven, fix them. If there are any pets around you, be aware of where they are. Review your medicines with your doctor. Some medicines can make you feel dizzy. This can increase your chance of falling. Ask your doctor what other things that you can do to help prevent falls. This information is not intended to replace advice given to you by your health care provider. Make sure you discuss any questions you have with your health care provider. Document Released: 01/04/2009 Document Revised: 08/16/2015 Document Reviewed: 04/14/2014 Elsevier Interactive Patient Education  2017 [employer].

## 2021-12-06 NOTE — Progress Notes (Signed)
Subjective:   Brian [name] is a 88 y.o. male who presents for Medicare Annual/Subsequent preventive examination.  Review of Systems     Cardiac Risk Factors include: advanced age (>6mn, >>41women);diabetes mellitus;male gender;obesity (BMI >30kg/m2);dyslipidemia;smoking/ tobacco exposure     Objective:    Today's Vitals   12/06/21 1034  Weight: 220 lb (99.8 kg)  Height: 6' (1.829 m)   Body mass index is 29.84 kg/m.     07/30/2021   11:19 AM 12/04/2020    3:52 PM 07/17/2020    1:38 PM 05/25/2020   11:44 AM 03/12/2020    2:50 PM 12/22/2019   11:27 AM 11/10/2019   11:30 AM  Advanced Directives  Does Patient Have a Medical Advance Directive? Yes Yes Yes Yes Yes Yes Yes  Type of AIndustrial/product designerof AFreescale SemiconductorPower of Attorney Out of facility DNR (pink MOST or yellow form);Healthcare Power of AL[place]of [place]of [place] Does patient want to make changes to medical advance directive? Yes (MAU/Ambulatory/Procedural Areas - Information given) No - Patient declined No - Patient declined No - Patient declined No - Patient declined No - Patient declined No - Patient declined  Copy of HDungannonin Chart? No - copy requested Yes - validated most recent copy scanned in chart (See row information) No - copy requested Yes - validated most recent copy scanned in chart (See row information)       Current Medications (verified) Outpatient Encounter Medications as of 12/06/2021  Medication Sig   albuterol (VENTOLIN HFA) 108 (90 Base) MCG/ACT inhaler Inhale 1 puff into the lungs as needed for wheezing or shortness of breath.   aspirin 81 MG tablet Take 81 mg by mouth daily.   finasteride (PROSCAR) 5 MG tablet Take 5 mg by mouth daily.   fluticasone (FLONASE) 50 MCG/ACT nasal spray Place 1 spray into both nostrils daily.   furosemide (LASIX) 40 MG tablet  TAKE 1 TABLET BY MOUTH EVERY DAY   JARDIANCE 10 MG TABS tablet TAKE 1 TABLET BY MOUTH EVERY DAY   magnesium hydroxide (MILK OF MAGNESIA) 400 MG/5ML suspension 1/2 capful by mouth every night   metFORMIN (GLUCOPHAGE) 500 MG tablet TAKE 1 TABLET BY MOUTH 2 TIMES DAILY WITH A MEAL.   triamcinolone cream (KENALOG) 0.1 % APPLY TO AFFECTED AREA TWICE A DAY   ACCU-CHEK GUIDE test strip    diclofenac Sodium (VOLTAREN) 1 % GEL Apply 4 g topically 4 (four) times daily. (Patient not taking: Reported on 12/06/2021)   No facility-administered encounter medications on file as of 12/06/2021.    Allergies (verified) Lopressor [metoprolol tartrate] and Beta adrenergic blockers   History: Past Medical History:  Diagnosis Date   Benign prostatic hyperplasia with urinary obstruction    Bladder neck obstruction 02/02/2012   Colon polyps 09/01/2011   Depression    Diabetes mellitus without complication ([facility] [id number]  Dyslipidemia associated with type 2 diabetes mellitus ([facility]    Edema    edema in lower legs   Hyperlipemia    Hypertension 09/01/2011   Kyphosis deformity of spine    Lung disease    [place]    [place]   Past Surgical History:  Procedure Laterality Date   HERNIA REPAIR     1960's   MEDIAL PARTIAL KNEE REPLACEMENT  2015   melanoma removed from back  2008   sub mucus  2015  Family History  Problem Relation Age of Onset   Pancreatic cancer Father    Stomach cancer Sister    Hypertension Maternal Grandmother    Stroke Maternal Grandfather    Heart disease Paternal Grandmother    Social History   Socioeconomic History   Marital status: Widowed    Spouse name: [name]    Number of children: 1   Years of education: 7   Highest education level: Occupational hygienist History   Occupation: Retired Air cabin crew   Tobacco Use   Smoking status: Former    Years: 10.00    Types: Cigarettes    Quit date: 1967    Years since quitting: 56.7   Smokeless tobacco: Never   Vaping Use   Vaping Use: Never used  Substance and Sexual Activity   Alcohol use: Not Currently   Drug use: Never   Sexual activity: Not Currently  Other Topics Concern   Not on file  Social History Narrative   Social History      Diet? Low sodium       Do you drink/eat things with caffeine? No       Marital status?                            Widowed         What year were you married? 1958      Do you live in a house, apartment, assisted living, condo, trailer, etc.? house      Is it one or more stories? One       How many persons live in your home? One       Do you have any pets in your home? (please list) no       Highest level of education completed? Graduate degree divinity [employer]       Current or past profession: [employer]       Do you exercise?               No                        Type & how often?      Advanced Directives      Do you have a living will? yes      Do you have a DNR form?                                  If not, do you want to discuss one? No       Do you have signed POA/HPOA for forms? No       Functional Status      Do you have difficulty bathing or dressing yourself? Yes       Do you have difficulty preparing food or eating? Yes       Do you have difficulty managing your medications? No       Do you have difficulty managing your finances? No       Do you have difficulty affording your medications?no       Social Determinants of Health   Financial Resource Strain: Not on file  Food Insecurity: Not on file  Transportation Needs: Not on file  Physical Activity: Not on file  Stress: Not on file  Social Connections: Not on file    Tobacco Counseling Counseling given:  Not Answered   Clinical Intake:  Pre-visit preparation completed: No  Pain : No/denies pain     BMI - recorded: 35.06 Nutritional Status: BMI > 30  Obese Nutritional Risks: None Diabetes: Yes CBG done?: No Did pt. bring in CBG  monitor from home?: No  How often do you need to have someone help you when you read instructions, pamphlets, or other written materials from your doctor or pharmacy?: 1 - Never What is the last grade level you completed in school?: Divinity school  Diabetic?Yes   Interpreter Needed?: No      Activities of Daily Living    12/06/2021   10:56 AM 12/06/2021   10:31 AM  In your present state of health, do you have any difficulty performing the following activities:  Hearing? 0 0  Vision? 0 0  Difficulty concentrating or making decisions? 0 0  Walking or climbing stairs? 1 1  Comment walks with a Rolator walker  Dressing or bathing? 0 0  Doing errands, shopping? 0 1  Preparing Food and eating ? N   Using the Toilet? Y   Comment sometimes constipation.MOM effective   In the past six months, have you accidently leaked urine? Y   Do you have problems with loss of bowel control? N   Managing your Medications? N   Managing your Finances? N   Housekeeping or managing your Housekeeping? Y   Comment family assit     Patient Care Team: [name], MD as PCP - General (Family Medicine) Myrlene Broker, MD as Attending Physician (Urology) Ralene Bathe, MD (Ophthalmology) [name], MD as Referring Physician  Indicate any recent Medical Services you may have received from other than [facility] providers in the past year (date may be approximate).     Assessment:   This is a routine wellness examination for Marvell.  Hearing/Vision screen No results found.  Dietary issues and exercise activities discussed: Current Exercise Habits: Home exercise routine, Type of exercise: walking, Time (Minutes): 15, Frequency (Times/Week): 5, Weekly Exercise (Minutes/Week): 75, Intensity: Mild, Exercise limited by: Other - see comments (walks with a Rolator)   Goals Addressed             This Visit's Progress    Patient Stated   On track    Work on exercise to gain stability on  gait        Depression Screen    12/06/2021   10:30 AM 07/30/2021   12:50 PM 12/04/2020    3:49 PM 05/20/2019   11:15 AM  PHQ 2/9 Scores  PHQ - 2 Score 1 0 0 0    Fall Risk    12/06/2021   10:30 AM 07/30/2021   12:50 PM 03/26/2021    2:05 PM 12/04/2020    3:49 PM 10/03/2020   11:23 AM  Fall Risk   Falls in the past year? 0 0 0 0 0  Number falls in past yr: 0 0 0 0 0  Injury with Fall? 0 0 0 0 0  Risk for fall due to : No Fall Risks No Fall Risks History of fall(s) No Fall Risks History of fall(s)  Follow up Falls evaluation completed Falls evaluation completed Falls evaluation completed;Education provided;Falls prevention discussed Falls evaluation completed Falls evaluation completed;Education provided;Falls prevention discussed    FALL RISK PREVENTION PERTAINING TO THE HOME:  Any stairs in or around the home? No Has a ramp  If so, are there any without handrails? No  Home  free of loose throw rugs in walkways, pet beds, electrical cords, etc? No  Adequate lighting in your home to reduce risk of falls? Yes   ASSISTIVE DEVICES UTILIZED TO PREVENT FALLS:  Life alert? Yes  Use of a cane, walker or w/c? Yes  Grab bars in the bathroom? Yes  Shower chair or bench in shower? Yes  Elevated toilet seat or a handicapped toilet? Yes   TIMED UP AND GO:  Was the test performed? No .  Length of time to ambulate 10 feet: N/A sec.   Gait slow and steady with assistive device  Cognitive Function:        12/06/2021   10:32 AM 12/04/2020    3:50 PM  6CIT Screen  What Year? 0 points 0 points  What month? 0 points 0 points  What time? 0 points 0 points  Count back from 20 0 points 0 points  Months in reverse 0 points 0 points  Repeat phrase 0 points 0 points  Total Score 0 points 0 points    Immunizations Immunization History  Administered Date(s) Administered   Fluad Quad(high Dose 65+) 12/22/2019   H1N1 03/27/2008, 03/27/2008   Influenza, High Dose Seasonal PF 12/18/2014,  11/26/2016, 12/30/2017, 12/30/2017, 12/27/2018, 12/27/2018   Influenza, Quadrivalent, Recombinant, Inj, Pf 12/29/2012   Influenza,inj,quad, With Preservative 12/30/2017   Influenza,trivalent, recombinat, inj, PF 12/14/2009   Influenza-Unspecified 12/14/2009, 12/30/2017, 12/30/2017, 12/27/2018   Moderna Sars-Covid-2 Vaccination 05/07/2019, 06/04/2019, 01/30/2020   Pneumococcal Conjugate-13 04/20/2013   Pneumococcal Polysaccharide-23 10/27/2006   Tdap 02/08/2015, 02/08/2015, 02/17/2019    TDAP status: Up to date  Flu Vaccine status: Due, Education has been provided regarding the importance of this vaccine. Advised may receive this vaccine at local pharmacy or Health Dept. Aware to provide a copy of the vaccination record if obtained from local pharmacy or Health Dept. Verbalized acceptance and understanding.  Pneumococcal vaccine status: Completed during today's visit.  Covid-19 vaccine status: Information provided on how to obtain vaccines.   Qualifies for Shingles Vaccine? Yes   Zostavax completed No   Shingrix Completed?: No.    Education has been provided regarding the importance of this vaccine. Patient has been advised to call insurance company to determine out of pocket expense if they have not yet received this vaccine. Advised may also receive vaccine at local pharmacy or Health Dept. Verbalized acceptance and understanding.  Screening Tests Health Maintenance  Topic Date Due   Zoster Vaccines- Shingrix (1 of 2) Never done   COVID-19 Vaccine (4 - Moderna risk series) 03/26/2020   FOOT EXAM  07/17/2021   INFLUENZA VACCINE  10/22/2021   HEMOGLOBIN A1C  01/30/2022   OPHTHALMOLOGY EXAM  09/14/2022   TETANUS/TDAP  02/16/2029   Pneumonia Vaccine 75+ Years old  Completed   HPV VACCINES  Aged Out    Health Maintenance  Health Maintenance Due  Topic Date Due   Zoster Vaccines- Shingrix (1 of 2) Never done   COVID-19 Vaccine (4 - Moderna risk series) 03/26/2020   FOOT EXAM   07/17/2021   INFLUENZA VACCINE  10/22/2021    Colorectal cancer screening: No longer required.   Lung Cancer Screening: (Low Dose CT Chest recommended if Age 34-80 years, 30 pack-year currently smoking OR have quit w/in 15years.) does not qualify.   Lung Cancer Screening Referral:No   Additional Screening:  Hepatitis C Screening: does not qualify; Completed No   Vision Screening: Recommended annual ophthalmology exams for early detection of glaucoma and other disorders of the eye.  Is the patient up to date with their annual eye exam?  Yes  Who is the provider or what is the name of the office in which the patient attends annual eye exams? Dr.Tanner  If pt is not established with a provider, would they like to be referred to a provider to establish care? No .   Dental Screening: Recommended annual dental exams for proper oral hygiene  Community Resource Referral / Chronic Care Management: CRR required this visit?  No   CCM required this visit?  No      Plan:     I have personally reviewed and noted the following in the patient's chart:   Medical and social history Use of alcohol, tobacco or illicit drugs  Current medications and supplements including opioid prescriptions. Patient is not currently taking opioid prescriptions. Functional ability and status Nutritional status Physical activity Advanced directives List of other physicians Hospitalizations, surgeries, and ER visits in previous 12 months Vitals Screenings to include cognitive, depression, and falls Referrals and appointments  In addition, I have reviewed and discussed with patient certain preventive protocols, quality metrics, and best practice recommendations. A written personalized care plan for preventive services as well as general preventive health recommendations were provided to patient.     Sandrea Hughs, NP   12/06/2021   Nurse Notes: will get influenza,COVID-19 booster and shingles vaccine at  the pharmacy.

## 2021-12-11 ENCOUNTER — Encounter: Payer: Self-pay | Admitting: Family Medicine

## 2021-12-11 ENCOUNTER — Ambulatory Visit (INDEPENDENT_AMBULATORY_CARE_PROVIDER_SITE_OTHER): Payer: Medicare (Managed Care) | Admitting: Family Medicine

## 2021-12-11 VITALS — BP 132/70 | HR 92 | Temp 97.7°F | Ht 72.0 in | Wt 232.0 lb

## 2021-12-11 DIAGNOSIS — E11610 Type 2 diabetes mellitus with diabetic neuropathic arthropathy: Secondary | ICD-10-CM

## 2021-12-11 DIAGNOSIS — I10 Essential (primary) hypertension: Secondary | ICD-10-CM

## 2021-12-11 DIAGNOSIS — I872 Venous insufficiency (chronic) (peripheral): Secondary | ICD-10-CM

## 2021-12-11 DIAGNOSIS — F419 Anxiety disorder, unspecified: Secondary | ICD-10-CM

## 2021-12-11 DIAGNOSIS — G629 Polyneuropathy, unspecified: Secondary | ICD-10-CM

## 2021-12-11 DIAGNOSIS — Z6835 Body mass index (BMI) 35.0-35.9, adult: Secondary | ICD-10-CM

## 2021-12-11 MED ORDER — ALPRAZOLAM 0.25 MG PO TABS: 0.2500 mg | ORAL_TABLET | Freq: Two times a day (BID) | ORAL | 0 refills | Status: DC | PRN

## 2021-12-11 MED ORDER — POTASSIUM CHLORIDE CRYS ER 20 MEQ PO TBCR: 20.0000 meq | EXTENDED_RELEASE_TABLET | Freq: Every day | ORAL | 3 refills | Status: DC

## 2021-12-11 NOTE — Progress Notes (Signed)
Provider:  Alain Honey, MD  Careteam: Patient Care Team: [name], MD as PCP - General (Family Medicine) Myrlene Broker, MD as Attending Physician (Urology) Ralene Bathe, MD (Ophthalmology) [name], MD as Referring Physician  PLACE OF SERVICE:  [place]  Advanced Directive information    Allergies  Allergen Reactions   Lopressor [Metoprolol Tartrate]     Mental status changes (intolerance)    Beta Adrenergic Blockers     "they make me depressed"    Chief Complaint  Patient presents with   Medical Management of Chronic Issues    Patient presents today for a 4 month follow-up.   Quality Metric Gaps    Foot exam, zoster, COVID#4     HPI: Patient is a 88 y.o. male .  Patient continues to struggle with dependent edema secondary to lymphedema, diabetes, neuropathy,.  He also now has a son who is dying with cancer and enrolled in hospice.  At his last visit he informed me that he might need something to help him deal with this chapter in his life and today he said he is willing to at least consider medication if he needs it. He continues to have dependent edema.  He takes Lasix 40 mg a day uses support stockings and elevation.  There have been some blisters that have ruptured recently.  He uses Silvadene on these although there has not been any overt infection.  He continues to have stasis dermatitis and has triamcinolone as well as a general lotion to use for the redness.  Renal function has been good.  There have not been any issues with hypokalemia. Last A1c was 7.2 on combination of metformin and Jardiance.  Review of Systems:  Review of Systems  Constitutional: Negative.   Respiratory: Negative.    Cardiovascular:  Positive for leg swelling.  Genitourinary: Negative.   Musculoskeletal: Negative.   Skin:  Positive for rash.       Stasis dermatitis on both lower extremities  Neurological: Negative.   Endo/Heme/Allergies: Negative.    Psychiatric/Behavioral:  The patient is nervous/anxious.   All other systems reviewed and are negative.   Past Medical History:  Diagnosis Date   Benign prostatic hyperplasia with urinary obstruction    Bladder neck obstruction 02/02/2012   Colon polyps 09/01/2011   Depression    Diabetes mellitus without complication ([place]) 01/15/8526   Dyslipidemia associated with type 2 diabetes mellitus ([place])    Edema    edema in lower legs   Hyperlipemia    Hypertension 09/01/2011   Kyphosis deformity of spine    Lung disease    Melanoma ([place])    on back   Past Surgical History:  Procedure Laterality Date   HERNIA REPAIR     1960's   MEDIAL PARTIAL KNEE REPLACEMENT  2015   melanoma removed from back  2008   sub mucus  2015   Social History:   reports that he quit smoking about 56 years ago. His smoking use included cigarettes. He has never used smokeless tobacco. He reports that he does not currently use alcohol. He reports that he does not use drugs.  Family History  Problem Relation Age of Onset   Pancreatic cancer Father    Stomach cancer Sister    Hypertension Maternal Grandmother    Stroke Maternal Grandfather    Heart disease Paternal Grandmother     Medications: Patient's Medications  New Prescriptions   No medications on file  Previous Medications  ACCU-CHEK GUIDE TEST STRIP       ALBUTEROL (VENTOLIN HFA) 108 (90 BASE) MCG/ACT INHALER    Inhale 1 puff into the lungs as needed for wheezing or shortness of breath.   ASPIRIN 81 MG TABLET    Take 81 mg by mouth daily.   DICLOFENAC SODIUM (VOLTAREN) 1 % GEL    Apply 4 g topically 4 (four) times daily.   FINASTERIDE (PROSCAR) 5 MG TABLET    Take 5 mg by mouth daily.   FLUTICASONE (FLONASE) 50 MCG/ACT NASAL SPRAY    Place 1 spray into both nostrils daily.   FUROSEMIDE (LASIX) 40 MG TABLET    TAKE 1 TABLET BY MOUTH EVERY DAY   JARDIANCE 10 MG TABS TABLET    TAKE 1 TABLET BY MOUTH EVERY DAY   MAGNESIUM HYDROXIDE (MILK OF  MAGNESIA) 400 MG/5ML SUSPENSION    1/2 capful by mouth every night   METFORMIN (GLUCOPHAGE) 500 MG TABLET    TAKE 1 TABLET BY MOUTH 2 TIMES DAILY WITH A MEAL.   TRIAMCINOLONE CREAM (KENALOG) 0.1 %    APPLY TO AFFECTED AREA TWICE A DAY  Modified Medications   No medications on file  Discontinued Medications   No medications on file    Physical Exam:  Vitals:   12/11/21 1355  Weight: 232 lb (105.2 kg)  Height: 6' (1.829 m)   Body mass index is 31.46 kg/m. Wt Readings from Last 3 Encounters:  12/11/21 232 lb (105.2 kg)  12/06/21 220 lb (99.8 kg)  10/16/21 237 lb 6.4 oz (107.7 kg)    Physical Exam Vitals and nursing note reviewed.  Constitutional:      Appearance: He is obese.  Cardiovascular:     Rate and Rhythm: Normal rate and regular rhythm.  Pulmonary:     Effort: Pulmonary effort is normal.     Breath sounds: Normal breath sounds.  Abdominal:     General: Abdomen is flat. Bowel sounds are normal.  Skin:    Comments: Skin present on lower extremities.  There is some small blisters but no sign of infection  Neurological:     General: No focal deficit present.     Mental Status: He is alert and oriented to person, place, and time.  Psychiatric:        Mood and Affect: Mood normal.        Behavior: Behavior normal.     Labs reviewed: Basic Metabolic Panel: Recent Labs    07/30/21 1341  NA 140  K 4.5  CL 101  CO2 31  GLUCOSE 151*  BUN 24  CREATININE 0.94  CALCIUM 9.8   Liver Function Tests: No results for input(s): "AST", "ALT", "ALKPHOS", "BILITOT", "PROT", "ALBUMIN" in the last 8760 hours. No results for input(s): "LIPASE", "AMYLASE" in the last 8760 hours. No results for input(s): "AMMONIA" in the last 8760 hours. CBC: No results for input(s): "WBC", "NEUTROABS", "HGB", "HCT", "MCV", "PLT" in the last 8760 hours. Lipid Panel: No results for input(s): "CHOL", "HDL", "LDLCALC", "TRIG", "CHOLHDL", "LDLDIRECT" in the last 8760 hours. TSH: No results  for input(s): "TSH" in the last 8760 hours. A1C: Lab Results  Component Value Date   HGBA1C 7.2 (H) 07/30/2021     Assessment/Plan  1. Type 2 diabetes mellitus with diabetic neuropathic arthropathy, without long-term current use of insulin ([place]) Patient does endorse some eating of sweets and carbs lately.  Would expect A1c to be reflective but he knows proper diet  2. Anxiety As he deals  with the impending death of his son we will give him Xanax 0.25 mg #12 to take as needed  3. Chronic venous insufficiency Continue with Lasix support  stockings and elevation.  Added KCl supplement  4. Class 2 severe obesity due to excess calories with serious comorbidity and body mass index (BMI) of 35.0 to 35.9 in adult [facility]) Patient does enjoy eating unable to exercise but encouraged him to ambulate some with his walker to help the edema  5. Essential hypertension Blood pressure today is good at 132/70.  We will use Lasix to treat his edema as well as blood pressure  6. Neuropathy He has declined use of gabapentin.  Still retains sensation in his feet    Alain Honey, MD [facility] [phone]

## 2021-12-12 LAB — BASIC METABOLIC PANEL WITH GFR
BUN: 17 mg/dL (ref 7–25)
CO2: 29 mmol/L (ref 20–32)
Calcium: 9.6 mg/dL (ref 8.6–10.3)
Chloride: 101 mmol/L (ref 98–110)
Creat: 0.93 mg/dL (ref 0.70–1.22)
Glucose, Bld: 294 mg/dL — ABNORMAL HIGH (ref 65–139)
Potassium: 4.6 mmol/L (ref 3.5–5.3)
Sodium: 138 mmol/L (ref 135–146)
eGFR: 79 mL/min/{1.73_m2} (ref >=60)

## 2021-12-12 LAB — HEMOGLOBIN A1C
Hgb A1c MFr Bld: 7.5 % of total Hgb — ABNORMAL HIGH (ref <5.7)
Mean Plasma Glucose: 169 mg/dL
eAG (mmol/L): 9.3 mmol/L

## 2021-12-17 DIAGNOSIS — L821 Other seborrheic keratosis: Secondary | ICD-10-CM | POA: Diagnosis not present

## 2021-12-17 DIAGNOSIS — L814 Other melanin hyperpigmentation: Secondary | ICD-10-CM | POA: Diagnosis not present

## 2021-12-17 DIAGNOSIS — C44629 Squamous cell carcinoma of skin of left upper limb, including shoulder: Secondary | ICD-10-CM | POA: Diagnosis not present

## 2021-12-17 DIAGNOSIS — D225 Melanocytic nevi of trunk: Secondary | ICD-10-CM | POA: Diagnosis not present

## 2021-12-17 DIAGNOSIS — D0462 Carcinoma in situ of skin of left upper limb, including shoulder: Secondary | ICD-10-CM | POA: Diagnosis not present

## 2021-12-17 DIAGNOSIS — Z08 Encounter for follow-up examination after completed treatment for malignant neoplasm: Secondary | ICD-10-CM | POA: Diagnosis not present

## 2021-12-17 DIAGNOSIS — Z85828 Personal history of other malignant neoplasm of skin: Secondary | ICD-10-CM | POA: Diagnosis not present

## 2021-12-17 DIAGNOSIS — Z85820 Personal history of malignant melanoma of skin: Secondary | ICD-10-CM | POA: Diagnosis not present

## 2022-03-15 ENCOUNTER — Other Ambulatory Visit: Payer: Self-pay | Admitting: Family Medicine

## 2022-03-15 DIAGNOSIS — I872 Venous insufficiency (chronic) (peripheral): Secondary | ICD-10-CM

## 2022-04-16 ENCOUNTER — Ambulatory Visit (INDEPENDENT_AMBULATORY_CARE_PROVIDER_SITE_OTHER): Payer: Medicare (Managed Care) | Admitting: Family Medicine

## 2022-04-16 ENCOUNTER — Encounter: Payer: Self-pay | Admitting: Family Medicine

## 2022-04-16 VITALS — BP 144/90 | HR 87 | Temp 97.1°F | Ht 72.0 in | Wt 231.0 lb

## 2022-04-16 DIAGNOSIS — E11610 Type 2 diabetes mellitus with diabetic neuropathic arthropathy: Secondary | ICD-10-CM | POA: Diagnosis not present

## 2022-04-16 DIAGNOSIS — Z23 Encounter for immunization: Secondary | ICD-10-CM

## 2022-04-16 DIAGNOSIS — I890 Lymphedema, not elsewhere classified: Secondary | ICD-10-CM | POA: Diagnosis not present

## 2022-04-16 DIAGNOSIS — Z6835 Body mass index (BMI) 35.0-35.9, adult: Secondary | ICD-10-CM

## 2022-04-16 NOTE — Progress Notes (Signed)
Provider:  Alain Honey, MD  Careteam: Patient Care Team: [name], MD as PCP - General (Family Medicine) Myrlene Broker, MD as Attending Physician (Urology) Ralene Bathe, MD (Ophthalmology) [name], MD as Referring Physician  PLACE OF SERVICE:  [place]  Advanced Directive information    Allergies  Allergen Reactions   Lopressor [Metoprolol Tartrate]     Mental status changes (intolerance)    Beta Adrenergic Blockers     "they make me depressed"    Chief Complaint  Patient presents with   Medical Management of Chronic Issues    Patient presents today for a 4 month follow-up   Quality Metric Gaps    Foot exam, zoster, COVID#4     HPI: Patient is a 89 y.o. male patient returns for usual 40-monthcheck.  He is concerned about swelling in his right leg.  He continues to wear support walking stocking, elevation and trying to walk.  He does admit to eating more salt recently and has been off his Lasix for about 3 days.  He denies any shortness of breath. Also has concern about on an area on his posterior right upper arm.  Had a similar area on his left arm which was skin cancer.  Review of Systems:  Review of Systems  Constitutional: Negative.   HENT: Negative.    Respiratory: Negative.    Cardiovascular:  Positive for leg swelling.  Genitourinary:  Positive for frequency.  Musculoskeletal: Negative.   Skin: Negative.   Neurological: Negative.   Psychiatric/Behavioral: Negative.      Past Medical History:  Diagnosis Date   Benign prostatic hyperplasia with urinary obstruction    Bladder neck obstruction 02/02/2012   Colon polyps 09/01/2011   Depression    Diabetes mellitus without complication ([place] 054/27/0623  Dyslipidemia associated with type 2 diabetes mellitus ([place]    Edema    edema in lower legs   Hyperlipemia    Hypertension 09/01/2011   Kyphosis deformity of spine    Lung disease    [place]    Past Surgical History:  Procedure Laterality Date   HERNIA REPAIR     1960's   MEDIAL PARTIAL KNEE REPLACEMENT  2015   melanoma removed from back  2008   sub mucus  2015   Social History:   reports that he quit smoking about 57 years ago. His smoking use included cigarettes. He has never used smokeless tobacco. He reports that he does not currently use alcohol. He reports that he does not use drugs.  Family History  Problem Relation Age of Onset   Pancreatic cancer Father    Stomach cancer Sister    Hypertension Maternal Grandmother    Stroke Maternal Grandfather    Heart disease Paternal Grandmother     Medications: Patient's Medications  New Prescriptions   No medications on file  Previous Medications   ACCU-CHEK GUIDE TEST STRIP       ALBUTEROL (VENTOLIN HFA) 108 (90 BASE) MCG/ACT INHALER    Inhale 1 puff into the lungs as needed for wheezing or shortness of breath.   ALPRAZOLAM (XANAX) 0.25 MG TABLET    Take 1 tablet (0.25 mg total) by mouth 2 (two) times daily as needed for anxiety.   ASPIRIN 81 MG TABLET    Take 81 mg by mouth daily.   DICLOFENAC SODIUM (VOLTAREN) 1 % GEL    Apply 4 g topically 4 (four) times daily.  FINASTERIDE (PROSCAR) 5 MG TABLET    Take 5 mg by mouth daily.   FLUTICASONE (FLONASE) 50 MCG/ACT NASAL SPRAY    Place 1 spray into both nostrils daily.   FUROSEMIDE (LASIX) 40 MG TABLET    TAKE 1 TABLET BY MOUTH EVERY DAY   JARDIANCE 10 MG TABS TABLET    TAKE 1 TABLET BY MOUTH EVERY DAY   KLOR-CON M20 20 MEQ TABLET    TAKE 1 TABLET BY MOUTH EVERY DAY   MAGNESIUM HYDROXIDE (MILK OF MAGNESIA) 400 MG/5ML SUSPENSION    1/2 capful by mouth every night   METFORMIN (GLUCOPHAGE) 500 MG TABLET    TAKE 1 TABLET BY MOUTH 2 TIMES DAILY WITH A MEAL.   TRIAMCINOLONE CREAM (KENALOG) 0.1 %    APPLY TO AFFECTED AREA TWICE A DAY  Modified Medications   No medications on file  Discontinued Medications   No medications on file    Physical Exam:  Vitals:   04/16/22  1056  BP: (!) 144/90  Pulse: 87  Temp: (!) 97.1 F (36.2 C)  SpO2: 96%  Weight: 231 lb (104.8 kg)  Height: 6' (1.829 m)   Body mass index is 31.33 kg/m. Wt Readings from Last 3 Encounters:  04/16/22 231 lb (104.8 kg)  12/11/21 232 lb (105.2 kg)  12/06/21 220 lb (99.8 kg)    Physical Exam Vitals and nursing note reviewed.  Constitutional:      Appearance: Normal appearance.  Cardiovascular:     Rate and Rhythm: Normal rate and regular rhythm.  Pulmonary:     Effort: Pulmonary effort is normal.     Breath sounds: Normal breath sounds.  Musculoskeletal:     Right lower leg: Edema present.  Neurological:     General: No focal deficit present.     Mental Status: He is alert and oriented to person, place, and time.     Labs reviewed: Basic Metabolic Panel: Recent Labs    07/30/21 1341 12/11/21 1448  NA 140 138  K 4.5 4.6  CL 101 101  CO2 31 29  GLUCOSE 151* 294*  BUN 24 17  CREATININE 0.94 0.93  CALCIUM 9.8 9.6   Liver Function Tests: No results for input(s): "AST", "ALT", "ALKPHOS", "BILITOT", "PROT", "ALBUMIN" in the last 8760 hours. No results for input(s): "LIPASE", "AMYLASE" in the last 8760 hours. No results for input(s): "AMMONIA" in the last 8760 hours. CBC: No results for input(s): "WBC", "NEUTROABS", "HGB", "HCT", "MCV", "PLT" in the last 8760 hours. Lipid Panel: No results for input(s): "CHOL", "HDL", "LDLCALC", "TRIG", "CHOLHDL", "LDLDIRECT" in the last 8760 hours. TSH: No results for input(s): "TSH" in the last 8760 hours. A1C: Lab Results  Component Value Date   HGBA1C 7.5 (H) 12/11/2021     Assessment/Plan  1. Type 2 diabetes mellitus with diabetic neuropathic arthropathy, without long-term current use of insulin ([place]) Patient continues with metformin and Jardiance.  Diet is pretty good.  Last A1c was 7.5 which is I think acceptable given his age  49. Need for influenza vaccination Flu shot given  3. Class 2 severe obesity due to  excess calories with serious comorbidity and body mass index (BMI) of 35.0 to 35.9 in adult ([facility]) Weight is stable.  Not likely to be able to lose weight  4. Lymphedema of both lower extremities We will get him back on Lasix and potassium supplements and continue with elevation stocking watching salt   Alain Honey, MD [facility] [phone]

## 2022-04-17 LAB — HEMOGLOBIN A1C
Hgb A1c MFr Bld: 7.4 % of total Hgb — ABNORMAL HIGH (ref <5.7)
Mean Plasma Glucose: 166 mg/dL
eAG (mmol/L): 9.2 mmol/L

## 2022-04-25 ENCOUNTER — Telehealth: Payer: Self-pay

## 2022-04-25 NOTE — Telephone Encounter (Signed)
Patient is calling because he has some bruising around his injection site that he received last week. Patient states there is no pain but he noticed it this morning after getting out the shower

## 2022-05-14 ENCOUNTER — Encounter: Payer: Self-pay | Admitting: Family Medicine

## 2022-05-14 ENCOUNTER — Ambulatory Visit (INDEPENDENT_AMBULATORY_CARE_PROVIDER_SITE_OTHER): Payer: Medicare (Managed Care) | Admitting: Family Medicine

## 2022-05-14 VITALS — BP 126/78 | HR 74 | Temp 97.5°F | Ht 72.0 in | Wt 227.0 lb

## 2022-05-14 DIAGNOSIS — I872 Venous insufficiency (chronic) (peripheral): Secondary | ICD-10-CM | POA: Diagnosis not present

## 2022-05-14 DIAGNOSIS — I890 Lymphedema, not elsewhere classified: Secondary | ICD-10-CM

## 2022-05-14 MED ORDER — POTASSIUM CHLORIDE CRYS ER 20 MEQ PO TBCR: 20.0000 meq | EXTENDED_RELEASE_TABLET | Freq: Every day | ORAL | 1 refills | Status: DC

## 2022-05-14 MED ORDER — MUPIROCIN 2 % EX OINT: TOPICAL_OINTMENT | Freq: Two times a day (BID) | CUTANEOUS | Status: DC

## 2022-05-14 MED ORDER — TRIAMCINOLONE ACETONIDE 0.1 % EX CREA: TOPICAL_CREAM | CUTANEOUS | 2 refills | Status: AC

## 2022-05-14 NOTE — Progress Notes (Signed)
Provider:  Alain Honey, MD  Careteam: Patient Care Team: [name], MD as PCP - General (Family Medicine) Myrlene Broker, MD as Attending Physician (Urology) Ralene Bathe, MD (Ophthalmology) [name], MD as Referring Physician  PLACE OF SERVICE:  [place] Directive information Does Patient Have a Medical Advance Directive?: Yes, Type of Advance Directive: [place];Living will, Does patient want to make changes to medical advance directive?: No - Patient declined  Allergies  Allergen Reactions   Lopressor [Metoprolol Tartrate]     Mental status changes (intolerance)    Beta Adrenergic Blockers     "they make me depressed"    Chief Complaint  Patient presents with   Medical Management of Chronic Issues    Patient presents today for a 1 month follow-up. Patient not sure if he is taking potassium. Patient would like to discuss leg swelling, discoloration, and lesions. Patient is also requesting dietary recommendations. Patient would like insight about cream he is using on legs. Here with daughter-in-law, Juliann Pulse    Quality Metric Gaps    Foot exam, zoster, COVID#4     HPI: Patient is a 89 y.o. male this gentleman presents with bilateral lymphedema and legs.  Right is much worse with swelling erythema and weeping.  In the past he has used TED stockings another rapid elevation but admitted his last visit to eating more salted usual. There is no pain but there are several open areas and areas that have wet.  Review of Systems:  Review of Systems  Cardiovascular:  Positive for leg swelling.  All other systems reviewed and are negative.   Past Medical History:  Diagnosis Date   Benign prostatic hyperplasia with urinary obstruction    Bladder neck obstruction 02/02/2012   Colon polyps 09/01/2011   Depression    Diabetes mellitus without complication ([place]) 99991111   Dyslipidemia associated with type 2 diabetes  mellitus ([place])    Edema    edema in lower legs   Hyperlipemia    Hypertension 09/01/2011   Kyphosis deformity of spine    Lung disease    Melanoma ([place])    on back   Past Surgical History:  Procedure Laterality Date   HERNIA REPAIR     1960's   MEDIAL PARTIAL KNEE REPLACEMENT  2015   melanoma removed from back  2008   sub mucus  2015   Social History:   reports that he quit smoking about 57 years ago. His smoking use included cigarettes. He has never used smokeless tobacco. He reports that he does not currently use alcohol. He reports that he does not use drugs.  Family History  Problem Relation Age of Onset   Pancreatic cancer Father    Stomach cancer Sister    Hypertension Maternal Grandmother    Stroke Maternal Grandfather    Heart disease Paternal Grandmother     Medications: Patient's Medications  New Prescriptions   No medications on file  Previous Medications   ACCU-CHEK GUIDE TEST STRIP       ALBUTEROL (VENTOLIN HFA) 108 (90 BASE) MCG/ACT INHALER    Inhale 1 puff into the lungs as needed for wheezing or shortness of breath.   ALPRAZOLAM (XANAX) 0.25 MG TABLET    Take 1 tablet (0.25 mg total) by mouth 2 (two) times daily as needed for anxiety.   ASPIRIN 81 MG TABLET    Take 81 mg by mouth daily.   FINASTERIDE (PROSCAR) 5 MG TABLET  Take 5 mg by mouth daily.   FLUTICASONE (FLONASE) 50 MCG/ACT NASAL SPRAY    Place 1 spray into both nostrils daily.   FUROSEMIDE (LASIX) 40 MG TABLET    TAKE 1 TABLET BY MOUTH EVERY DAY   JARDIANCE 10 MG TABS TABLET    TAKE 1 TABLET BY MOUTH EVERY DAY   KLOR-CON M20 20 MEQ TABLET    TAKE 1 TABLET BY MOUTH EVERY DAY   MAGNESIUM HYDROXIDE (MILK OF MAGNESIA) 400 MG/5ML SUSPENSION    1/2 capful by mouth every night   METFORMIN (GLUCOPHAGE) 500 MG TABLET    TAKE 1 TABLET BY MOUTH 2 TIMES DAILY WITH A MEAL.   SILVER SULFADIAZINE (SILVADENE) 1 % CREAM    Apply 1 Application topically as needed. To legs for lesions   TRIAMCINOLONE CREAM  (KENALOG) 0.1 %    APPLY TO AFFECTED AREA TWICE A DAY  Modified Medications   No medications on file  Discontinued Medications   DICLOFENAC SODIUM (VOLTAREN) 1 % GEL    Apply 4 g topically 4 (four) times daily.    Physical Exam:  Vitals:   05/14/22 1240  BP: 126/78  Pulse: 74  Temp: (!) 97.5 F (36.4 C)  TempSrc: Temporal  SpO2: 95%  Weight: 227 lb (103 kg)  Height: 6' (1.829 m)   Body mass index is 30.79 kg/m. Wt Readings from Last 3 Encounters:  05/14/22 227 lb (103 kg)  04/16/22 231 lb (104.8 kg)  12/11/21 232 lb (105.2 kg)    Physical Exam Vitals and nursing note reviewed.  Constitutional:      Appearance: He is obese.  Cardiovascular:     Rate and Rhythm: Regular rhythm.  Pulmonary:     Breath sounds: Normal breath sounds.  Musculoskeletal:     Right lower leg: Edema present.     Left lower leg: Edema present.     Comments: Right leg is 4+ swollen with several open areas.  I think the redness is related to stasis dermatitis as opposed to DVT. Today I wrapped beginning of the foot up to just below the knee with an Unna boot and then applied Ace for compression on top of that.  I have asked him to leave this in place for the next 3 days and remove it.  Neurological:     Mental Status: He is alert.     Labs reviewed: Basic Metabolic Panel: Recent Labs    07/30/21 1341 12/11/21 1448  NA 140 138  K 4.5 4.6  CL 101 101  CO2 31 29  GLUCOSE 151* 294*  BUN 24 17  CREATININE 0.94 0.93  CALCIUM 9.8 9.6   Liver Function Tests: No results for input(s): "AST", "ALT", "ALKPHOS", "BILITOT", "PROT", "ALBUMIN" in the last 8760 hours. No results for input(s): "LIPASE", "AMYLASE" in the last 8760 hours. No results for input(s): "AMMONIA" in the last 8760 hours. CBC: No results for input(s): "WBC", "NEUTROABS", "HGB", "HCT", "MCV", "PLT" in the last 8760 hours. Lipid Panel: No results for input(s): "CHOL", "HDL", "LDLCALC", "TRIG", "CHOLHDL", "LDLDIRECT" in the  last 8760 hours. TSH: No results for input(s): "TSH" in the last 8760 hours. A1C: Lab Results  Component Value Date   HGBA1C 7.4 (H) 04/16/2022     Assessment/Plan  !.1.  Lymphedema See plan as outlined above in addition will increase Lasix to 80 mg a day for 3 days and add potassium supplement.  I have asked him to call me the first of next week if problem  is not much improved    Alain Honey, MD [facility] [phone]

## 2022-05-14 NOTE — Patient Instructions (Signed)
Keep legs elevated except when you're walking Take 2 lasix every AM for 3 days Remove current wrap on Saturday

## 2022-05-25 ENCOUNTER — Other Ambulatory Visit: Payer: Self-pay | Admitting: Family Medicine

## 2022-05-26 ENCOUNTER — Encounter: Payer: Self-pay | Admitting: Adult Health

## 2022-05-26 ENCOUNTER — Ambulatory Visit (INDEPENDENT_AMBULATORY_CARE_PROVIDER_SITE_OTHER): Payer: Medicare (Managed Care) | Admitting: Adult Health

## 2022-05-26 VITALS — BP 136/80 | HR 88 | Temp 97.8°F | Resp 18 | Ht 72.0 in | Wt 227.0 lb

## 2022-05-26 DIAGNOSIS — Z6830 Body mass index (BMI) 30.0-30.9, adult: Secondary | ICD-10-CM

## 2022-05-26 DIAGNOSIS — Z9181 History of falling: Secondary | ICD-10-CM | POA: Diagnosis not present

## 2022-05-26 DIAGNOSIS — F5101 Primary insomnia: Secondary | ICD-10-CM | POA: Diagnosis not present

## 2022-05-26 DIAGNOSIS — E11610 Type 2 diabetes mellitus with diabetic neuropathic arthropathy: Secondary | ICD-10-CM | POA: Diagnosis not present

## 2022-05-26 DIAGNOSIS — I890 Lymphedema, not elsewhere classified: Secondary | ICD-10-CM

## 2022-05-26 DIAGNOSIS — E669 Obesity, unspecified: Secondary | ICD-10-CM

## 2022-05-26 LAB — POCT URINALYSIS DIPSTICK
Appearance: NORMAL
Bilirubin, UA: NEGATIVE
Blood, UA: NEGATIVE
Glucose, UA: POSITIVE — AB
Ketones, UA: 5
Leukocytes, UA: NEGATIVE
Nitrite, UA: NEGATIVE
Protein, UA: NEGATIVE
Spec Grav, UA: 1.01 (ref 1.010–1.025)
Urobilinogen, UA: 0.2 E.U./dL
pH, UA: 6.5 (ref 5.0–8.0)

## 2022-05-26 MED ORDER — ALPRAZOLAM 0.25 MG PO TABS: 0.2500 mg | ORAL_TABLET | Freq: Every evening | ORAL | 0 refills | Status: DC | PRN

## 2022-05-26 NOTE — Progress Notes (Signed)
[name]  Provider:  Durenda Age DNP  Code Status:  Full Code Goals of Care:     05/14/2022   12:39 PM  Advanced Directives  Does Patient Have a Medical Advance Directive? Yes  Type of Paramedic of [place];Living will  Does patient want to make changes to medical advance directive? No - Patient declined  Copy of [place] in Chart? Yes - validated most recent copy scanned in chart (See row information)     Chief Complaint  Patient presents with   Acute Visit    Patient is being seen for a slip and fall in shower this morning    HPI: Patient is a 89 y.o. male seen today for an acute visit S/P fall in shower today. He was last seen at [facility] on 05/14/22 for lymphedema. Lasix was increased to 80 mg per day for 3 days.He came today for evaluation post fall at home, in the shower. He was able to get up on his own.Marland Kitchen He sustained a mild abrasion in his mid upper back. He is able to move X 4 extremities without difficulty. He was accompanied today by a friend and daughter-in-law. He takes Xanax 0.25 mg Q HS PRN for insomnia. Marland Kitchen He takes Finasteride for BPH. He denies increase in urinary frequency.  Wt Readings from Last 3 Encounters:  05/26/22 227 lb (103 kg)  05/14/22 227 lb (103 kg)  04/16/22 231 lb (104.8 kg)     Past Medical History:  Diagnosis Date   Benign prostatic hyperplasia with urinary obstruction    Bladder neck obstruction 02/02/2012   Colon polyps 09/01/2011   Depression    Diabetes mellitus without complication ([place]) 99991111   Dyslipidemia associated with type 2 diabetes mellitus ([place])    Edema    edema in lower legs   Hyperlipemia    Hypertension 09/01/2011   Kyphosis deformity of spine    Lung disease    Melanoma ([place])    on back    Past Surgical History:  Procedure Laterality Date   HERNIA REPAIR     1960's   MEDIAL PARTIAL KNEE REPLACEMENT  2015   melanoma removed from back  2008   sub mucus   2015    Allergies  Allergen Reactions   Lopressor [Metoprolol Tartrate]     Mental status changes (intolerance)    Beta Adrenergic Blockers     "they make me depressed"    Outpatient Encounter Medications as of 05/26/2022  Medication Sig   ACCU-CHEK GUIDE test strip    albuterol (VENTOLIN HFA) 108 (90 Base) MCG/ACT inhaler Inhale 1 puff into the lungs as needed for wheezing or shortness of breath.   aspirin 81 MG tablet Take 81 mg by mouth daily.   finasteride (PROSCAR) 5 MG tablet Take 5 mg by mouth daily.   fluticasone (FLONASE) 50 MCG/ACT nasal spray Place 1 spray into both nostrils daily.   furosemide (LASIX) 40 MG tablet TAKE 1 TABLET BY MOUTH EVERY DAY   JARDIANCE 10 MG TABS tablet TAKE 1 TABLET BY MOUTH EVERY DAY   magnesium hydroxide (MILK OF MAGNESIA) 400 MG/5ML suspension 1/2 capful by mouth every night   metFORMIN (GLUCOPHAGE) 500 MG tablet TAKE 1 TABLET BY MOUTH TWICE A DAY WITH A MEAL   potassium chloride SA (KLOR-CON M20) 20 MEQ tablet Take 1 tablet (20 mEq total) by mouth daily.   silver sulfADIAZINE (SILVADENE) 1 % cream Apply 1 Application topically as needed. To  legs for lesions   triamcinolone cream (KENALOG) 0.1 % APPLY TO AFFECTED AREA TWICE A DAY   [DISCONTINUED] ALPRAZolam (XANAX) 0.25 MG tablet Take 1 tablet (0.25 mg total) by mouth 2 (two) times daily as needed for anxiety.   ALPRAZolam (XANAX) 0.25 MG tablet Take 1 tablet (0.25 mg total) by mouth at bedtime as needed for anxiety.   Facility-Administered Encounter Medications as of 05/26/2022  Medication   mupirocin ointment (BACTROBAN) 2 %    Review of Systems:  Review of Systems  Constitutional:  Negative for activity change, appetite change and fever.  HENT:  Negative for sore throat.   Eyes: Negative.   Cardiovascular:  Positive for leg swelling. Negative for chest pain.  Gastrointestinal:  Negative for abdominal distention, diarrhea and vomiting.  Genitourinary:  Negative for dysuria, frequency and  urgency.  Skin:  Positive for wound. Negative for color change.  Neurological:  Negative for dizziness and headaches.  Psychiatric/Behavioral:  Negative for behavioral problems and sleep disturbance. The patient is not nervous/anxious.     Health Maintenance  Topic Date Due   Zoster Vaccines- Shingrix (1 of 2) Never done   FOOT EXAM  07/17/2021   COVID-19 Vaccine (4 - 2023-24 season) 11/22/2021   OPHTHALMOLOGY EXAM  09/14/2022   HEMOGLOBIN A1C  10/15/2022   Medicare Annual Wellness (AWV)  12/07/2022   DTaP/Tdap/Td (4 - Td or Tdap) 02/16/2029   Pneumonia Vaccine 51+ Years old  Completed   INFLUENZA VACCINE  Completed   HPV VACCINES  Aged Out    Physical Exam: Vitals:   05/26/22 1339  BP: 136/80  Pulse: 88  Resp: 18  Temp: 97.8 F (36.6 C)  SpO2: 93%  Weight: 227 lb (103 kg)  Height: 6' (1.829 m)   Body mass index is 30.79 kg/m. Physical Exam Constitutional:      Appearance: He is obese.  HENT:     Head: Normocephalic and atraumatic.     Mouth/Throat:     Mouth: Mucous membranes are moist.  Eyes:     Conjunctiva/sclera: Conjunctivae normal.  Cardiovascular:     Rate and Rhythm: Normal rate and regular rhythm.     Pulses: Normal pulses.     Heart sounds: Normal heart sounds.  Pulmonary:     Effort: Pulmonary effort is normal.     Breath sounds: Normal breath sounds.  Abdominal:     General: Bowel sounds are normal.     Palpations: Abdomen is soft.  Musculoskeletal:        General: Swelling present. Normal range of motion.     Cervical back: Normal range of motion.     Right lower leg: Edema present.     Left lower leg: Edema present.     Comments: RLE 3+edema  Skin:    General: Skin is warm and dry.     Comments: Abrasion on mid upper back  Neurological:     General: No focal deficit present.     Mental Status: He is alert and oriented to person, place, and time.     Comments: Right lower leg with open wounds  Psychiatric:        Mood and Affect: Mood  normal.        Behavior: Behavior normal.        Thought Content: Thought content normal.        Judgment: Judgment normal.     Labs reviewed: Basic Metabolic Panel: Recent Labs    07/30/21 1341 12/11/21 1448  NA  140 138  K 4.5 4.6  CL 101 101  CO2 31 29  GLUCOSE 151* 294*  BUN 24 17  CREATININE 0.94 0.93  CALCIUM 9.8 9.6   Liver Function Tests: No results for input(s): "AST", "ALT", "ALKPHOS", "BILITOT", "PROT", "ALBUMIN" in the last 8760 hours. No results for input(s): "LIPASE", "AMYLASE" in the last 8760 hours. No results for input(s): "AMMONIA" in the last 8760 hours. CBC: No results for input(s): "WBC", "NEUTROABS", "HGB", "HCT", "MCV", "PLT" in the last 8760 hours. Lipid Panel: No results for input(s): "CHOL", "HDL", "LDLCALC", "TRIG", "CHOLHDL", "LDLDIRECT" in the last 8760 hours. Lab Results  Component Value Date   HGBA1C 7.4 (H) 04/16/2022    Procedures since last visit: No results found.  Assessment/Plan  1. Status post fall -  sustained small abrasion on mid upper back - CBC With Differential/Platelet - Basic Metabolic Panel with eGFR - POC Urinalysis Dipstick was negative for UTI  2. Lymphedema of both lower extremities -  continue Lasix  3. Type 2 diabetes mellitus with diabetic neuropathic arthropathy, without long-term current use of insulin (HCC) Lab Results  Component Value Date   HGBA1C 7.4 (H) 04/16/2022   - CBC With Differential/Platelet - Basic Metabolic Panel with eGFR -  continue Jardiance and Metformin  4. Primary insomnia - ALPRAZolam (XANAX) 0.25 MG tablet; Take 1 tablet (0.25 mg total) by mouth at bedtime as needed for anxiety.  Dispense: 20 tablet; Refill: 0  5. Class 1 obesity with body mass index (BMI) of 30.0 to 30.9 in adult, unspecified obesity type, unspecified whether serious comorbidity present -  counseled    Labs/tests ordered:  POC urine dipstick, BMP and CBC  Next appt:  Visit date not found

## 2022-05-26 NOTE — Patient Instructions (Signed)
Sleep at 10:00 PM daily. Avoid computer surfing at night to avoid the blue light which can cause him to have difficulty sleeping.  Insomnia Insomnia is a sleep disorder that makes it difficult to fall asleep or stay asleep. Insomnia can cause fatigue, low energy, difficulty concentrating, mood swings, and poor performance at work or school. There are three different ways to classify insomnia: Difficulty falling asleep. Difficulty staying asleep. Waking up too early in the morning. Any type of insomnia can be long-term (chronic) or short-term (acute). Both are common. Short-term insomnia usually lasts for 3 months or less. Chronic insomnia occurs at least three times a week for longer than 3 months. What are the causes? Insomnia may be caused by another condition, situation, or substance, such as: Having certain mental health conditions, such as anxiety and depression. Using caffeine, alcohol, tobacco, or drugs. Having gastrointestinal conditions, such as gastroesophageal reflux disease (GERD). Having certain medical conditions. These include: Asthma. Alzheimer's disease. Stroke. Chronic pain. An overactive thyroid gland (hyperthyroidism). Other sleep disorders, such as restless legs syndrome and sleep apnea. Menopause. Sometimes, the cause of insomnia may not be known. What increases the risk? Risk factors for insomnia include: Gender. Females are affected more often than males. Age. Insomnia is more common as people get older. Stress and certain medical and mental health conditions. Lack of exercise. Having an irregular work schedule. This may include working night shifts and traveling between different time zones. What are the signs or symptoms? If you have insomnia, the main symptom is having trouble falling asleep or having trouble staying asleep. This may lead to other symptoms, such as: Feeling tired or having low energy. Feeling nervous about going to sleep. Not feeling  rested in the morning. Having trouble concentrating. Feeling irritable, anxious, or depressed. How is this diagnosed? This condition may be diagnosed based on: Your symptoms and medical history. Your health care provider may ask about: Your sleep habits. Any medical conditions you have. Your mental health. A physical exam. How is this treated? Treatment for insomnia depends on the cause. Treatment may focus on treating an underlying condition that is causing the insomnia. Treatment may also include: Medicines to help you sleep. Counseling or therapy. Lifestyle adjustments to help you sleep better. Follow these instructions at home: Eating and drinking  Limit or avoid alcohol, caffeinated beverages, and products that contain nicotine and tobacco, especially close to bedtime. These can disrupt your sleep. Do not eat a large meal or eat spicy foods right before bedtime. This can lead to digestive discomfort that can make it hard for you to sleep. Sleep habits  Keep a sleep diary to help you and your health care provider figure out what could be causing your insomnia. Write down: When you sleep. When you wake up during the night. How well you sleep and how rested you feel the next day. Any side effects of medicines you are taking. What you eat and drink. Make your bedroom a dark, comfortable place where it is easy to fall asleep. Put up shades or blackout curtains to block light from outside. Use a white noise machine to block noise. Keep the temperature cool. Limit screen use before bedtime. This includes: Not watching TV. Not using your smartphone, tablet, or computer. Stick to a routine that includes going to bed and waking up at the same times every day and night. This can help you fall asleep faster. Consider making a quiet activity, such as reading, part of your nighttime routine. Try  to avoid taking naps during the day so that you sleep better at night. Get out of bed if you  are still awake after 15 minutes of trying to sleep. Keep the lights down, but try reading or doing a quiet activity. When you feel sleepy, go back to bed. General instructions Take over-the-counter and prescription medicines only as told by your health care provider. Exercise regularly as told by your health care provider. However, avoid exercising in the hours right before bedtime. Use relaxation techniques to manage stress. Ask your health care provider to suggest some techniques that may work well for you. These may include: Breathing exercises. Routines to release muscle tension. Visualizing peaceful scenes. Make sure that you drive carefully. Do not drive if you feel very sleepy. Keep all follow-up visits. This is important. Contact a health care provider if: You are tired throughout the day. You have trouble in your daily routine due to sleepiness. You continue to have sleep problems, or your sleep problems get worse. Get help right away if: You have thoughts about hurting yourself or someone else. Get help right away if you feel like you may hurt yourself or others, or have thoughts about taking your own life. Go to your nearest emergency room or: Call 911. Call the [facility] at 7600250942 or 988. This is open 24 hours a day. Text the Crisis Text Line at (416)255-9796. Summary Insomnia is a sleep disorder that makes it difficult to fall asleep or stay asleep. Insomnia can be long-term (chronic) or short-term (acute). Treatment for insomnia depends on the cause. Treatment may focus on treating an underlying condition that is causing the insomnia. Keep a sleep diary to help you and your health care provider figure out what could be causing your insomnia. This information is not intended to replace advice given to you by your health care provider. Make sure you discuss any questions you have with your health care provider. Document Revised: 02/18/2021 Document  Reviewed: 02/18/2021 Elsevier Patient Education  [place].

## 2022-05-27 LAB — BASIC METABOLIC PANEL WITH GFR
BUN: 15 mg/dL (ref 7–25)
CO2: 30 mmol/L (ref 20–32)
Calcium: 9.6 mg/dL (ref 8.6–10.3)
Chloride: 101 mmol/L (ref 98–110)
Creat: 0.86 mg/dL (ref 0.70–1.22)
Glucose, Bld: 115 mg/dL — ABNORMAL HIGH (ref 65–99)
Potassium: 4.7 mmol/L (ref 3.5–5.3)
Sodium: 141 mmol/L (ref 135–146)
eGFR: 83 mL/min/{1.73_m2} (ref >=60)

## 2022-05-27 LAB — CBC WITH DIFFERENTIAL/PLATELET
Absolute Monocytes: 735 cells/uL (ref 200–950)
Basophils Absolute: 39 cells/uL (ref 0–200)
Basophils Relative: 0.4 %
Eosinophils Absolute: 20 cells/uL (ref 15–500)
Eosinophils Relative: 0.2 %
HCT: 48.5 % (ref 38.5–50.0)
Hemoglobin: 16.1 g/dL (ref 13.2–17.1)
Lymphs Abs: 1254 cells/uL (ref 850–3900)
MCH: 32.2 pg (ref 27.0–33.0)
MCHC: 33.2 g/dL (ref 32.0–36.0)
MCV: 97 fL (ref 80.0–100.0)
MPV: 10.3 fL (ref 7.5–12.5)
Monocytes Relative: 7.5 %
Neutro Abs: 7752 cells/uL (ref 1500–7800)
Neutrophils Relative %: 79.1 %
Platelets: 236 10*3/uL (ref 140–400)
RBC: 5 10*6/uL (ref 4.20–5.80)
RDW: 11.7 % (ref 11.0–15.0)
Total Lymphocyte: 12.8 %
WBC: 9.8 10*3/uL (ref 3.8–10.8)

## 2022-05-27 NOTE — Progress Notes (Signed)
CBC, BMP are normal -  urine dipstick negative for UTII

## 2022-06-18 DIAGNOSIS — D225 Melanocytic nevi of trunk: Secondary | ICD-10-CM | POA: Diagnosis not present

## 2022-06-18 DIAGNOSIS — Z85820 Personal history of malignant melanoma of skin: Secondary | ICD-10-CM | POA: Diagnosis not present

## 2022-06-18 DIAGNOSIS — Z08 Encounter for follow-up examination after completed treatment for malignant neoplasm: Secondary | ICD-10-CM | POA: Diagnosis not present

## 2022-06-18 DIAGNOSIS — L814 Other melanin hyperpigmentation: Secondary | ICD-10-CM | POA: Diagnosis not present

## 2022-06-18 DIAGNOSIS — L821 Other seborrheic keratosis: Secondary | ICD-10-CM | POA: Diagnosis not present

## 2022-06-18 DIAGNOSIS — Z86006 Personal history of melanoma in-situ: Secondary | ICD-10-CM | POA: Diagnosis not present

## 2022-06-18 DIAGNOSIS — L570 Actinic keratosis: Secondary | ICD-10-CM | POA: Diagnosis not present

## 2022-06-18 DIAGNOSIS — Z85828 Personal history of other malignant neoplasm of skin: Secondary | ICD-10-CM | POA: Diagnosis not present

## 2022-09-24 DIAGNOSIS — H35373 Puckering of macula, bilateral: Secondary | ICD-10-CM | POA: Diagnosis not present

## 2022-09-24 DIAGNOSIS — H43813 Vitreous degeneration, bilateral: Secondary | ICD-10-CM | POA: Diagnosis not present

## 2022-09-24 DIAGNOSIS — E119 Type 2 diabetes mellitus without complications: Secondary | ICD-10-CM | POA: Diagnosis not present

## 2022-09-24 DIAGNOSIS — H524 Presbyopia: Secondary | ICD-10-CM | POA: Diagnosis not present

## 2022-09-24 DIAGNOSIS — H02403 Unspecified ptosis of bilateral eyelids: Secondary | ICD-10-CM | POA: Diagnosis not present

## 2022-09-24 DIAGNOSIS — H26493 Other secondary cataract, bilateral: Secondary | ICD-10-CM | POA: Diagnosis not present

## 2022-09-24 LAB — HM DIABETES EYE EXAM

## 2022-11-13 ENCOUNTER — Other Ambulatory Visit: Payer: Self-pay | Admitting: Family Medicine

## 2022-11-20 DIAGNOSIS — N401 Enlarged prostate with lower urinary tract symptoms: Secondary | ICD-10-CM | POA: Diagnosis not present

## 2022-11-20 DIAGNOSIS — N138 Other obstructive and reflux uropathy: Secondary | ICD-10-CM | POA: Diagnosis not present

## 2022-12-09 ENCOUNTER — Encounter: Payer: Medicare (Managed Care) | Admitting: Family

## 2022-12-09 ENCOUNTER — Ambulatory Visit (INDEPENDENT_AMBULATORY_CARE_PROVIDER_SITE_OTHER): Payer: Medicare (Managed Care) | Admitting: Sports Medicine

## 2022-12-09 ENCOUNTER — Encounter: Payer: Self-pay | Admitting: Sports Medicine

## 2022-12-09 VITALS — BP 116/78 | HR 78 | Temp 97.7°F | Resp 18 | Ht 72.0 in | Wt 230.2 lb

## 2022-12-09 DIAGNOSIS — I890 Lymphedema, not elsewhere classified: Secondary | ICD-10-CM

## 2022-12-09 DIAGNOSIS — N401 Enlarged prostate with lower urinary tract symptoms: Secondary | ICD-10-CM | POA: Diagnosis not present

## 2022-12-09 DIAGNOSIS — I10 Essential (primary) hypertension: Secondary | ICD-10-CM | POA: Diagnosis not present

## 2022-12-09 DIAGNOSIS — E11610 Type 2 diabetes mellitus with diabetic neuropathic arthropathy: Secondary | ICD-10-CM

## 2022-12-09 DIAGNOSIS — Z23 Encounter for immunization: Secondary | ICD-10-CM

## 2022-12-09 DIAGNOSIS — R2689 Other abnormalities of gait and mobility: Secondary | ICD-10-CM

## 2022-12-09 DIAGNOSIS — N138 Other obstructive and reflux uropathy: Secondary | ICD-10-CM | POA: Diagnosis not present

## 2022-12-09 MED ORDER — LORATADINE 10 MG PO TABS: 10.0000 mg | ORAL_TABLET | Freq: Every day | ORAL | 11 refills | Status: AC

## 2022-12-09 NOTE — Progress Notes (Signed)
Careteam: Patient Care Team: Venita Sheffield, MD as PCP - General (Internal Medicine) Esaw Dace, MD as Attending Physician (Urology) Loletha Carrow, MD (Ophthalmology) Naida Sleight, MD as Referring Physician  PLACE OF SERVICE:  [facility]  Advanced Directive information Does Patient Have a Medical Advance Directive?: Yes, Type of Advance Directive: Healthcare Power of Attorney, Does patient want to make changes to medical advance directive?: No - Patient declined  Allergies  Allergen Reactions   Lopressor [Metoprolol Tartrate]     Mental status changes (intolerance)    Beta Adrenergic Blockers     "they make me depressed"    No chief complaint on file.    HPI: Patient is a 89 y.o. male is here for a follow up  Accompanied by his daughter in law Lives alone, has caregivers for 6 hrs / day  Pt has no concerns today   DM  Lab Results  Component Value Date   HGBA1C 7.4 (H) 04/16/2022   HGBA1C 7.5 (H) 12/11/2021   HGBA1C 7.2 (H) 07/30/2021   Checks blood sugars every other day  On jardiance, metformin  Denies hypoglycemic episodes  HTN  Lower extremity swelling  Chronic  Uses compression stockings  No recent change Has redness  Denies fevers On lasix   Nasal congestion  Post nasal drip  Does not take antihistamine   Balance problems Ambulates with a walker No recent falls     Review of Systems:  Review of Systems  Constitutional:  Negative for chills and fever.  HENT:  Negative for congestion and sore throat.   Eyes:  Negative for double vision.  Respiratory:  Negative for cough, sputum production and shortness of breath.   Cardiovascular:  Positive for leg swelling. Negative for chest pain and palpitations.  Gastrointestinal:  Negative for abdominal pain, heartburn and nausea.  Genitourinary:  Negative for dysuria, frequency and hematuria.  Musculoskeletal:  Negative for falls and myalgias.  Neurological:  Negative for  dizziness, sensory change and focal weakness.     Past Medical History:  Diagnosis Date   Benign prostatic hyperplasia with urinary obstruction    Bladder neck obstruction 02/02/2012   Colon polyps 09/01/2011   Depression    Diabetes mellitus without complication (HCC) 09/01/2011   Dyslipidemia associated with type 2 diabetes mellitus (HCC)    Edema    edema in lower legs   Hyperlipemia    Hypertension 09/01/2011   Kyphosis deformity of spine    Lung disease    Melanoma (HCC)    on back   Past Surgical History:  Procedure Laterality Date   HERNIA REPAIR     1960's   MEDIAL PARTIAL KNEE REPLACEMENT  2015   melanoma removed from back  2008   sub mucus  2015   Social History:   reports that he quit smoking about 57 years ago. His smoking use included cigarettes. He started smoking about 67 years ago. He has never used smokeless tobacco. He reports that he does not currently use alcohol. He reports that he does not use drugs.  Family History  Problem Relation Age of Onset   Pancreatic cancer Father    Stomach cancer Sister    Hypertension Maternal Grandmother    Stroke Maternal Grandfather    Heart disease Paternal Grandmother     Medications: Patient's Medications  New Prescriptions   No medications on file  Previous Medications   ACCU-CHEK GUIDE TEST STRIP       ALBUTEROL (VENTOLIN HFA)  108 (90 BASE) MCG/ACT INHALER    Inhale 1 puff into the lungs as needed for wheezing or shortness of breath.   ALPRAZOLAM (XANAX) 0.25 MG TABLET    Take 1 tablet (0.25 mg total) by mouth at bedtime as needed for anxiety.   ASPIRIN 81 MG TABLET    Take 81 mg by mouth daily.   FINASTERIDE (PROSCAR) 5 MG TABLET    Take 5 mg by mouth daily.   FLUTICASONE (FLONASE) 50 MCG/ACT NASAL SPRAY    Place 1 spray into both nostrils daily.   FUROSEMIDE (LASIX) 40 MG TABLET    TAKE 1 TABLET BY MOUTH EVERY DAY   JARDIANCE 10 MG TABS TABLET    TAKE 1 TABLET BY MOUTH EVERY DAY   MAGNESIUM HYDROXIDE  (MILK OF MAGNESIA) 400 MG/5ML SUSPENSION    1/2 capful by mouth every night   METFORMIN (GLUCOPHAGE) 500 MG TABLET    TAKE 1 TABLET BY MOUTH TWICE A DAY WITH A MEAL   POTASSIUM CHLORIDE SA (KLOR-CON M20) 20 MEQ TABLET    Take 1 tablet (20 mEq total) by mouth daily.   SILVER SULFADIAZINE (SILVADENE) 1 % CREAM    Apply 1 Application topically as needed. To legs for lesions   TRIAMCINOLONE CREAM (KENALOG) 0.1 %    APPLY TO AFFECTED AREA TWICE A DAY  Modified Medications   No medications on file  Discontinued Medications   No medications on file    Physical Exam:  Vitals:   12/09/22 1245  BP: 116/78  Pulse: 78  Resp: 18  Temp: 97.7 F (36.5 C)  SpO2: 92%  Weight: 230 lb 3.2 oz (104.4 kg)  Height: 6' (1.829 m)   Body mass index is 31.22 kg/m. Wt Readings from Last 3 Encounters:  12/09/22 230 lb 3.2 oz (104.4 kg)  05/26/22 227 lb (103 kg)  05/14/22 227 lb (103 kg)    Physical Exam Constitutional:      Appearance: Normal appearance.  HENT:     Head: Normocephalic and atraumatic.  Cardiovascular:     Rate and Rhythm: Normal rate and regular rhythm.     Pulses: Normal pulses.     Heart sounds: Normal heart sounds.  Pulmonary:     Effort: No respiratory distress.     Breath sounds: No stridor. No wheezing or rales.  Abdominal:     General: Bowel sounds are normal. There is no distension.     Palpations: Abdomen is soft.     Tenderness: There is no abdominal tenderness. There is no right CVA tenderness or guarding.  Musculoskeletal:        General: No swelling.  Neurological:     Mental Status: He is alert. Mental status is at baseline.     Sensory: No sensory deficit.     Motor: No weakness.     Labs reviewed: Basic Metabolic Panel: Recent Labs    12/11/21 1448 05/26/22 1438  NA 138 141  K 4.6 4.7  CL 101 101  CO2 29 30  GLUCOSE 294* 115*  BUN 17 15  CREATININE 0.93 0.86  CALCIUM 9.6 9.6   Liver Function Tests: No results for input(s): "AST", "ALT",  "ALKPHOS", "BILITOT", "PROT", "ALBUMIN" in the last 8760 hours. No results for input(s): "LIPASE", "AMYLASE" in the last 8760 hours. No results for input(s): "AMMONIA" in the last 8760 hours. CBC: Recent Labs    05/26/22 1438  WBC 9.8  NEUTROABS 7,752  HGB 16.1  HCT 48.5  MCV 97.0  PLT 236   Lipid Panel: No results for input(s): "CHOL", "HDL", "LDLCALC", "TRIG", "CHOLHDL", "LDLDIRECT" in the last 8760 hours. TSH: No results for input(s): "TSH" in the last 8760 hours. A1C: Lab Results  Component Value Date   HGBA1C 7.4 (H) 04/16/2022     Assessment/Plan   1. Balance problem  Instructed patient to use walker all the time - Ambulatory referral to Physical Therapy - Vitamin B12  2. Type 2 diabetes mellitus with diabetic neuropathic arthropathy, without long-term current use of insulin (HCC)  Cont with metformin, jardiance Avoid high carbohydrate foods Exercise regularly - Hemoglobin A1C - Basic Metabolic Panel with eGFR - Vitamin B12 Will check urine microalbumin next visit   3. Essential hypertension At goal   4. Benign prostatic hyperplasia with urinary obstruction  Stable , cont with proscar  5. Lymphedema of both lower extremities  Elevate feet  Use compression stockings  6. Need for shingles vaccine  Instructed patient to get shingles shot at local pharmacy  7. Need for COVID-19 vaccine  Instructed to go to pharmacy for covid shot  Other orders - loratadine (CLARITIN) 10 MG tablet; Take 1 tablet (10 mg total) by mouth daily.  Dispense: 30 tablet; Refill: 11  No follow-ups on file.:  3 months

## 2022-12-10 ENCOUNTER — Telehealth: Payer: Self-pay

## 2022-12-10 ENCOUNTER — Other Ambulatory Visit: Payer: Self-pay | Admitting: Sports Medicine

## 2022-12-10 MED ORDER — LISINOPRIL 5 MG PO TABS: 5.0000 mg | ORAL_TABLET | Freq: Every day | ORAL | 3 refills | Status: DC

## 2022-12-10 NOTE — Telephone Encounter (Signed)
-----   Message from [facility] sent at 12/10/2022  9:42 AM EDT ----- Plz call the patient and inform that his diabetes got better, urine test showed some protein in his urine, will start lisinopril  to prevent damage to his kidneys.

## 2022-12-16 ENCOUNTER — Ambulatory Visit (INDEPENDENT_AMBULATORY_CARE_PROVIDER_SITE_OTHER): Payer: Medicare (Managed Care) | Admitting: Family

## 2022-12-16 ENCOUNTER — Encounter: Payer: Self-pay | Admitting: Family

## 2022-12-16 VITALS — BP 130/78 | HR 83 | Temp 97.9°F | Resp 16 | Ht 72.0 in | Wt 229.0 lb

## 2022-12-16 DIAGNOSIS — Z0000 Encounter for general adult medical examination without abnormal findings: Secondary | ICD-10-CM

## 2022-12-16 NOTE — Patient Instructions (Signed)
Brian [name] , Thank you for taking time to come for your Medicare Wellness Visit. I appreciate your ongoing commitment to your health goals. Please review the following plan we discussed and let me know if I can assist you in the future.   Screening recommendations/referrals: Colonoscopy N/A Recommended yearly ophthalmology/optometry visit for glaucoma screening and checkup Recommended yearly dental visit for hygiene and checkup  Vaccinations: Influenza vaccine due annually in September/October Pneumococcal vaccine : Up to date  Tdap vaccine  : Up to date  Shingles vaccine     Advanced directives: Yes   Conditions/risks identified:  advanced age (>51men, >37 women);diabetes mellitus;male gender;obesity (BMI >30kg/m2);hypertension;smoking/ tobacco exposure;sedentary lifestyle  Next appointment: 1 year   Preventive Care 20 Years and Older, Male Preventive care refers to lifestyle choices and visits with your health care provider that can promote health and wellness. What does preventive care include? A yearly physical exam. This is also called an annual well check. Dental exams once or twice a year. Routine eye exams. Ask your health care provider how often you should have your eyes checked. Personal lifestyle choices, including: Daily care of your teeth and gums. Regular physical activity. Eating a healthy diet. Avoiding tobacco and drug use. Limiting alcohol use. Practicing safe sex. Taking low doses of aspirin every day. Taking vitamin and mineral supplements as recommended by your health care provider. What happens during an annual well check? The services and screenings done by your health care provider during your annual well check will depend on your age, overall health, lifestyle risk factors, and family history of disease. Counseling  Your health care provider may ask you questions about your: Alcohol use. Tobacco use. Drug use. Emotional well-being. Home and  relationship well-being. Sexual activity. Eating habits. History of falls. Memory and ability to understand (cognition). Work and work Astronomer. Screening  You may have the following tests or measurements: Height, weight, and BMI. Blood pressure. Lipid and cholesterol levels. These may be checked every 5 years, or more frequently if you are over 49 years old. Skin check. Lung cancer screening. You may have this screening every year starting at age 62 if you have a 30-pack-year history of smoking and currently smoke or have quit within the past 15 years. Fecal occult blood test (FOBT) of the stool. You may have this test every year starting at age 42. Flexible sigmoidoscopy or colonoscopy. You may have a sigmoidoscopy every 5 years or a colonoscopy every 10 years starting at age 52. Prostate cancer screening. Recommendations will vary depending on your family history and other risks. Hepatitis C blood test. Hepatitis B blood test. Sexually transmitted disease (STD) testing. Diabetes screening. This is done by checking your blood sugar (glucose) after you have not eaten for a while (fasting). You may have this done every 1-3 years. Abdominal aortic aneurysm (AAA) screening. You may need this if you are a current or former smoker. Osteoporosis. You may be screened starting at age 74 if you are at high risk. Talk with your health care provider about your test results, treatment options, and if necessary, the need for more tests. Vaccines  Your health care provider may recommend certain vaccines, such as: Influenza vaccine. This is recommended every year. Tetanus, diphtheria, and acellular pertussis (Tdap, Td) vaccine. You may need a Td booster every 10 years. Zoster vaccine. You may need this after age 39. Pneumococcal 13-valent conjugate (PCV13) vaccine. One dose is recommended after age 63. Pneumococcal polysaccharide (PPSV23) vaccine. One dose is recommended  after age 66. Talk to your  health care provider about which screenings and vaccines you need and how often you need them. This information is not intended to replace advice given to you by your health care provider. Make sure you discuss any questions you have with your health care provider. Document Released: 04/06/2015 Document Revised: 11/28/2015 Document Reviewed: 01/09/2015 Elsevier Interactive Patient Education  2017 [employer].  Fall Prevention in the Home Falls can cause injuries. They can happen to people of all ages. There are many things you can do to make your home safe and to help prevent falls. What can I do on the outside of my home? Regularly fix the edges of walkways and driveways and fix any cracks. Remove anything that might make you trip as you walk through a door, such as a raised step or threshold. Trim any bushes or trees on the path to your home. Use bright outdoor lighting. Clear any walking paths of anything that might make someone trip, such as rocks or tools. Regularly check to see if handrails are loose or broken. Make sure that both sides of any steps have handrails. Any raised decks and porches should have guardrails on the edges. Have any leaves, snow, or ice cleared regularly. Use sand or salt on walking paths during winter. Clean up any spills in your garage right away. This includes oil or grease spills. What can I do in the bathroom? Use night lights. Install grab bars by the toilet and in the tub and shower. Do not use towel bars as grab bars. Use non-skid mats or decals in the tub or shower. If you need to sit down in the shower, use a plastic, non-slip stool. Keep the floor dry. Clean up any water that spills on the floor as soon as it happens. Remove soap buildup in the tub or shower regularly. Attach bath mats securely with double-sided non-slip rug tape. Do not have throw rugs and other things on the floor that can make you trip. What can I do in the bedroom? Use night  lights. Make sure that you have a light by your bed that is easy to reach. Do not use any sheets or blankets that are too big for your bed. They should not hang down onto the floor. Have a firm chair that has side arms. You can use this for support while you get dressed. Do not have throw rugs and other things on the floor that can make you trip. What can I do in the kitchen? Clean up any spills right away. Avoid walking on wet floors. Keep items that you use a lot in easy-to-reach places. If you need to reach something above you, use a strong step stool that has a grab bar. Keep electrical cords out of the way. Do not use floor polish or wax that makes floors slippery. If you must use wax, use non-skid floor wax. Do not have throw rugs and other things on the floor that can make you trip. What can I do with my stairs? Do not leave any items on the stairs. Make sure that there are handrails on both sides of the stairs and use them. Fix handrails that are broken or loose. Make sure that handrails are as long as the stairways. Check any carpeting to make sure that it is firmly attached to the stairs. Fix any carpet that is loose or worn. Avoid having throw rugs at the top or bottom of the stairs. If you  do have throw rugs, attach them to the floor with carpet tape. Make sure that you have a light switch at the top of the stairs and the bottom of the stairs. If you do not have them, ask someone to add them for you. What else can I do to help prevent falls? Wear shoes that: Do not have high heels. Have rubber bottoms. Are comfortable and fit you well. Are closed at the toe. Do not wear sandals. If you use a stepladder: Make sure that it is fully opened. Do not climb a closed stepladder. Make sure that both sides of the stepladder are locked into place. Ask someone to hold it for you, if possible. Clearly mark and make sure that you can see: Any grab bars or handrails. First and last  steps. Where the edge of each step is. Use tools that help you move around (mobility aids) if they are needed. These include: Canes. Walkers. Scooters. Crutches. Turn on the lights when you go into a dark area. Replace any light bulbs as soon as they burn out. Set up your furniture so you have a clear path. Avoid moving your furniture around. If any of your floors are uneven, fix them. If there are any pets around you, be aware of where they are. Review your medicines with your doctor. Some medicines can make you feel dizzy. This can increase your chance of falling. Ask your doctor what other things that you can do to help prevent falls. This information is not intended to replace advice given to you by your health care provider. Make sure you discuss any questions you have with your health care provider. Document Released: 01/04/2009 Document Revised: 08/16/2015 Document Reviewed: 04/14/2014 Elsevier Interactive Patient Education  2017 [employer].

## 2022-12-16 NOTE — Progress Notes (Signed)
Subjective:   Brian [name] is a 89 y.o. male who presents for Medicare Annual/Subsequent preventive examination.  Visit Complete: In person  Patient Medicare AWV questionnaire was completed by the patient on 12/16/2022; I have confirmed that all information answered by patient is correct and no changes since this date.  Cardiac Risk Factors include: advanced age (>96men, >29 women);diabetes mellitus;male gender;obesity (BMI >30kg/m2);hypertension;smoking/ tobacco exposure;sedentary lifestyle     Objective:    Today's Vitals   12/16/22 1047 12/16/22 1100  BP: 130/78   Pulse: 83   Resp: 16   Temp: 97.9 F (36.6 C)   SpO2: 91%   Weight: 229 lb (103.9 kg)   Height: 6' (1.829 m)   PainSc:  5    Body mass index is 31.06 kg/m.     12/16/2022   10:46 AM 12/09/2022   12:51 PM 05/14/2022   12:39 PM 07/30/2021   11:19 AM 12/04/2020    3:52 PM 07/17/2020    1:38 PM 05/25/2020   11:44 AM  Advanced Directives  Does Patient Have a Medical Advance Directive? Yes Yes Yes Yes Yes Yes Yes  Type of Estate agent of [place];Living will Healthcare Power of [employer] of [place];Living will Healthcare Power of [employer] Power of [employer] Power of Attorney Out of facility DNR (pink MOST or yellow form);Healthcare Power of Attorney  Does patient want to make changes to medical advance directive? Yes (Inpatient - patient defers changing a medical advance directive at this time - Information given) No - Patient declined No - Patient declined Yes (MAU/Ambulatory/Procedural Areas - Information given) No - Patient declined No - Patient declined No - Patient declined  Copy of Healthcare Power of Attorney in Chart? Yes - validated most recent copy scanned in chart (See row information) Yes - validated most recent copy scanned in chart (See row information) Yes - validated most recent copy scanned in chart (See row information) No - copy requested Yes -  validated most recent copy scanned in chart (See row information) No - copy requested Yes - validated most recent copy scanned in chart (See row information)    Current Medications (verified) Outpatient Encounter Medications as of 12/16/2022  Medication Sig   ACCU-CHEK GUIDE test strip    albuterol (VENTOLIN HFA) 108 (90 Base) MCG/ACT inhaler Inhale 1 puff into the lungs as needed for wheezing or shortness of breath.   aspirin 81 MG tablet Take 81 mg by mouth daily.   finasteride (PROSCAR) 5 MG tablet Take 5 mg by mouth daily.   fluticasone (FLONASE) 50 MCG/ACT nasal spray Place 1 spray into both nostrils daily.   furosemide (LASIX) 40 MG tablet TAKE 1 TABLET BY MOUTH EVERY DAY   JARDIANCE 10 MG TABS tablet TAKE 1 TABLET BY MOUTH EVERY DAY   lisinopril (ZESTRIL) 5 MG tablet Take 1 tablet (5 mg total) by mouth daily.   loratadine (CLARITIN) 10 MG tablet Take 1 tablet (10 mg total) by mouth daily.   magnesium hydroxide (MILK OF MAGNESIA) 400 MG/5ML suspension 1/2 capful by mouth every night   metFORMIN (GLUCOPHAGE) 500 MG tablet TAKE 1 TABLET BY MOUTH TWICE A DAY WITH A MEAL   potassium chloride SA (KLOR-CON M20) 20 MEQ tablet Take 1 tablet (20 mEq total) by mouth daily.   silver sulfADIAZINE (SILVADENE) 1 % cream Apply 1 Application topically as needed. To legs for lesions   triamcinolone cream (KENALOG) 0.1 % APPLY TO AFFECTED AREA TWICE A DAY  Facility-Administered Encounter Medications as of 12/16/2022  Medication   mupirocin ointment (BACTROBAN) 2 %    Allergies (verified) Lopressor [metoprolol tartrate] and Beta adrenergic blockers   History: Past Medical History:  Diagnosis Date   Benign prostatic hyperplasia with urinary obstruction    Bladder neck obstruction 02/02/2012   Colon polyps 09/01/2011   Depression    Diabetes mellitus without complication (HCC) 09/01/2011   Dyslipidemia associated with type 2 diabetes mellitus (HCC)    Edema    edema in lower legs    Hyperlipemia    Hypertension 09/01/2011   Kyphosis deformity of spine    Lung disease    Melanoma (HCC)    on back   Past Surgical History:  Procedure Laterality Date   HERNIA REPAIR     1960's   MEDIAL PARTIAL KNEE REPLACEMENT  2015   melanoma removed from back  2008   sub mucus  2015   Family History  Problem Relation Age of Onset   Pancreatic cancer Father    Stomach cancer Sister    Hypertension Maternal Grandmother    Stroke Maternal Grandfather    Heart disease Paternal Grandmother    Social History   Socioeconomic History   Marital status: Widowed    Spouse name: Johnny Bridge    Number of children: 1   Years of education: 7   Highest education level: Doctorate  Occupational History   Occupation: Retired Location manager   Tobacco Use   Smoking status: Former    Current packs/day: 0.00    Types: Cigarettes    Start date: 1957    Quit date: 1967    Years since quitting: 57.7   Smokeless tobacco: Never  Vaping Use   Vaping status: Never Used  Substance and Sexual Activity   Alcohol use: Not Currently   Drug use: Never   Sexual activity: Not Currently  Other Topics Concern   Not on file  Social History Narrative   Social History      Diet? Low sodium       Do you drink/eat things with caffeine? No       Marital status?                            Widowed         What year were you married? 1958      Do you live in a house, apartment, assisted living, condo, trailer, etc.? house      Is it one or more stories? One       How many persons live in your home? One       Do you have any pets in your home? (please list) no       Highest level of education completed? Graduate degree divinity [employer]       Current or past profession: [employer]       Do you exercise?               No                        Type & how often?      Advanced Directives      Do you have a living will? yes      Do you have a DNR form?  If not, do you want to discuss one? No       Do you have signed POA/HPOA for forms? No       Functional Status      Do you have difficulty bathing or dressing yourself? Yes       Do you have difficulty preparing food or eating? Yes       Do you have difficulty managing your medications? No       Do you have difficulty managing your finances? No       Do you have difficulty affording your medications?no       Social Determinants of Health   Financial Resource Strain: Not on file  Food Insecurity: Not on file  Transportation Needs: Not on file  Physical Activity: Not on file  Stress: Not on file  Social Connections: Not on file    Tobacco Counseling Counseling given: Not Answered   Clinical Intake:  Pre-visit preparation completed: No  Pain : 0-10 Pain Score: 5  (3 on the knee) Pain Type: Chronic pain Pain Location: Hip (right knee) Pain Orientation: Right Pain Radiating Towards: no Pain Descriptors / Indicators: Aching Pain Onset: More than a month ago Pain Frequency: Intermittent Pain Relieving Factors: tylenol and walking Effect of Pain on Daily Activities: sitting  Pain Relieving Factors: tylenol and walking  BMI - recorded: 31.06 Nutritional Status: BMI > 30  Obese Nutritional Risks: None Diabetes: Yes CBG done?: No CBG resulted in Enter/ Edit results?: Yes (167 this morning) Did pt. bring in CBG monitor from home?: No  How often do you need to have someone help you when you read instructions, pamphlets, or other written materials from your doctor or pharmacy?: 1 - Never What is the last grade level you completed in school?: Masters  Interpreter Needed?: No      Activities of Daily Living    12/16/2022   11:15 AM  In your present state of health, do you have any difficulty performing the following activities:  Hearing? 1  Vision? 0  Difficulty concentrating or making decisions? 1  Comment Remembering  Walking or climbing stairs? 1  Comment  uses a walker  Dressing or bathing? 0  Doing errands, shopping? 1  Comment Family [employer] broker and eating ? N  Using the Toilet? N  In the past six months, have you accidently leaked urine? N  Do you have problems with loss of bowel control? N  Managing your Medications? N  Managing your Finances? N  Housekeeping or managing your Housekeeping? Y  Comment care giver assist    Patient Care Team: Venita Sheffield, MD as PCP - General (Internal Medicine) Esaw Dace, MD as Attending Physician (Urology) Loletha Carrow, MD (Ophthalmology) Naida Sleight, MD as Referring Physician  Indicate any recent Medical Services you may have received from other than [facility] providers in the past year (date may be approximate).     Assessment:   This is a routine wellness examination for Brian [name].  Hearing/Vision screen No results found.   Goals Addressed   None    Depression Screen    12/16/2022   10:46 AM 12/09/2022   12:50 PM 12/06/2021   10:30 AM 07/30/2021   12:50 PM 12/04/2020    3:49 PM 05/20/2019   11:15 AM  PHQ 2/9 Scores  PHQ - 2 Score 0 0 1 0 0 0  PHQ- 9 Score  0        Fall Risk  12/16/2022   10:46 AM 12/09/2022   12:50 PM 04/16/2022   11:05 AM 12/11/2021    1:59 PM 12/06/2021   10:30 AM  Fall Risk   Falls in the past year? 0 0 0 0 0  Number falls in past yr:  0 0 0 0  Injury with Fall?  0 0 0 0  Risk for fall due to :  No Fall Risks No Fall Risks No Fall Risks No Fall Risks  Follow up  Falls evaluation completed Falls evaluation completed  Falls evaluation completed    MEDICARE RISK AT HOME: Medicare Risk at Home Any stairs in or around the home?: No If so, are there any without handrails?: No Home free of loose throw rugs in walkways, pet beds, electrical cords, etc?: No Adequate lighting in your home to reduce risk of falls?: Yes Life alert?: Yes Use of a cane, walker or w/c?: Yes Grab bars in the bathroom?: Yes Shower chair or bench in  shower?: Yes Elevated toilet seat or a handicapped toilet?: Yes  TIMED UP AND GO:  Was the test performed?  No    Cognitive Function:    12/16/2022   10:49 AM  MMSE - Mini Mental State Exam  Orientation to time 5  Orientation to Place 5  Registration 3  Attention/ Calculation 5  Recall 2  Language- name 2 objects 2  Language- repeat 1  Language- follow 3 step command 3  Language- read & follow direction 1  Write a sentence 1  Copy design 1  Total score 29        12/06/2021   10:32 AM 12/04/2020    3:50 PM  6CIT Screen  What Year? 0 points 0 points  What month? 0 points 0 points  What time? 0 points 0 points  Count back from 20 0 points 0 points  Months in reverse 0 points 0 points  Repeat phrase 0 points 0 points  Total Score 0 points 0 points    Immunizations Immunization History  Administered Date(s) Administered   Fluad Quad(high Dose 65+) 12/22/2019, 04/16/2022   Fluad Trivalent(High Dose 65+) 12/09/2022   Fluzone Influenza virus vaccine,trivalent (IIV3), split virus 12/14/2009   H1N1 03/27/2008, 03/27/2008   Influenza, High Dose Seasonal PF 12/18/2014, 11/26/2016, 12/30/2017, 12/30/2017, 12/27/2018   Influenza, Quadrivalent, Recombinant, Inj, Pf 12/29/2012   Influenza,inj,quad, With Preservative 12/30/2017   Influenza,trivalent, recombinat, inj, PF 12/14/2009   Influenza-Unspecified 12/14/2009, 12/30/2017, 12/30/2017   Moderna Sars-Covid-2 Vaccination 05/07/2019, 06/04/2019, 01/30/2020   Pneumococcal Conjugate-13 04/20/2013   Pneumococcal Polysaccharide-23 10/27/2006   Tdap 02/08/2015, 02/08/2015, 02/17/2019    TDAP status: Up to date  Flu Vaccine status: Up to date  Pneumococcal vaccine status: Up to date  Covid-19 vaccine status: Information provided on how to obtain vaccines.   Qualifies for Shingles Vaccine? Yes   Zostavax completed No   Shingrix Completed?: No.    Education has been provided regarding the importance of this vaccine. Patient  has been advised to call insurance company to determine out of pocket expense if they have not yet received this vaccine. Advised may also receive vaccine at local pharmacy or Health Dept. Verbalized acceptance and understanding.  Screening Tests Health Maintenance  Topic Date Due   Zoster Vaccines- Shingrix (1 of 2) Never done   COVID-19 Vaccine (4 - 2023-24 season) 11/23/2022   HEMOGLOBIN A1C  06/08/2023   OPHTHALMOLOGY EXAM  09/24/2023   FOOT EXAM  12/09/2023   Medicare Annual Wellness (AWV)  12/16/2023  DTaP/Tdap/Td (4 - Td or Tdap) 02/16/2029   Pneumonia Vaccine 56+ Years old  Completed   INFLUENZA VACCINE  Completed   HPV VACCINES  Aged Out    Health Maintenance  Health Maintenance Due  Topic Date Due   Zoster Vaccines- Shingrix (1 of 2) Never done   COVID-19 Vaccine (4 - 2023-24 season) 11/23/2022    Colorectal cancer screening: No longer required.   Lung Cancer Screening: (Low Dose CT Chest recommended if Age 65-80 years, 20 pack-year currently smoking OR have quit w/in 15years.) does not qualify.   Lung Cancer Screening Referral: No   Additional Screening:  Hepatitis C Screening: does not qualify; Completed N/A   Vision Screening: Recommended annual ophthalmology exams for early detection of glaucoma and other disorders of the eye. Is the patient up to date with their annual eye exam?  Yes  Who is the provider or what is the name of the office in which the patient attends annual eye exams? Dr.Tanner  If pt is not established with a provider, would they like to be referred to a provider to establish care? No .   Dental Screening: Recommended annual dental exams for proper oral hygiene  Diabetic Foot Exam: Diabetic Foot Exam: Completed Follows up with Podiatrist.  Community Resource Referral / Chronic Care Management: CRR required this visit?  No   CCM required this visit?  No     Plan:     I have personally reviewed and noted the following in the  patient's chart:   Medical and social history Use of alcohol, tobacco or illicit drugs  Current medications and supplements including opioid prescriptions. Patient is not currently taking opioid prescriptions. Functional ability and status Nutritional status Physical activity Advanced directives List of other physicians Hospitalizations, surgeries, and ER visits in previous 12 months Vitals Screenings to include cognitive, depression, and falls Referrals and appointments  In addition, I have reviewed and discussed with patient certain preventive protocols, quality metrics, and best practice recommendations. A written personalized care plan for preventive services as well as general preventive health recommendations were provided to patient.     Caesar Bookman, NP   12/16/2022   After Visit Summary: (In Person-Declined) Patient declined AVS at this time.  Nurse Notes: Advised to get COVID-19

## 2022-12-24 DIAGNOSIS — Z86006 Personal history of melanoma in-situ: Secondary | ICD-10-CM | POA: Diagnosis not present

## 2022-12-24 DIAGNOSIS — Z08 Encounter for follow-up examination after completed treatment for malignant neoplasm: Secondary | ICD-10-CM | POA: Diagnosis not present

## 2022-12-24 DIAGNOSIS — Z85828 Personal history of other malignant neoplasm of skin: Secondary | ICD-10-CM | POA: Diagnosis not present

## 2022-12-24 DIAGNOSIS — L570 Actinic keratosis: Secondary | ICD-10-CM | POA: Diagnosis not present

## 2022-12-24 DIAGNOSIS — Z85820 Personal history of malignant melanoma of skin: Secondary | ICD-10-CM | POA: Diagnosis not present

## 2022-12-24 DIAGNOSIS — B354 Tinea corporis: Secondary | ICD-10-CM | POA: Diagnosis not present

## 2022-12-24 DIAGNOSIS — D225 Melanocytic nevi of trunk: Secondary | ICD-10-CM | POA: Diagnosis not present

## 2022-12-24 DIAGNOSIS — L814 Other melanin hyperpigmentation: Secondary | ICD-10-CM | POA: Diagnosis not present

## 2022-12-24 DIAGNOSIS — L821 Other seborrheic keratosis: Secondary | ICD-10-CM | POA: Diagnosis not present

## 2023-02-03 ENCOUNTER — Ambulatory Visit (INDEPENDENT_AMBULATORY_CARE_PROVIDER_SITE_OTHER): Payer: Medicare (Managed Care) | Admitting: Sports Medicine

## 2023-02-03 ENCOUNTER — Encounter: Payer: Self-pay | Admitting: Sports Medicine

## 2023-02-03 VITALS — BP 118/70 | HR 94 | Temp 97.3°F | Resp 17 | Ht 72.0 in | Wt 227.8 lb

## 2023-02-03 DIAGNOSIS — I10 Essential (primary) hypertension: Secondary | ICD-10-CM

## 2023-02-03 DIAGNOSIS — L03115 Cellulitis of right lower limb: Secondary | ICD-10-CM | POA: Diagnosis not present

## 2023-02-03 DIAGNOSIS — M7989 Other specified soft tissue disorders: Secondary | ICD-10-CM

## 2023-02-03 DIAGNOSIS — M79604 Pain in right leg: Secondary | ICD-10-CM

## 2023-02-03 MED ORDER — AMOXICILLIN-POT CLAVULANATE 875-125 MG PO TABS: 1.0000 | ORAL_TABLET | Freq: Two times a day (BID) | ORAL | 0 refills | Status: DC

## 2023-02-03 NOTE — Patient Instructions (Addendum)
Augmentin 1 tab twice daily for 5 days Increase lasix to 40 mg twice daily  Apply bacitracin on the wound and apply xeroform on the wound and wrap it with kerlix  Avoid salty and fried foods  Limit salt intake  Try to eat fresh fruits  Limit sweets

## 2023-02-03 NOTE — Progress Notes (Unsigned)
Careteam: Patient Care Team: Venita Sheffield, MD as PCP - General (Internal Medicine) Esaw Dace, MD as Attending Physician (Urology) Loletha Carrow, MD (Ophthalmology) Naida Sleight, MD as Referring Physician  PLACE OF SERVICE:  [facility]  Advanced Directive information Does Patient Have a Medical Advance Directive?: Yes, Type of Advance Directive: Healthcare Power of [place];Living will, Does patient want to make changes to medical advance directive?: No - Patient declined  Allergies  Allergen Reactions   Lopressor [Metoprolol Tartrate]     Mental status changes (intolerance)    Beta Adrenergic Blockers     "they make me depressed"    Chief Complaint  Patient presents with   Acute Visit    Patient complains of blisters on legs open and weeping.      HPI: Patient is a 89 y.o. male presented to clinic for acute visit for blisters on lower extremity Worsening Rt lower extremity swelling since 10 days He is accompanied by his daughter in law States he hasn't change in hsi breathing Denies fevers, chills Reports good appetite  Daughter in law states that he is not eating well lately Pt denies chest pain, palpitations, SOB, abdomina pain, nausea, vomiting, orthopnea   Review of Systems:  Review of Systems  Constitutional:  Negative for chills and fever.  HENT:  Negative for congestion and sore throat.   Eyes:  Negative for double vision.  Respiratory:  Negative for cough, sputum production and shortness of breath.   Cardiovascular:  Positive for leg swelling. Negative for chest pain and palpitations.  Gastrointestinal:  Negative for abdominal pain, heartburn and nausea.  Genitourinary:  Negative for dysuria, frequency and hematuria.  Musculoskeletal:  Negative for falls and myalgias.  Neurological:  Negative for dizziness, sensory change and focal weakness.    Past Medical History:  Diagnosis Date   Benign prostatic hyperplasia with urinary  obstruction    Bladder neck obstruction 02/02/2012   Colon polyps 09/01/2011   Depression    Diabetes mellitus without complication (HCC) 09/01/2011   Dyslipidemia associated with type 2 diabetes mellitus (HCC)    Edema    edema in lower legs   Hyperlipemia    Hypertension 09/01/2011   Kyphosis deformity of spine    Lung disease    Melanoma (HCC)    on back   Past Surgical History:  Procedure Laterality Date   HERNIA REPAIR     1960's   MEDIAL PARTIAL KNEE REPLACEMENT  2015   melanoma removed from back  2008   sub mucus  2015   Social History:   reports that he quit smoking about 57 years ago. His smoking use included cigarettes. He started smoking about 67 years ago. He has never used smokeless tobacco. He reports that he does not currently use alcohol. He reports that he does not use drugs.  Family History  Problem Relation Age of Onset   Pancreatic cancer Father    Stomach cancer Sister    Hypertension Maternal Grandmother    Stroke Maternal Grandfather    Heart disease Paternal Grandmother     Medications: Patient's Medications  New Prescriptions   No medications on file  Previous Medications   ACCU-CHEK GUIDE TEST STRIP       ALBUTEROL (VENTOLIN HFA) 108 (90 BASE) MCG/ACT INHALER    Inhale 1 puff into the lungs as needed for wheezing or shortness of breath.   ASPIRIN 81 MG TABLET    Take 81 mg by mouth daily.  FINASTERIDE (PROSCAR) 5 MG TABLET    Take 5 mg by mouth daily.   FLUTICASONE (FLONASE) 50 MCG/ACT NASAL SPRAY    Place 1 spray into both nostrils daily.   FUROSEMIDE (LASIX) 40 MG TABLET    TAKE 1 TABLET BY MOUTH EVERY DAY   JARDIANCE 10 MG TABS TABLET    TAKE 1 TABLET BY MOUTH EVERY DAY   LISINOPRIL (ZESTRIL) 5 MG TABLET    Take 1 tablet (5 mg total) by mouth daily.   LORATADINE (CLARITIN) 10 MG TABLET    Take 1 tablet (10 mg total) by mouth daily.   MAGNESIUM HYDROXIDE (MILK OF MAGNESIA) 400 MG/5ML SUSPENSION    1/2 capful by mouth every night    METFORMIN (GLUCOPHAGE) 500 MG TABLET    TAKE 1 TABLET BY MOUTH TWICE A DAY WITH A MEAL   SILVER SULFADIAZINE (SILVADENE) 1 % CREAM    Apply 1 Application topically as needed. To legs for lesions   TRIAMCINOLONE CREAM (KENALOG) 0.1 %    APPLY TO AFFECTED AREA TWICE A DAY  Modified Medications   No medications on file  Discontinued Medications   POTASSIUM CHLORIDE SA (KLOR-CON M20) 20 MEQ TABLET    Take 1 tablet (20 mEq total) by mouth daily.    Physical Exam:  Vitals:   02/03/23 1304  BP: 118/70  Pulse: 94  Resp: 17  Temp: (!) 97.3 F (36.3 C)  SpO2: 94%  Weight: 227 lb 12.8 oz (103.3 kg)  Height: 6' (1.829 m)   Body mass index is 30.9 kg/m. Wt Readings from Last 3 Encounters:  02/03/23 227 lb 12.8 oz (103.3 kg)  12/16/22 229 lb (103.9 kg)  12/09/22 230 lb 3.2 oz (104.4 kg)    Physical Exam Constitutional:      Appearance: Normal appearance.  HENT:     Head: Normocephalic and atraumatic.  Cardiovascular:     Rate and Rhythm: Normal rate and regular rhythm.     Pulses: Normal pulses.     Heart sounds: Normal heart sounds.  Pulmonary:     Effort: No respiratory distress.     Breath sounds: No stridor. No wheezing or rales.  Abdominal:     General: Bowel sounds are normal. There is no distension.     Palpations: Abdomen is soft.     Tenderness: There is no abdominal tenderness. There is no right CVA tenderness or guarding.  Musculoskeletal:        General: Swelling present.     Comments: Rt worse than the left  Rt lower leg- 2 open wounds about 2x3 cm with yellowish sanguinous discharge  Slightly warm Non tender   Neurological:     Mental Status: He is alert. Mental status is at baseline.     Sensory: No sensory deficit.     Motor: No weakness.     Labs reviewed: Basic Metabolic Panel: Recent Labs    05/26/22 1438 12/09/22 1345  NA 141 140  K 4.7 4.6  CL 101 100  CO2 30 31  GLUCOSE 115* 134*  BUN 15 18  CREATININE 0.86 0.76  CALCIUM 9.6 9.9    Liver Function Tests: No results for input(s): "AST", "ALT", "ALKPHOS", "BILITOT", "PROT", "ALBUMIN" in the last 8760 hours. No results for input(s): "LIPASE", "AMYLASE" in the last 8760 hours. No results for input(s): "AMMONIA" in the last 8760 hours. CBC: Recent Labs    05/26/22 1438  WBC 9.8  NEUTROABS 7,752  HGB 16.1  HCT 48.5  MCV  97.0  PLT 236   Lipid Panel: No results for input(s): "CHOL", "HDL", "LDLCALC", "TRIG", "CHOLHDL", "LDLDIRECT" in the last 8760 hours. TSH: No results for input(s): "TSH" in the last 8760 hours. A1C: Lab Results  Component Value Date   HGBA1C 7.1 (H) 12/09/2022     Assessment/Plan  1. Cellulitis of right lower extremity Pt noted with increased redness, swelling  Open wounds on the rt lower leg with 2x 3 cm with serosanguinous drainage  Will check cbc, bmp Will start augmentin  - CBC With Differential/Platelet - Basic Metabolic Panel with eGFR  2. Pain and swelling of right lower extremity Will increase lasix to 40 mg twice daily  Avoid salty foods Will get u/s to r/o DVT  - US Venous Img Lower Unilateral Right; Future  Other orders - amoxicillin-clavulanate (AUGMENTIN) 875-125 MG tablet; Take 1 tablet by mouth 2 (two) times daily.  Dispense: 20 tablet; Refill: 0   No follow-ups on file.: 10 days

## 2023-02-04 ENCOUNTER — Encounter: Payer: Self-pay | Admitting: Sports Medicine

## 2023-02-04 ENCOUNTER — Other Ambulatory Visit: Payer: Self-pay | Admitting: Sports Medicine

## 2023-02-04 LAB — BASIC METABOLIC PANEL WITH GFR
BUN: 24 mg/dL (ref 7–25)
CO2: 33 mmol/L — ABNORMAL HIGH (ref 20–32)
Calcium: 10.2 mg/dL (ref 8.6–10.3)
Chloride: 101 mmol/L (ref 98–110)
Creat: 0.84 mg/dL (ref 0.70–1.22)
Glucose, Bld: 155 mg/dL — ABNORMAL HIGH (ref 65–99)
Potassium: 5.5 mmol/L — ABNORMAL HIGH (ref 3.5–5.3)
Sodium: 142 mmol/L (ref 135–146)
eGFR: 83 mL/min/{1.73_m2} (ref >=60)

## 2023-02-04 LAB — CBC WITH DIFFERENTIAL/PLATELET
Absolute Lymphocytes: 1104 {cells}/uL (ref 850–3900)
Absolute Monocytes: 888 {cells}/uL (ref 200–950)
Basophils Absolute: 33 {cells}/uL (ref 0–200)
Basophils Relative: 0.4 %
Eosinophils Absolute: 17 {cells}/uL (ref 15–500)
Eosinophils Relative: 0.2 %
HCT: 48.9 % (ref 38.5–50.0)
Hemoglobin: 16.4 g/dL (ref 13.2–17.1)
MCH: 33.1 pg — ABNORMAL HIGH (ref 27.0–33.0)
MCHC: 33.5 g/dL (ref 32.0–36.0)
MCV: 98.8 fL (ref 80.0–100.0)
MPV: 10.8 fL (ref 7.5–12.5)
Monocytes Relative: 10.7 %
Neutro Abs: 6258 {cells}/uL (ref 1500–7800)
Neutrophils Relative %: 75.4 %
Platelets: 214 10*3/uL (ref 140–400)
RBC: 4.95 10*6/uL (ref 4.20–5.80)
RDW: 12 % (ref 11.0–15.0)
Total Lymphocyte: 13.3 %
WBC: 8.3 10*3/uL (ref 3.8–10.8)

## 2023-02-04 MED ORDER — SODIUM POLYSTYRENE SULFONATE 15 GM/60ML PO SUSP: 15.0000 g | Freq: Once | ORAL | 0 refills | Status: AC

## 2023-02-05 ENCOUNTER — Ambulatory Visit
Admission: RE | Admit: 2023-02-05 | Discharge: 2023-02-05 | Disposition: A | Discharge status: Home / Self Care | Payer: Medicare (Managed Care) | Source: Ambulatory Visit | Attending: Sports Medicine | Admitting: Sports Medicine

## 2023-02-05 DIAGNOSIS — M79604 Pain in right leg: Secondary | ICD-10-CM | POA: Diagnosis not present

## 2023-02-05 DIAGNOSIS — M7989 Other specified soft tissue disorders: Secondary | ICD-10-CM

## 2023-02-05 DIAGNOSIS — R600 Localized edema: Secondary | ICD-10-CM | POA: Diagnosis not present

## 2023-02-09 ENCOUNTER — Telehealth: Payer: Self-pay | Admitting: Nurse Practitioner

## 2023-02-09 NOTE — Telephone Encounter (Signed)
Called 02/08/23 reported the patient's back of the right lower leg has been the worst looking ever, denied pain, no odorous drainage, or heat. Negative DVT 02/05/23 per venous US, did not respond to started increased Furosemide, topical bacitracin, and started Augment 02/03/23. The patient was advised to urgent care for immediate care needs if needed.

## 2023-02-11 ENCOUNTER — Ambulatory Visit (INDEPENDENT_AMBULATORY_CARE_PROVIDER_SITE_OTHER): Payer: Medicare (Managed Care) | Admitting: Sports Medicine

## 2023-02-11 ENCOUNTER — Telehealth: Payer: Self-pay

## 2023-02-11 ENCOUNTER — Encounter: Payer: Self-pay | Admitting: Sports Medicine

## 2023-02-11 VITALS — BP 138/70 | HR 92 | Temp 96.9°F | Resp 17 | Ht 72.0 in | Wt 223.0 lb

## 2023-02-11 DIAGNOSIS — E11610 Type 2 diabetes mellitus with diabetic neuropathic arthropathy: Secondary | ICD-10-CM | POA: Diagnosis not present

## 2023-02-11 DIAGNOSIS — E875 Hyperkalemia: Secondary | ICD-10-CM

## 2023-02-11 DIAGNOSIS — L97912 Non-pressure chronic ulcer of unspecified part of right lower leg with fat layer exposed: Secondary | ICD-10-CM | POA: Diagnosis not present

## 2023-02-11 DIAGNOSIS — M7989 Other specified soft tissue disorders: Secondary | ICD-10-CM

## 2023-02-11 DIAGNOSIS — I890 Lymphedema, not elsewhere classified: Secondary | ICD-10-CM

## 2023-02-11 MED ORDER — FUROSEMIDE 40 MG PO TABS: 40.0000 mg | ORAL_TABLET | Freq: Every day | ORAL | 3 refills | Status: DC

## 2023-02-11 MED ORDER — METFORMIN HCL 500 MG PO TABS: 500.0000 mg | ORAL_TABLET | Freq: Two times a day (BID) | ORAL | 3 refills | Status: DC

## 2023-02-11 NOTE — Progress Notes (Signed)
Careteam: Patient Care Team: Venita Sheffield, MD as PCP - General (Internal Medicine) Esaw Dace, MD as Attending Physician (Urology) Loletha Carrow, MD (Ophthalmology) Naida Sleight, MD as Referring Physician  PLACE OF SERVICE:  [facility]  Advanced Directive information    Allergies  Allergen Reactions   Lopressor [Metoprolol Tartrate]     Mental status changes (intolerance)    Beta Adrenergic Blockers     "they make me depressed"    Chief Complaint  Patient presents with   Acute Visit    Patient wants legs checked, and potassium rechecked.      HPI: Patient is a 89 y.o. male is here for follow up  on his lower extremity wound Accompanied by her daughter and care giver  Pt was recently evaluated for cellulitis  Pt had u/s which did not show DVT  U/s . No evidence of DVT within right lower extremity. 2. Venous reflux noted within the right greater saphenous vein as we seen in the setting of venous insufficiency.    Latest Ref Rng & Units 02/03/2023    2:06 PM 05/26/2022    2:38 PM 05/25/2020   12:29 PM  CBC  WBC 3.8 - 10.8 Thousand/uL 8.3  9.8  7.5   Hemoglobin 13.2 - 17.1 g/dL 16.1  09.6  04.5   Hematocrit 38.5 - 50.0 % 48.9  48.5  49.6   Platelets 140 - 400 Thousand/uL 214  236  228        Latest Ref Rng & Units 02/03/2023    2:06 PM 12/09/2022    1:45 PM 05/26/2022    2:38 PM  BMP  Glucose 65 - 99 mg/dL 409  811  914   BUN 7 - 25 mg/dL 24  18  15    Creatinine 0.70 - 1.22 mg/dL 7.82  9.56  2.13   BUN/Creat Ratio 6 - 22 (calc) SEE NOTE:  SEE NOTE:  SEE NOTE:   Sodium 135 - 146 mmol/L 142  140  141   Potassium 3.5 - 5.3 mmol/L 5.5  4.6  4.7   Chloride 98 - 110 mmol/L 101  100  101   CO2 20 - 32 mmol/L 33  31  30   Calcium 8.6 - 10.3 mg/dL 08.6  9.9  9.6     CBC did not reveal leukocytosis   Hyperkalemia  K 5.5 on last check.   Pt is able to speak in full sentences Does not appear to be in distress Denies exertional SOB   Ulcer  got worse in the Rt lower leg  Still has 5 more days of abx Has some drainage  Has care take who does dressing change daily  Denies fevers, chills Family reports that he eats salty foods   Review of Systems:  Review of Systems  Constitutional:  Negative for chills and fever.  HENT:  Negative for congestion and sore throat.   Eyes:  Negative for double vision.  Respiratory:  Negative for cough, sputum production and shortness of breath.   Cardiovascular:  Positive for leg swelling. Negative for chest pain and palpitations.  Gastrointestinal:  Negative for abdominal pain, heartburn and nausea.  Genitourinary:  Negative for dysuria, frequency and hematuria.  Musculoskeletal:  Negative for falls and myalgias.  Skin:        Wounds on his Rt leg   Neurological:  Negative for dizziness, sensory change and focal weakness.    Past Medical History:  Diagnosis Date   Benign  prostatic hyperplasia with urinary obstruction    Bladder neck obstruction 02/02/2012   Colon polyps 09/01/2011   Depression    Diabetes mellitus without complication (HCC) 09/01/2011   Dyslipidemia associated with type 2 diabetes mellitus (HCC)    Edema    edema in lower legs   Hyperlipemia    Hypertension 09/01/2011   Kyphosis deformity of spine    Lung disease    Melanoma (HCC)    on back   Past Surgical History:  Procedure Laterality Date   HERNIA REPAIR     1960's   MEDIAL PARTIAL KNEE REPLACEMENT  2015   melanoma removed from back  2008   sub mucus  2015   Social History:   reports that he quit smoking about 57 years ago. His smoking use included cigarettes. He started smoking about 67 years ago. He has never used smokeless tobacco. He reports that he does not currently use alcohol. He reports that he does not use drugs.  Family History  Problem Relation Age of Onset   Pancreatic cancer Father    Stomach cancer Sister    Hypertension Maternal Grandmother    Stroke Maternal Grandfather    Heart  disease Paternal Grandmother     Medications: Patient's Medications  New Prescriptions   No medications on file  Previous Medications   ACCU-CHEK GUIDE TEST STRIP       ALBUTEROL (VENTOLIN HFA) 108 (90 BASE) MCG/ACT INHALER    Inhale 1 puff into the lungs as needed for wheezing or shortness of breath.   AMOXICILLIN-CLAVULANATE (AUGMENTIN) 875-125 MG TABLET    Take 1 tablet by mouth 2 (two) times daily.   ASPIRIN 81 MG TABLET    Take 81 mg by mouth daily.   FINASTERIDE (PROSCAR) 5 MG TABLET    Take 5 mg by mouth daily.   FLUTICASONE (FLONASE) 50 MCG/ACT NASAL SPRAY    Place 1 spray into both nostrils daily.   FUROSEMIDE (LASIX) 40 MG TABLET    TAKE 1 TABLET BY MOUTH EVERY DAY   JARDIANCE 10 MG TABS TABLET    TAKE 1 TABLET BY MOUTH EVERY DAY   LISINOPRIL (ZESTRIL) 5 MG TABLET    Take 1 tablet (5 mg total) by mouth daily.   LORATADINE (CLARITIN) 10 MG TABLET    Take 1 tablet (10 mg total) by mouth daily.   METFORMIN (GLUCOPHAGE) 500 MG TABLET    TAKE 1 TABLET BY MOUTH TWICE A DAY WITH A MEAL   SILVER SULFADIAZINE (SILVADENE) 1 % CREAM    Apply 1 Application topically as needed. To legs for lesions   TRIAMCINOLONE CREAM (KENALOG) 0.1 %    APPLY TO AFFECTED AREA TWICE A DAY  Modified Medications   No medications on file  Discontinued Medications   No medications on file    Physical Exam:  There were no vitals filed for this visit. There is no height or weight on file to calculate BMI. Wt Readings from Last 3 Encounters:  02/03/23 227 lb 12.8 oz (103.3 kg)  12/16/22 229 lb (103.9 kg)  12/09/22 230 lb 3.2 oz (104.4 kg)    Physical Exam Constitutional:      Appearance: Normal appearance.  HENT:     Head: Normocephalic and atraumatic.  Cardiovascular:     Rate and Rhythm: Normal rate and regular rhythm.     Pulses: Normal pulses.     Heart sounds: Normal heart sounds.  Pulmonary:     Effort: No respiratory distress.  Breath sounds: No stridor. No wheezing or rales.   Abdominal:     General: Bowel sounds are normal. There is no distension.     Palpations: Abdomen is soft.     Tenderness: There is no abdominal tenderness. There is no right CVA tenderness or guarding.  Musculoskeletal:        General: Swelling present.     Comments: Open wound on Rt lower leg - 2 separate wounds 3x4 cm open, with granulation tissue No drainage  No tenderness on palpation   Neurological:     Mental Status: He is alert. Mental status is at baseline.     Sensory: No sensory deficit.     Motor: No weakness.         Labs reviewed: Basic Metabolic Panel: Recent Labs    05/26/22 1438 12/09/22 1345 02/03/23 1406  NA 141 140 142  K 4.7 4.6 5.5*  CL 101 100 101  CO2 30 31 33*  GLUCOSE 115* 134* 155*  BUN 15 18 24   CREATININE 0.86 0.76 0.84  CALCIUM 9.6 9.9 10.2   Liver Function Tests: No results for input(s): "AST", "ALT", "ALKPHOS", "BILITOT", "PROT", "ALBUMIN" in the last 8760 hours. No results for input(s): "LIPASE", "AMYLASE" in the last 8760 hours. No results for input(s): "AMMONIA" in the last 8760 hours. CBC: Recent Labs    05/26/22 1438 02/03/23 1406  WBC 9.8 8.3  NEUTROABS 7,752 6,258  HGB 16.1 16.4  HCT 48.5 48.9  MCV 97.0 98.8  PLT 236 214   Lipid Panel: No results for input(s): "CHOL", "HDL", "LDLCALC", "TRIG", "CHOLHDL", "LDLDIRECT" in the last 8760 hours. TSH: No results for input(s): "TSH" in the last 8760 hours. A1C: Lab Results  Component Value Date   HGBA1C 7.1 (H) 12/09/2022     Assessment/Plan   1. Ulcer of right lower extremity with fat layer exposed (HCC)  Ulcer with granulation tissue  No drainage  No tenderness Cont with augmentin  Will refer to wound clinic Instructed care taker to apply bacitracin, cover with xeroform and wrap it with kerlix  - Basic Metabolic Panel with eGFR - Ambulatory referral to Wound Clinic  2. Swelling of lower extremity  Elevate feet  Avoid salty foods Family reports that pt  does not follow dietary recommendation and eats lot of salty foods and sweets  - Basic Metabolic Panel with eGFR - Amb ref to Medical Nutrition Therapy-MNT   Hyperkalemia Will repeat K levels  No follow-ups on file.: 2 months  43  Total time spent for obtaining history,  performing a medically appropriate examination and evaluation, reviewing the tests, counseling and educating the patient/family/caregiver, ordering medications, tests,   documenting clinical information in the electronic or other health record, independently interpreting results and communicating results to the patient/family/caregiver, care coordination (not separately reported)

## 2023-02-11 NOTE — Telephone Encounter (Signed)
Patient requested an rx for Metformin and Lasix

## 2023-02-12 LAB — BASIC METABOLIC PANEL WITHOUT GFR
BUN: 23 mg/dL (ref 7–25)
CO2: 34 mmol/L — ABNORMAL HIGH (ref 20–32)
Calcium: 9.9 mg/dL (ref 8.6–10.3)
Chloride: 98 mmol/L (ref 98–110)
Creat: 0.92 mg/dL (ref 0.70–1.22)
Glucose, Bld: 218 mg/dL — ABNORMAL HIGH (ref 65–139)
Potassium: 4.5 mmol/L (ref 3.5–5.3)
Sodium: 139 mmol/L (ref 135–146)
eGFR: 80 mL/min/1.73m2

## 2023-02-18 NOTE — Addendum Note (Signed)
Addended by: Venita Sheffield on: 02/18/2023 12:49 PM   Modules accepted: Orders

## 2023-02-24 DIAGNOSIS — B354 Tinea corporis: Secondary | ICD-10-CM | POA: Diagnosis not present

## 2023-02-24 DIAGNOSIS — D485 Neoplasm of uncertain behavior of skin: Secondary | ICD-10-CM | POA: Diagnosis not present

## 2023-02-24 DIAGNOSIS — D044 Carcinoma in situ of skin of scalp and neck: Secondary | ICD-10-CM | POA: Diagnosis not present

## 2023-02-24 DIAGNOSIS — D492 Neoplasm of unspecified behavior of bone, soft tissue, and skin: Secondary | ICD-10-CM | POA: Diagnosis not present

## 2023-02-24 DIAGNOSIS — L570 Actinic keratosis: Secondary | ICD-10-CM | POA: Diagnosis not present

## 2023-02-24 DIAGNOSIS — L538 Other specified erythematous conditions: Secondary | ICD-10-CM | POA: Diagnosis not present

## 2023-02-27 ENCOUNTER — Ambulatory Visit: Payer: Medicare (Managed Care) | Admitting: Dietician

## 2023-03-09 DIAGNOSIS — L97919 Non-pressure chronic ulcer of unspecified part of right lower leg with unspecified severity: Secondary | ICD-10-CM | POA: Diagnosis not present

## 2023-03-10 ENCOUNTER — Ambulatory Visit: Payer: Medicare (Managed Care) | Admitting: Sports Medicine

## 2023-03-11 DIAGNOSIS — L97929 Non-pressure chronic ulcer of unspecified part of left lower leg with unspecified severity: Secondary | ICD-10-CM | POA: Diagnosis not present

## 2023-03-11 DIAGNOSIS — I83019 Varicose veins of right lower extremity with ulcer of unspecified site: Secondary | ICD-10-CM | POA: Diagnosis not present

## 2023-03-11 DIAGNOSIS — L97919 Non-pressure chronic ulcer of unspecified part of right lower leg with unspecified severity: Secondary | ICD-10-CM | POA: Diagnosis not present

## 2023-03-11 DIAGNOSIS — E08621 Diabetes mellitus due to underlying condition with foot ulcer: Secondary | ICD-10-CM | POA: Diagnosis not present

## 2023-03-11 DIAGNOSIS — I83029 Varicose veins of left lower extremity with ulcer of unspecified site: Secondary | ICD-10-CM | POA: Diagnosis not present

## 2023-03-11 DIAGNOSIS — L02612 Cutaneous abscess of left foot: Secondary | ICD-10-CM | POA: Diagnosis not present

## 2023-03-13 ENCOUNTER — Emergency Department (HOSPITAL_COMMUNITY)
Admission: EM | Admit: 2023-03-13 | Discharge: 2023-03-13 | Disposition: A | Discharge status: Home / Self Care | Payer: Medicare (Managed Care) | Attending: Emergency Medicine | Admitting: Emergency Medicine

## 2023-03-13 ENCOUNTER — Other Ambulatory Visit: Payer: Self-pay

## 2023-03-13 ENCOUNTER — Encounter (HOSPITAL_COMMUNITY): Payer: Self-pay

## 2023-03-13 DIAGNOSIS — E119 Type 2 diabetes mellitus without complications: Secondary | ICD-10-CM | POA: Diagnosis not present

## 2023-03-13 DIAGNOSIS — I10 Essential (primary) hypertension: Secondary | ICD-10-CM | POA: Diagnosis not present

## 2023-03-13 DIAGNOSIS — Z7984 Long term (current) use of oral hypoglycemic drugs: Secondary | ICD-10-CM | POA: Diagnosis not present

## 2023-03-13 DIAGNOSIS — Z5189 Encounter for other specified aftercare: Secondary | ICD-10-CM | POA: Diagnosis not present

## 2023-03-13 DIAGNOSIS — R2243 Localized swelling, mass and lump, lower limb, bilateral: Secondary | ICD-10-CM | POA: Insufficient documentation

## 2023-03-13 DIAGNOSIS — Z7982 Long term (current) use of aspirin: Secondary | ICD-10-CM | POA: Diagnosis not present

## 2023-03-13 NOTE — Discharge Instructions (Signed)
Please continue taking antibiotic as previously prescribed and follow-up closely with your wound care center for outpatient managements of your condition.  Return if you have any concern.

## 2023-03-13 NOTE — ED Provider Notes (Signed)
[place] EMERGENCY DEPARTMENT AT [facility] Provider Note   CSN: [id number] Arrival date & time: 03/13/23  6962     History  Chief Complaint  Patient presents with   Wound Infection    Brian [name] is a 89 y.o. male.  The history is provided by the patient, a relative and medical records. No language interpreter was used.     89 year old male with significant history of diabetes, hypertension, hyperlipidemia presents ER with concerns of foot infection.  Patient has been noted bilateral swelling of his legs ongoing for the past month.  He also has a bunion in his left foot.  He was seen at the wound care center 4 days ago for his bunion and he report they noted a wound at the bunion site.  He has his left foot and leg wrapped.  He went back 2 days later and when they unwrapped it and it was reported that there is a pocket of infection at the bunion site.  Patient was placed on doxycycline for which he has been taking a total of 3 doses.  Today he the wound care center mention that the patient should come to the ER for IV antibiotic.  Patient however states that he is not having any worsening pain no fever or chills no nausea or vomiting.  Home Medications Prior to Admission medications   Medication Sig Start Date End Date Taking? Authorizing Provider  ACCU-CHEK GUIDE test strip  07/22/19   [provider]  albuterol (VENTOLIN HFA) 108 (90 Base) MCG/ACT inhaler Inhale 1 puff into the lungs as needed for wheezing or shortness of breath.    [provider]  amoxicillin-clavulanate (AUGMENTIN) 875-125 MG tablet Take 1 tablet by mouth 2 (two) times daily. 02/03/23   Venita Sheffield, MD  aspirin 81 MG tablet Take 81 mg by mouth daily.    [provider]  finasteride (PROSCAR) 5 MG tablet Take 5 mg by mouth daily.    [provider]  fluticasone (FLONASE) 50 MCG/ACT nasal spray Place 1 spray into both nostrils daily.    [provider]  furosemide (LASIX) 40 MG tablet Take 1 tablet (40 mg total) by mouth daily. 02/11/23   Venita Sheffield, MD  JARDIANCE 10 MG TABS tablet TAKE 1 TABLET BY MOUTH EVERY DAY 11/14/22   Venita Sheffield, MD  lisinopril (ZESTRIL) 5 MG tablet Take 1 tablet (5 mg total) by mouth daily. 12/10/22   Venita Sheffield, MD  loratadine (CLARITIN) 10 MG tablet Take 1 tablet (10 mg total) by mouth daily. 12/09/22   Venita Sheffield, MD  metFORMIN (GLUCOPHAGE) 500 MG tablet Take 1 tablet (500 mg total) by mouth 2 (two) times daily with a meal. 02/11/23   Venita Sheffield, MD  silver sulfADIAZINE (SILVADENE) 1 % cream Apply 1 Application topically as needed. To legs for lesions    [provider]  triamcinolone cream (KENALOG) 0.1 % APPLY TO AFFECTED AREA TWICE A DAY 05/14/22   Frederica Kuster, MD      Allergies    Lopressor [metoprolol tartrate] and Beta adrenergic blockers    Review of Systems   Review of Systems  All other systems reviewed and are negative.   Physical Exam Updated Vital Signs BP (!) 151/79 (BP Location: Right Arm)   Pulse 91   Temp 97.8 F (36.6 C) (Oral)   Resp 18   Ht 6' (1.829 m)   Wt 102.1 kg   SpO2 93%   BMI  30.52 kg/m  Physical Exam Vitals and nursing note reviewed.  Constitutional:      General: He is not in acute distress.    Appearance: He is well-developed. He is obese.  HENT:     Head: Atraumatic.  Eyes:     Conjunctiva/sclera: Conjunctivae normal.  Cardiovascular:     Rate and Rhythm: Normal rate and regular rhythm.     Pulses: Normal pulses.     Heart sounds: Normal heart sounds.  Pulmonary:     Effort: Pulmonary effort is normal.     Breath sounds: Normal breath sounds.  Abdominal:     Palpations: Abdomen is soft.     Tenderness: There is no abdominal tenderness.  Musculoskeletal:     Cervical back: Neck supple.  Skin:    Findings: No rash.     Comments: Bilateral lower extremities: Evidence of chronic  venous stasis involving both lower extremities.  Left leg was wrapped in Coban and dressing.  Dressing was removed, patient has a small bunion to his left foot that is nontender to palpation nothing expressed out of it no abscess no significant erythema.  Pedal pulse 2+  Neurological:     Mental Status: He is alert.     ED Results / Procedures / Treatments   Labs (all labs ordered are listed, but only abnormal results are displayed) Labs Reviewed - No data to display  EKG None  Radiology No results found.  Procedures Procedures    Medications Ordered in ED Medications - No data to display  ED Course/ Medical Decision Making/ A&P                                 Medical Decision Making  BP 132/76 (BP Location: Left Arm)   Pulse 86   Temp 98.3 F (36.8 C) (Oral)   Resp 17   Ht 6' (1.829 m)   Wt 102.1 kg   SpO2 92%   BMI 30.52 kg/m   46:56 PM 89 year old male with significant history of diabetes, hypertension, hyperlipidemia presents ER with concerns of foot infection.  Patient has been noted bilateral swelling of his legs ongoing for the past month.  He also has a bunion in his left foot.  He was seen at the wound care center 4 days ago for his bunion and he report they noted a wound at the bunion site.  He has his left foot and leg wrapped.  He went back 2 days later and when they unwrapped it and it was reported that there is a pocket of infection at the bunion site.  Patient was placed on doxycycline for which he has been taking a total of 3 doses.  Today he the wound care center mention that the patient should come to the ER for IV antibiotic.  Patient however states that he is not having any worsening pain no fever or chills no nausea or vomiting.  On exam patient is resting comfortably in bed appears to be in no acute discomfort.  He wears supplemental oxygen which is at his baseline.  Examination of his lower extremity is significant for chronic venous stasis which is  not new.  He has a bunion to his left foot that is nontender and does not appear to be infected.  His pedal pulses palpable and intact cap refills.  Vital sign overall reassuring no fever no hypoxia.  At this time I felt patient does  not need IV antibiotics or hospital admission as he has just started on his oral antibiotic.  There is no abscess amenable for drainage.  Low suspicion for osteomyelitis as there were no significant tenderness to palpation and he is not febrile.  Will have wound redressed and encourage patient to continue with his current p.o. antibiotic and outpatient follow-up with the wound care center.  Care discussed with Dr. Andria Meuse who agrees with plan.        Final Clinical Impression(s) / ED Diagnoses Final diagnoses:  Visit for wound check    Rx / DC Orders ED Discharge Orders     None         Fayrene Helper, PA-C 03/13/23 1639    Elayne Snare K, DO 03/13/23 1642

## 2023-03-13 NOTE — ED Triage Notes (Signed)
Pt referred to the ER by wound care. Concerned pt may have a staph infection on his feet, started pt on oral abx, but wanted pt to be seen and receive IV abx. Pt denies any pain.

## 2023-04-14 ENCOUNTER — Ambulatory Visit (INDEPENDENT_AMBULATORY_CARE_PROVIDER_SITE_OTHER): Payer: Medicare Other | Admitting: Sports Medicine

## 2023-04-14 ENCOUNTER — Encounter: Payer: Self-pay | Admitting: Sports Medicine

## 2023-04-14 VITALS — BP 131/88 | HR 83 | Temp 97.5°F | Resp 18 | Ht 72.0 in | Wt 227.6 lb

## 2023-04-14 DIAGNOSIS — K5900 Constipation, unspecified: Secondary | ICD-10-CM

## 2023-04-14 DIAGNOSIS — E11610 Type 2 diabetes mellitus with diabetic neuropathic arthropathy: Secondary | ICD-10-CM | POA: Diagnosis not present

## 2023-04-14 DIAGNOSIS — I10 Essential (primary) hypertension: Secondary | ICD-10-CM | POA: Diagnosis not present

## 2023-04-14 DIAGNOSIS — M7989 Other specified soft tissue disorders: Secondary | ICD-10-CM | POA: Diagnosis not present

## 2023-04-14 DIAGNOSIS — M21619 Bunion of unspecified foot: Secondary | ICD-10-CM | POA: Diagnosis not present

## 2023-04-14 NOTE — Progress Notes (Signed)
Careteam: Patient Care Team: Venita Sheffield, MD as PCP - General (Internal Medicine) Esaw Dace, MD as Attending Physician (Urology) Loletha Carrow, MD (Ophthalmology) Naida Sleight, MD as Referring Physician  PLACE OF SERVICE:  [facility]  Advanced Directive information    Allergies  Allergen Reactions   Lopressor [Metoprolol Tartrate]     Mental status changes (intolerance)    Beta Adrenergic Blockers     "they make me depressed"    Chief Complaint  Patient presents with   Medical Management of Chronic Issues    2 month follow-up   Immunizations    Covid and Shingrix      Discussed the use of AI scribe software for clinical note transcription with the patient, who gave verbal consent to proceed.  History of Present Illness   The patient, with a history of diabetes, hypertension, and chronic leg wounds, presents for a follow-up visit.    Wound on RT heel He reports that the wound doctor has recently discharged him.  Lower extremity swelling   The patient's legs are noted to be swollen, more so than usual, but he reports no associated pain. He has been wearing compression stockings during the day. He has an upcoming appointment with a podiatrist for evaluation of bunions.  The patient denies any chest pain, shortness of breath, or difficulty sleeping flat. He reports a slight cough and huskiness in his voice, attributed to recent drywall work in his home.    Constipation - pt reports passing hard stools Takes milk of magnesia and colace   HTN  Denies feeling dizzy or lightheaded  DM  Did not bring his meds Does not remember the names      Review of Systems:  Review of Systems  Constitutional:  Negative for chills and fever.  HENT:  Negative for congestion and sore throat.   Eyes:  Negative for double vision.  Respiratory:  Negative for cough, sputum production and shortness of breath.   Cardiovascular:  Positive for leg swelling.  Negative for chest pain and palpitations.  Gastrointestinal:  Negative for abdominal pain, heartburn and nausea.  Genitourinary:  Negative for dysuria, frequency and hematuria.  Musculoskeletal:  Negative for falls and myalgias.  Neurological:  Negative for dizziness, sensory change and focal weakness.   Negative unless indicated in HPI.   Past Medical History:  Diagnosis Date   Benign prostatic hyperplasia with urinary obstruction    Bladder neck obstruction 02/02/2012   Colon polyps 09/01/2011   Depression    Diabetes mellitus without complication (HCC) 09/01/2011   Dyslipidemia associated with type 2 diabetes mellitus (HCC)    Edema    edema in lower legs   Hyperlipemia    Hypertension 09/01/2011   Kyphosis deformity of spine    Lung disease    Melanoma (HCC)    on back   Past Surgical History:  Procedure Laterality Date   HERNIA REPAIR     1960's   MEDIAL PARTIAL KNEE REPLACEMENT  2015   melanoma removed from back  2008   sub mucus  2015   Social History:   reports that he quit smoking about 58 years ago. His smoking use included cigarettes. He started smoking about 68 years ago. He has never used smokeless tobacco. He reports that he does not currently use alcohol. He reports that he does not use drugs.  Family History  Problem Relation Age of Onset   Pancreatic cancer Father    Stomach cancer Sister  Hypertension Maternal Grandmother    Stroke Maternal Grandfather    Heart disease Paternal Grandmother     Medications: Patient's Medications  New Prescriptions   No medications on file  Previous Medications   ACCU-CHEK GUIDE TEST STRIP       ALBUTEROL (VENTOLIN HFA) 108 (90 BASE) MCG/ACT INHALER    Inhale 1 puff into the lungs as needed for wheezing or shortness of breath.   ASPIRIN 81 MG TABLET    Take 81 mg by mouth daily.   FINASTERIDE (PROSCAR) 5 MG TABLET    Take 5 mg by mouth daily.   FLUTICASONE (FLONASE) 50 MCG/ACT NASAL SPRAY    Place 1 spray into  both nostrils daily.   FUROSEMIDE (LASIX) 40 MG TABLET    Take 1 tablet (40 mg total) by mouth daily.   JARDIANCE 10 MG TABS TABLET    TAKE 1 TABLET BY MOUTH EVERY DAY   LISINOPRIL (ZESTRIL) 5 MG TABLET    Take 1 tablet (5 mg total) by mouth daily.   LORATADINE (CLARITIN) 10 MG TABLET    Take 1 tablet (10 mg total) by mouth daily.   METFORMIN (GLUCOPHAGE) 500 MG TABLET    Take 1 tablet (500 mg total) by mouth 2 (two) times daily with a meal.   SILVER SULFADIAZINE (SILVADENE) 1 % CREAM    Apply 1 Application topically as needed. To legs for lesions   TRIAMCINOLONE CREAM (KENALOG) 0.1 %    APPLY TO AFFECTED AREA TWICE A DAY  Modified Medications   No medications on file  Discontinued Medications   AMOXICILLIN-CLAVULANATE (AUGMENTIN) 875-125 MG TABLET    Take 1 tablet by mouth 2 (two) times daily.    Physical Exam: Vitals:   04/14/23 1312  BP: 131/88  Pulse: 83  Resp: 18  Temp: (!) 97.5 F (36.4 C)  SpO2: 92%  Weight: 227 lb 9.6 oz (103.2 kg)  Height: 6' (1.829 m)   Body mass index is 30.87 kg/m. BP Readings from Last 3 Encounters:  04/14/23 131/88  03/13/23 132/76  02/11/23 138/70   Wt Readings from Last 3 Encounters:  04/14/23 227 lb 9.6 oz (103.2 kg)  03/13/23 225 lb (102.1 kg)  02/11/23 223 lb (101.2 kg)    Physical Exam Constitutional:      Appearance: Normal appearance.  HENT:     Head: Normocephalic and atraumatic.  Cardiovascular:     Rate and Rhythm: Normal rate and regular rhythm.     Pulses: Normal pulses.     Heart sounds: Normal heart sounds.  Pulmonary:     Effort: No respiratory distress.     Breath sounds: No stridor. No wheezing or rales.  Abdominal:     General: Bowel sounds are normal. There is no distension.     Palpations: Abdomen is soft.     Tenderness: There is no abdominal tenderness. There is no right CVA tenderness or guarding.  Musculoskeletal:        General: Swelling present.     Comments: 1+ pitting odema  Neurological:     Mental  Status: He is alert. Mental status is at baseline.     Sensory: No sensory deficit.     Motor: No weakness.     Labs reviewed: Basic Metabolic Panel: Recent Labs    12/09/22 1345 02/03/23 1406 02/11/23 1155  NA 140 142 139  K 4.6 5.5* 4.5  CL 100 101 98  CO2 31 33* 34*  GLUCOSE 134* 155* 218*  BUN 18  24 23  CREATININE 0.76 0.84 0.92  CALCIUM 9.9 10.2 9.9   Liver Function Tests: No results for input(s): "AST", "ALT", "ALKPHOS", "BILITOT", "PROT", "ALBUMIN" in the last 8760 hours. No results for input(s): "LIPASE", "AMYLASE" in the last 8760 hours. No results for input(s): "AMMONIA" in the last 8760 hours. CBC: Recent Labs    05/26/22 1438 02/03/23 1406  WBC 9.8 8.3  NEUTROABS 7,752 6,258  HGB 16.1 16.4  HCT 48.5 48.9  MCV 97.0 98.8  PLT 236 214   Lipid Panel: No results for input(s): "CHOL", "HDL", "LDLCALC", "TRIG", "CHOLHDL", "LDLDIRECT" in the last 8760 hours. TSH: No results for input(s): "TSH" in the last 8760 hours. A1C: Lab Results  Component Value Date   HGBA1C 7.1 (H) 12/09/2022     Assessment/Plan  1. Essential hypertension (Primary) At goal  Cont with lisinopril  2. Type 2 diabetes mellitus with diabetic neuropathic arthropathy, without long-term current use of insulin (HCC) Lab Results  Component Value Date   HGBA1C 7.1 (H) 12/09/2022   HGBA1C 7.4 (H) 04/16/2022   HGBA1C 7.5 (H) 12/11/2021   Cont with metformin, jardiance  3. Bunion  Heel wound healed well . No current signs of infection.  -See podiatrist for bunion management on 04/30/2023.  4. Swelling of lower extremity Persistent, but improved from previous visit. Patient is compliant with compression stockings. -Continue compression stockings. -Elevate legs when sitting. Cont with lasix  -Reduce sodium intake.  5. Constipation, unspecified constipation type  Occasional bright red blood per rectum, likely secondary to hemorrhoids from straining. -Increase fiber intake and  hydration. Take colace, senna   Instructed patient to bring all his meds at next visit  Return in about 3 months (around 07/13/2023).:

## 2023-05-23 ENCOUNTER — Other Ambulatory Visit: Payer: Self-pay | Admitting: Sports Medicine

## 2023-07-14 ENCOUNTER — Ambulatory Visit (INDEPENDENT_AMBULATORY_CARE_PROVIDER_SITE_OTHER): Payer: Medicare Other | Admitting: Sports Medicine

## 2023-07-14 ENCOUNTER — Encounter: Payer: Self-pay | Admitting: Sports Medicine

## 2023-07-14 VITALS — BP 135/78 | HR 81 | Temp 97.7°F | Resp 18 | Ht 72.0 in | Wt 229.2 lb

## 2023-07-14 DIAGNOSIS — I890 Lymphedema, not elsewhere classified: Secondary | ICD-10-CM

## 2023-07-14 DIAGNOSIS — I10 Essential (primary) hypertension: Secondary | ICD-10-CM

## 2023-07-14 DIAGNOSIS — G8929 Other chronic pain: Secondary | ICD-10-CM

## 2023-07-14 DIAGNOSIS — M545 Low back pain, unspecified: Secondary | ICD-10-CM

## 2023-07-14 DIAGNOSIS — E11610 Type 2 diabetes mellitus with diabetic neuropathic arthropathy: Secondary | ICD-10-CM | POA: Diagnosis not present

## 2023-07-14 NOTE — Progress Notes (Signed)
 Careteam: Patient Care Team: Tye Gall, MD as PCP - General (Internal Medicine) Orvan Blanch, MD as Attending Physician (Urology) Ricki Charon, MD (Ophthalmology) Orlean Bitter, MD as Referring Physician  PLACE OF SERVICE:  [facility]  Advanced Directive information    Allergies  Allergen Reactions   Lopressor [Metoprolol Tartrate]     Mental status changes (intolerance)    Beta Adrenergic Blockers     "they make me depressed"    Chief Complaint  Patient presents with   Medical Management of Chronic Issues    3 month follow up. Discuss the need for Covid Booster, Shingrix, and Hemoglobin A1C.      Discussed the use of AI scribe software for clinical note transcription with the patient, who gave verbal consent to proceed.  History of Present Illness    Brian [name]. is an 89 year old male with hypertension and diabetes who presents for a routine follow-up visit.  He manages his medications independently, including lisinopril  for hypertension, Jardiance  for diabetes, loratadine  for allergies, and a potassium supplement due to Lasix  use. No dizziness or lightheadedness is reported, and blood pressure readings are stable. He does not experience significant memory concerns and is able to perform daily activities such as showering and using the microwave independently.  He  denies significant breathing difficulties. He sleeps on his side due to back discomfort , denies orthopnea, PND, has intermittent swelling in his legs which improves with elevation, and he uses a recliner and pillows to manage this. He does not wear compression stockings regularly.  He  denies significant gastrointestinal symptoms such as nausea or vomiting.    He drinks a lot of water and enjoys it. He denies recent falls     He acknowledges the need to increase physical activity, as he currently does not walk regularly.  Review of Systems:  Review of Systems   Constitutional:  Negative for chills and fever.  HENT:  Negative for congestion and sore throat.   Eyes:  Negative for double vision.  Respiratory:  Negative for cough, sputum production and shortness of breath.   Cardiovascular:  Positive for leg swelling. Negative for chest pain and palpitations.  Gastrointestinal:  Negative for abdominal pain, heartburn and nausea.  Genitourinary:  Negative for dysuria, frequency and hematuria.  Musculoskeletal:  Positive for back pain. Negative for falls and myalgias.  Neurological:  Negative for dizziness, sensory change and focal weakness.   Negative unless indicated in HPI.   Past Medical History:  Diagnosis Date   Benign prostatic hyperplasia with urinary obstruction    Bladder neck obstruction 02/02/2012   Colon polyps 09/01/2011   Depression    Diabetes mellitus without complication (HCC) 09/01/2011   Dyslipidemia associated with type 2 diabetes mellitus (HCC)    Edema    edema in lower legs   Hyperlipemia    Hypertension 09/01/2011   Kyphosis deformity of spine    Lung disease    Melanoma (HCC)    on back   Past Surgical History:  Procedure Laterality Date   HERNIA REPAIR     1960's   MEDIAL PARTIAL KNEE REPLACEMENT  2015   melanoma removed from back  2008   sub mucus  2015   Social History:   reports that he quit smoking about 58 years ago. His smoking use included cigarettes. He started smoking about 68 years ago. He has never used smokeless tobacco. He reports that he does not currently use alcohol. He reports  that he does not use drugs.  Family History  Problem Relation Age of Onset   Pancreatic cancer Father    Stomach cancer Sister    Hypertension Maternal Grandmother    Stroke Maternal Grandfather    Heart disease Paternal Grandmother     Medications: Patient's Medications  New Prescriptions   No medications on file  Previous Medications   ACCU-CHEK GUIDE TEST STRIP       ALBUTEROL (VENTOLIN HFA) 108 (90  BASE) MCG/ACT INHALER    Inhale 1 puff into the lungs as needed for wheezing or shortness of breath.   ASPIRIN 81 MG TABLET    Take 81 mg by mouth daily.   FINASTERIDE (PROSCAR) 5 MG TABLET    Take 5 mg by mouth daily.   FLUTICASONE (FLONASE) 50 MCG/ACT NASAL SPRAY    Place 1 spray into both nostrils daily.   FUROSEMIDE  (LASIX ) 40 MG TABLET    Take 1 tablet (40 mg total) by mouth daily.   JARDIANCE  10 MG TABS TABLET    TAKE 1 TABLET BY MOUTH EVERY DAY   LISINOPRIL  (ZESTRIL ) 5 MG TABLET    Take 1 tablet (5 mg total) by mouth daily.   LORATADINE  (CLARITIN ) 10 MG TABLET    Take 1 tablet (10 mg total) by mouth daily.   METFORMIN  (GLUCOPHAGE ) 500 MG TABLET    Take 1 tablet (500 mg total) by mouth 2 (two) times daily with a meal.   SILVER SULFADIAZINE (SILVADENE) 1 % CREAM    Apply 1 Application topically as needed. To legs for lesions   TRIAMCINOLONE  CREAM (KENALOG ) 0.1 %    APPLY TO AFFECTED AREA TWICE A DAY  Modified Medications   No medications on file  Discontinued Medications   No medications on file    Physical Exam: Vitals:   07/14/23 1304  BP: 135/78  Pulse: 81  Resp: 18  Temp: 97.7 F (36.5 C)  Weight: 229 lb 3.2 oz (104 kg)  Height: 6' (1.829 m)   Body mass index is 31.09 kg/m. BP Readings from Last 3 Encounters:  07/14/23 135/78  04/14/23 131/88  03/13/23 132/76   Wt Readings from Last 3 Encounters:  07/14/23 229 lb 3.2 oz (104 kg)  04/14/23 227 lb 9.6 oz (103.2 kg)  03/13/23 225 lb (102.1 kg)    Physical Exam Constitutional:      Appearance: Normal appearance.  HENT:     Head: Normocephalic and atraumatic.  Cardiovascular:     Rate and Rhythm: Normal rate and regular rhythm.     Pulses: Normal pulses.     Heart sounds: Normal heart sounds.  Pulmonary:     Effort: No respiratory distress.     Breath sounds: No stridor. No wheezing or rales.  Abdominal:     General: Bowel sounds are normal. There is no distension.     Palpations: Abdomen is soft.      Tenderness: There is no abdominal tenderness. There is no right CVA tenderness or guarding.  Musculoskeletal:        General: Swelling present.     Comments: Chronic swelling 1+ pitting odema Redness + , no new change as per patient and family  Neurological:     Mental Status: He is alert. Mental status is at baseline.     Labs reviewed: Basic Metabolic Panel: Recent Labs    12/09/22 1345 02/03/23 1406 02/11/23 1155  NA 140 142 139  K 4.6 5.5* 4.5  CL 100 101 98  CO2 31 33* 34*  GLUCOSE 134* 155* 218*  BUN 18 24 23   CREATININE 0.76 0.84 0.92  CALCIUM 9.9 10.2 9.9   Liver Function Tests: No results for input(s): "AST", "ALT", "ALKPHOS", "BILITOT", "PROT", "ALBUMIN" in the last 8760 hours. No results for input(s): "LIPASE", "AMYLASE" in the last 8760 hours. No results for input(s): "AMMONIA" in the last 8760 hours. CBC: Recent Labs    02/03/23 1406  WBC 8.3  NEUTROABS 6,258  HGB 16.4  HCT 48.9  MCV 98.8  PLT 214   Lipid Panel: No results for input(s): "CHOL", "HDL", "LDLCALC", "TRIG", "CHOLHDL", "LDLDIRECT" in the last 8760 hours. TSH: No results for input(s): "TSH" in the last 8760 hours. A1C: Lab Results  Component Value Date   HGBA1C 7.1 (H) 12/09/2022    Assessment and Plan Assessment & Plan   1. Essential hypertension (Primary) Bp at goal  Cont with the same  2. Type 2 diabetes mellitus with diabetic neuropathic arthropathy, without long-term current use of insulin (HCC) A1c at goal Cont with jardiance ,  - Lipid Panel - Hemoglobin A1C - Basic Metabolic Panel with eGFR  3. Chronic bilateral low back pain without sciatica  No red flag signs Take tylenol prn for pain    4. Lymphedema of both lower extremities Cont with lasix  Elevate feet  Use compression stockings  No follow-ups on file.:   40 min  Total time spent for obtaining history,  performing a medically appropriate examination and evaluation, reviewing the tests,   documenting  clinical information in the electronic or other health record, care coordination (not separately reported)

## 2023-07-21 ENCOUNTER — Other Ambulatory Visit

## 2023-07-22 ENCOUNTER — Other Ambulatory Visit: Payer: Self-pay | Admitting: Sports Medicine

## 2023-07-22 LAB — BASIC METABOLIC PANEL WITHOUT GFR
BUN: 19 mg/dL (ref 7–25)
CO2: 38 mmol/L — ABNORMAL HIGH (ref 20–32)
Calcium: 9.8 mg/dL (ref 8.6–10.3)
Chloride: 99 mmol/L (ref 98–110)
Creat: 0.83 mg/dL (ref 0.70–1.22)
Glucose, Bld: 158 mg/dL — ABNORMAL HIGH (ref 65–99)
Potassium: 4.9 mmol/L (ref 3.5–5.3)
Sodium: 141 mmol/L (ref 135–146)

## 2023-07-22 LAB — HEMOGLOBIN A1C
Hgb A1c MFr Bld: 7.7 % — ABNORMAL HIGH (ref <5.7)
Mean Plasma Glucose: 174 mg/dL
eAG (mmol/L): 9.7 mmol/L

## 2023-07-22 LAB — LIPID PANEL
Cholesterol: 235 mg/dL — ABNORMAL HIGH (ref <200)
HDL: 66 mg/dL (ref >=40)
LDL Cholesterol (Calc): 135 mg/dL — ABNORMAL HIGH
Non-HDL Cholesterol (Calc): 169 mg/dL — ABNORMAL HIGH (ref <130)
Total CHOL/HDL Ratio: 3.6 (calc) (ref <5.0)
Triglycerides: 198 mg/dL — ABNORMAL HIGH (ref <150)

## 2023-07-22 MED ORDER — ATORVASTATIN CALCIUM 10 MG PO TABS: 10.0000 mg | ORAL_TABLET | Freq: Every day | ORAL | 1 refills | Status: DC

## 2023-09-29 LAB — HM DIABETES EYE EXAM

## 2023-10-21 ENCOUNTER — Ambulatory Visit: Admitting: Sports Medicine

## 2023-11-17 ENCOUNTER — Encounter: Payer: Self-pay | Admitting: Family Medicine

## 2023-11-17 ENCOUNTER — Ambulatory Visit (INDEPENDENT_AMBULATORY_CARE_PROVIDER_SITE_OTHER): Admitting: Family Medicine

## 2023-11-17 VITALS — BP 138/40 | HR 76 | Temp 97.7°F | Resp 13 | Ht 72.0 in | Wt 228.4 lb

## 2023-11-17 DIAGNOSIS — M7989 Other specified soft tissue disorders: Secondary | ICD-10-CM

## 2023-11-17 DIAGNOSIS — I890 Lymphedema, not elsewhere classified: Secondary | ICD-10-CM

## 2023-11-17 DIAGNOSIS — M79604 Pain in right leg: Secondary | ICD-10-CM | POA: Diagnosis not present

## 2023-11-17 DIAGNOSIS — E11610 Type 2 diabetes mellitus with diabetic neuropathic arthropathy: Secondary | ICD-10-CM | POA: Diagnosis not present

## 2023-11-17 NOTE — Patient Instructions (Signed)
 Double dose of Lasix  for three days Continue elevation and support hose If not improved add metolazone

## 2023-11-17 NOTE — Progress Notes (Signed)
  Subjective:     Patient ID: Brian [name] Claudene Mickey., male   DOB: 1931-12-15, 89 y.o.   MRN: [id number]  HPI is here today for routine follow-up blood pressure, diabetes, he feels the edema is worse than it has been, having seen him previously.  He is to elevate legs use support stockings but is walking very little we did encourage.   Review of Systems  Constitutional: Negative.   HENT: Negative.    Eyes: Negative.   Respiratory: Negative.    Cardiovascular:  Positive for leg swelling.  Gastrointestinal: Negative.   Skin:  Positive for rash.       Objective:   Physical Exam Vitals and nursing note reviewed.  Constitutional:      Appearance: He is obese.  Cardiovascular:     Rate and Rhythm: Normal rate and regular rhythm.  Pulmonary:     Effort: Pulmonary effort is normal.     Breath sounds: Normal breath sounds.  Skin:    Findings: Erythema present.     Comments: Bil edema R>>L with stasis dermatitis No wounds  Neurological:     General: No focal deficit present.     Mental Status: He is alert and oriented to person, place, and time.  Psychiatric:        Mood and Affect: Mood normal.        Behavior: Behavior normal.        Thought Content: Thought content normal.        Assessment:     1. Type 2 diabetes mellitus with diabetic neuropathic arthropathy, without long-term current use of insulin (HCC) Last A1c was 7.7 on Jardiance  10 mg  2. Pain and swelling of right lower extremity Chronic problem it seems to be worse recently.  We discussed use of Lasix .  He is comfortable increasing dose for 3 days while continuing elevation and walking more.  If this does not help may want to add additional diuretic in the form of metolazone 2-3 times a week       Plan:     Increase Lasix  to 80 mg x 3 days Continue to elevate use support stockings and walk more

## 2023-11-19 ENCOUNTER — Other Ambulatory Visit: Payer: Self-pay | Admitting: Sports Medicine

## 2023-11-30 ENCOUNTER — Ambulatory Visit: Payer: Self-pay

## 2023-11-30 NOTE — Telephone Encounter (Signed)
 Appointment scheduled for 12/02/2023 for Evaluation.

## 2023-11-30 NOTE — Telephone Encounter (Signed)
 FYI Only or Action Required?: Action required by provider: Requesting advice about lasix  medication.  Patient was last seen in primary care on 11/17/2023 by Cleotilde Garnette HERO, MD.  Called Nurse Triage reporting Wound Infection.  Symptoms began several weeks ago.  Interventions attempted: Rest, hydration, or home remedies.  Symptoms are: gradually worsening.  Triage Disposition: See Physician Within 24 Hours  Patient/caregiver understands and will follow disposition?: Yes             Copied from CRM [id number]. Topic: Clinical - Red Word Triage >> Nov 30, 2023  1:07 PM Suzette B wrote: [facility] that prompted transfer to Nurse Triage: left leg lower has a blister, from knee to ankle has been swelling and the more it swells it drains very concerned because its spreading Reason for Disposition  [1] Pus or cloudy fluid draining from wound AND [2] no fever  Answer Assessment - Initial Assessment Questions Spoke with Randine Gearing, caregiver of the patient. She states she wants to prevent area being infected again, blister started 2 weeks ago. Swelling in area with fluid leaking. Requesting information about if he can take his fluid pill twice per day to help with Swelling. Requesting a call back at [phone].   1. LOCATION: Where is the wound located?      L lower leg from knee to ankle  2. WOUND APPEARANCE: What does the wound look like?      Redness with swelling.  3. SIZE: If redness is present, ask: What is the size of the red area? (Inches, centimeters, or compare to size of a coin)      Redness on L lower leg area  4. ONSET: When did it start to look infected?      2 weeks ago  5. MECHANISM: How did the wound start, what was the cause?     Started off as a small pimple.  6. PAIN: Do you have any pain?  If Yes, ask: How bad is the pain?  (e.g., Scale 1-10; mild, moderate, or severe)     Denies pain.  7. FEVER: Do you have a fever? If Yes, ask: What is your  temperature, how was it measured, and when did it start?     Denies  8. OTHER SYMPTOMS: Do you have any other symptoms? (e.g., shaking chills, weakness, rash elsewhere on body)     Denies chills, and weakness. Has redness on area.  Protocols used: Wound Infection Suspected-A-AH

## 2023-12-02 ENCOUNTER — Encounter: Payer: Self-pay | Admitting: Sports Medicine

## 2023-12-02 ENCOUNTER — Ambulatory Visit (INDEPENDENT_AMBULATORY_CARE_PROVIDER_SITE_OTHER): Admitting: Sports Medicine

## 2023-12-02 VITALS — BP 116/68 | HR 88 | Temp 98.0°F | Ht 72.0 in

## 2023-12-02 DIAGNOSIS — M7989 Other specified soft tissue disorders: Secondary | ICD-10-CM | POA: Diagnosis not present

## 2023-12-02 DIAGNOSIS — L03115 Cellulitis of right lower limb: Secondary | ICD-10-CM

## 2023-12-02 DIAGNOSIS — I10 Essential (primary) hypertension: Secondary | ICD-10-CM

## 2023-12-02 DIAGNOSIS — Z23 Encounter for immunization: Secondary | ICD-10-CM

## 2023-12-02 DIAGNOSIS — E11610 Type 2 diabetes mellitus with diabetic neuropathic arthropathy: Secondary | ICD-10-CM

## 2023-12-02 MED ORDER — AMOXICILLIN-POT CLAVULANATE 500-125 MG PO TABS: 1.0000 | ORAL_TABLET | Freq: Two times a day (BID) | ORAL | 0 refills | Status: DC

## 2023-12-02 NOTE — Progress Notes (Signed)
 U/s  Careteam: Patient Care Team: Sherlynn Madden, MD as PCP - General (Internal Medicine) Nicholaus Tanda LITTIE DOUGLAS, MD as Attending Physician (Urology) Geraldene Loving, MD (Ophthalmology) Jacques Lenis, MD as Referring Physician  PLACE OF SERVICE:  [facility]  Advanced Directive information    Allergies  Allergen Reactions   Lopressor [Metoprolol Tartrate]     Mental status changes (intolerance)    Mixed Ragweed    Beta Adrenergic Blockers     they make me depressed    Chief Complaint  Patient presents with   Acute Visit    Swelling in leg // caregiver was concerned about it and advised that it should be looked at right leg.      Discussed the use of AI scribe software for clinical note transcription with the patient, who gave verbal consent to proceed.  History of Present Illness Brian [name]. is an 89 year old male with chronic leg swelling who presents with a new open wound on his leg.  Approximately one week ago, he developed a new open wound on his leg, originating from a blister. The wound is located on his leg, which is also red and swollen. The redness has been persistent without change in extent. The wound is not oozing, and there is no associated fever, chills, or pain. He can feel the area but does not experience numbness or pain in the leg or calf.  The swelling in his leg decreases overnight when elevated on a pillow but returns during the day, a pattern consistent for several years. The other leg is slightly red and swollen but not as severely. He uses a large Band-Aid to cover the wound, and his caregiver applies an unspecified antibacterial cream.  He takes Lasix  40 mg daily to manage his leg swelling, adjusting the timing based on activities to avoid frequent bathroom trips. Occasionally, he takes two pills for three days if needed, but currently, the swelling is stable and unchanged.  His caregiver is concerned about his diet, particularly high  sodium foods like ham and pork chops, which could contribute to fluid retention. He tries to keep his leg elevated as much as possible.  He monitors his blood sugar levels, which have fluctuated but are currently stable. No fever, chills, pain in the leg or calf, changes in breathing, or abdominal pain.     Review of Systems:  Review of Systems  Constitutional:  Negative for chills and fever.  HENT:  Negative for congestion and sore throat.   Respiratory:  Negative for cough, sputum production and shortness of breath.   Cardiovascular:  Positive for leg swelling. Negative for chest pain and palpitations.  Gastrointestinal:  Negative for abdominal pain, heartburn and nausea.  Genitourinary:  Negative for dysuria, frequency and hematuria.  Musculoskeletal:  Negative for falls and myalgias.  Neurological:  Negative for dizziness.   Negative unless indicated in HPI.   Past Medical History:  Diagnosis Date   Benign prostatic hyperplasia with urinary obstruction    Bladder neck obstruction 02/02/2012   Colon polyps 09/01/2011   Depression    Diabetes mellitus without complication (HCC) 09/01/2011   Dyslipidemia associated with type 2 diabetes mellitus (HCC)    Edema    edema in lower legs   Hyperlipemia    Hypertension 09/01/2011   Kyphosis deformity of spine    Lung disease    Melanoma (HCC)    on back   Past Surgical History:  Procedure Laterality Date   HERNIA REPAIR  1960's   MEDIAL PARTIAL KNEE REPLACEMENT  2015   melanoma removed from back  2008   sub mucus  2015   Social History:   reports that he quit smoking about 58 years ago. His smoking use included cigarettes. He started smoking about 68 years ago. He has never used smokeless tobacco. He reports that he does not currently use alcohol. He reports that he does not use drugs.  Family History  Problem Relation Age of Onset   Pancreatic cancer Father    Stomach cancer Sister    Hypertension Maternal Grandmother     Stroke Maternal Grandfather    Heart disease Paternal Grandmother     Medications: Patient's Medications  New Prescriptions   No medications on file  Previous Medications   ACCU-CHEK GUIDE TEST STRIP       ALBUTEROL (VENTOLIN HFA) 108 (90 BASE) MCG/ACT INHALER    Inhale 1 puff into the lungs as needed for wheezing or shortness of breath.   ASPIRIN 81 MG TABLET    Take 81 mg by mouth daily.   ATORVASTATIN  (LIPITOR) 10 MG TABLET    Take 1 tablet (10 mg total) by mouth daily.   FINASTERIDE (PROSCAR) 5 MG TABLET    Take 5 mg by mouth daily.   FLUTICASONE (FLONASE) 50 MCG/ACT NASAL SPRAY    Place 1 spray into both nostrils daily.   FUROSEMIDE  (LASIX ) 40 MG TABLET    Take 1 tablet (40 mg total) by mouth daily.   JARDIANCE  10 MG TABS TABLET    TAKE 1 TABLET BY MOUTH EVERY DAY   LISINOPRIL  (ZESTRIL ) 5 MG TABLET    TAKE 1 TABLET (5 MG TOTAL) BY MOUTH DAILY.   LORATADINE  (CLARITIN ) 10 MG TABLET    Take 1 tablet (10 mg total) by mouth daily.   METFORMIN  (GLUCOPHAGE ) 500 MG TABLET    Take 1 tablet (500 mg total) by mouth 2 (two) times daily with a meal.   SILVER SULFADIAZINE (SILVADENE) 1 % CREAM    Apply 1 Application topically as needed. To legs for lesions   TRIAMCINOLONE  CREAM (KENALOG ) 0.1 %    APPLY TO AFFECTED AREA TWICE A DAY  Modified Medications   No medications on file  Discontinued Medications   No medications on file    Physical Exam: Vitals:   12/02/23 1351  BP: 116/68  Pulse: 88  Temp: 98 F (36.7 C)  SpO2: 90%  Height: 6' (1.829 m)   Body mass index is 30.98 kg/m. BP Readings from Last 3 Encounters:  12/02/23 116/68  11/17/23 (!) 138/40  07/14/23 135/78   Wt Readings from Last 3 Encounters:  11/17/23 228 lb 6.4 oz (103.6 kg)  07/14/23 229 lb 3.2 oz (104 kg)  04/14/23 227 lb 9.6 oz (103.2 kg)    Physical Exam Constitutional:      Appearance: Normal appearance.  HENT:     Head: Normocephalic and atraumatic.  Cardiovascular:     Rate and Rhythm: Normal  rate and regular rhythm.     Pulses: Normal pulses.     Heart sounds: Normal heart sounds.  Pulmonary:     Effort: No respiratory distress.     Breath sounds: No stridor. No wheezing or rales.  Abdominal:     General: Bowel sounds are normal. There is no distension.     Palpations: Abdomen is soft.     Tenderness: There is no abdominal tenderness. There is no guarding.  Musculoskeletal:     Comments: Rt  leg erythematous, swollen 2x2 cm hallow wound on anteriorly [place] + No tenderness on palpation  Slight warmth +  Neurological:     Mental Status: He is alert. Mental status is at baseline.     Motor: No weakness.     Labs reviewed: Basic Metabolic Panel: Recent Labs    02/03/23 1406 02/11/23 1155 07/21/23 0854  NA 142 139 141  K 5.5* 4.5 4.9  CL 101 98 99  CO2 33* 34* 38*  GLUCOSE 155* 218* 158*  BUN 24 23 19   CREATININE 0.84 0.92 0.83  CALCIUM  10.2 9.9 9.8   Liver Function Tests: No results for input(s): AST, ALT, ALKPHOS, BILITOT, PROT, ALBUMIN in the last 8760 hours. No results for input(s): LIPASE, AMYLASE in the last 8760 hours. No results for input(s): AMMONIA in the last 8760 hours. CBC: Recent Labs    02/03/23 1406  WBC 8.3  NEUTROABS 6,258  HGB 16.4  HCT 48.9  MCV 98.8  PLT 214   Lipid Panel: Recent Labs    07/21/23 0854  CHOL 235*  HDL 66  LDLCALC 135*  TRIG 198*  CHOLHDL 3.6   TSH: No results for input(s): TSH in the last 8760 hours. A1C: Lab Results  Component Value Date   HGBA1C 7.7 (H) 07/21/2023    Assessment and Plan Assessment & Plan   1. Cellulitis of right lower extremity (Primary)  Pt noted with increased redness, swelling and open wound since last week Will get labs Will order u/s to r/o DVT  Will start Augmentin  500mg  bid Clean the area daily and apply bacitracin and non adherent dressing  - CBC with Differential/Platelets - Basic Metabolic Panel - US  Venous Img Lower Unilateral Right;  Future - amoxicillin -clavulanate (AUGMENTIN ) 500-125 MG tablet; Take 1 tablet by mouth in the morning and at bedtime.  Dispense: 10 tablet; Refill: 0  2. Essential hypertension  At goal  Cont with the same  3. Type 2 diabetes mellitus with diabetic neuropathic arthropathy, without long-term current use of insulin (HCC)  Will check a1c - Hemoglobin A1c    Swelling of lower extremity Instructed patient to avoid salty foods Elevate feet  Cont with lasix   - Brain Natriuretic Peptide - US  Venous Img Lower Unilateral Right; Future

## 2023-12-03 ENCOUNTER — Ambulatory Visit: Payer: Self-pay | Admitting: Sports Medicine

## 2023-12-03 ENCOUNTER — Other Ambulatory Visit: Payer: Self-pay | Admitting: Sports Medicine

## 2023-12-03 ENCOUNTER — Ambulatory Visit
Admission: RE | Admit: 2023-12-03 | Discharge: 2023-12-03 | Disposition: A | Discharge status: Home / Self Care | Source: Ambulatory Visit | Attending: Sports Medicine | Admitting: Sports Medicine

## 2023-12-03 DIAGNOSIS — M7989 Other specified soft tissue disorders: Secondary | ICD-10-CM

## 2023-12-03 DIAGNOSIS — L03115 Cellulitis of right lower limb: Secondary | ICD-10-CM

## 2023-12-03 LAB — CBC WITH DIFFERENTIAL/PLATELET
Absolute Lymphocytes: 1160 {cells}/uL (ref 850–3900)
Absolute Monocytes: 728 {cells}/uL (ref 200–950)
Basophils Absolute: 40 {cells}/uL (ref 0–200)
Basophils Relative: 0.5 %
Eosinophils Absolute: 24 {cells}/uL (ref 15–500)
Eosinophils Relative: 0.3 %
HCT: 49.8 % (ref 38.5–50.0)
Hemoglobin: 16.4 g/dL (ref 13.2–17.1)
MCH: 32.5 pg (ref 27.0–33.0)
MCHC: 32.9 g/dL (ref 32.0–36.0)
MCV: 98.8 fL (ref 80.0–100.0)
MPV: 10.6 fL (ref 7.5–12.5)
Monocytes Relative: 9.1 %
Neutro Abs: 6048 {cells}/uL (ref 1500–7800)
Neutrophils Relative %: 75.6 %
Platelets: 211 Thousand/uL (ref 140–400)
RBC: 5.04 Million/uL (ref 4.20–5.80)
RDW: 12.5 % (ref 11.0–15.0)
Total Lymphocyte: 14.5 %
WBC: 8 Thousand/uL (ref 3.8–10.8)

## 2023-12-03 LAB — BASIC METABOLIC PANEL WITH GFR
BUN: 24 mg/dL (ref 7–25)
CO2: 35 mmol/L — ABNORMAL HIGH (ref 20–32)
Calcium: 9.8 mg/dL (ref 8.6–10.3)
Chloride: 98 mmol/L (ref 98–110)
Creat: 0.9 mg/dL (ref 0.70–1.22)
Glucose, Bld: 200 mg/dL — ABNORMAL HIGH (ref 65–99)
Potassium: 4.4 mmol/L (ref 3.5–5.3)
Sodium: 138 mmol/L (ref 135–146)
eGFR: 82 mL/min/1.73m2 (ref >=60)

## 2023-12-03 LAB — HEMOGLOBIN A1C
Hgb A1c MFr Bld: 8.1 % — ABNORMAL HIGH (ref <5.7)
Mean Plasma Glucose: 186 mg/dL
eAG (mmol/L): 10.3 mmol/L

## 2023-12-03 LAB — BRAIN NATRIURETIC PEPTIDE: Brain Natriuretic Peptide: 12 pg/mL (ref <100)

## 2023-12-18 ENCOUNTER — Encounter: Payer: Self-pay | Admitting: Family

## 2023-12-18 ENCOUNTER — Ambulatory Visit: Payer: Medicare (Managed Care) | Admitting: Family

## 2023-12-18 VITALS — BP 114/66 | HR 87 | Temp 97.6°F | Resp 18 | Ht 72.0 in | Wt 226.8 lb

## 2023-12-18 DIAGNOSIS — Z0000 Encounter for general adult medical examination without abnormal findings: Secondary | ICD-10-CM | POA: Diagnosis not present

## 2023-12-18 DIAGNOSIS — H9190 Unspecified hearing loss, unspecified ear: Secondary | ICD-10-CM | POA: Diagnosis not present

## 2023-12-18 NOTE — Progress Notes (Signed)
 Subjective:   Brian [name]. is a 89 y.o. male who presents for Medicare Annual/Subsequent preventive examination.  Visit Complete: In person  Patient Medicare AWV questionnaire was completed by the patient on 12/18/2023; I have confirmed that all information answered by patient is correct and no changes since this date.  Cardiac Risk Factors include: advanced age (>48men, >38 women);hypertension;dyslipidemia;male gender;obesity (BMI >30kg/m2)     Objective:    Today's Vitals   12/18/23 1121 12/18/23 1125  BP: 114/66   Pulse: 87   Resp: 18   Temp: 97.6 F (36.4 C)   SpO2: 97%   Weight: 226 lb 12.8 oz (102.9 kg)   Height: 6' (1.829 m)   PainSc:  1    Body mass index is 30.76 kg/m.     12/18/2023   11:11 AM 03/13/2023   10:28 AM 02/11/2023   11:18 AM 02/03/2023    1:06 PM 12/16/2022   10:46 AM 12/09/2022   12:51 PM 05/14/2022   12:39 PM  Advanced Directives  Does Patient Have a Medical Advance Directive? Yes No Yes Yes Yes Yes Yes  Type of Forensic scientist of [place];Living will Healthcare Power of [place];Living will Healthcare Power of [place];Living will Healthcare Power of [employer] of [place];Living will  Does patient want to make changes to medical advance directive? No - Patient declined  No - Patient declined No - Patient declined Yes (Inpatient - patient defers changing a medical advance directive at this time - Information given) No - Patient declined No - Patient declined  Copy of Healthcare Power of Attorney in Chart? Yes - validated most recent copy scanned in chart (See row information)  Yes - validated most recent copy scanned in chart (See row information) Yes - validated most recent copy scanned in chart (See row information) Yes - validated most recent copy scanned in chart (See row information) Yes - validated most recent copy scanned in chart (See row information) Yes - validated most  recent copy scanned in chart (See row information)  Would patient like information on creating a medical advance directive?  No - Patient declined         Current Medications (verified) Outpatient Encounter Medications as of 12/18/2023  Medication Sig   ACCU-CHEK GUIDE test strip    albuterol (VENTOLIN HFA) 108 (90 Base) MCG/ACT inhaler Inhale 1 puff into the lungs as needed for wheezing or shortness of breath.   amoxicillin -clavulanate (AUGMENTIN ) 500-125 MG tablet Take 1 tablet by mouth in the morning and at bedtime.   aspirin 81 MG tablet Take 81 mg by mouth daily.   atorvastatin  (LIPITOR) 10 MG tablet Take 1 tablet (10 mg total) by mouth daily.   finasteride (PROSCAR) 5 MG tablet Take 5 mg by mouth daily.   fluticasone (FLONASE) 50 MCG/ACT nasal spray Place 1 spray into both nostrils daily. (Patient taking differently: Place 1 spray into both nostrils as needed.)   furosemide  (LASIX ) 40 MG tablet Take 1 tablet (40 mg total) by mouth daily.   JARDIANCE  10 MG TABS tablet TAKE 1 TABLET BY MOUTH EVERY DAY   lisinopril  (ZESTRIL ) 5 MG tablet TAKE 1 TABLET (5 MG TOTAL) BY MOUTH DAILY.   loratadine  (CLARITIN ) 10 MG tablet Take 1 tablet (10 mg total) by mouth daily.   metFORMIN  (GLUCOPHAGE ) 500 MG tablet Take 1 tablet (500 mg total) by mouth 2 (two) times daily with a meal.   silver sulfADIAZINE (SILVADENE) 1 %  cream Apply 1 Application topically as needed. To legs for lesions (Patient taking differently: Apply 1 Application topically daily. To legs for lesions)   triamcinolone  cream (KENALOG ) 0.1 % APPLY TO AFFECTED AREA TWICE A DAY   Facility-Administered Encounter Medications as of 12/18/2023  Medication   mupirocin  ointment (BACTROBAN ) 2 %    Allergies (verified) Lopressor [metoprolol tartrate], Mixed ragweed, and Beta adrenergic blockers   History: Past Medical History:  Diagnosis Date   Benign prostatic hyperplasia with urinary obstruction    Bladder neck obstruction 02/02/2012    Colon polyps 09/01/2011   Depression    Diabetes mellitus without complication (HCC) 09/01/2011   Dyslipidemia associated with type 2 diabetes mellitus (HCC)    Edema    edema in lower legs   Hyperlipemia    Hypertension 09/01/2011   Kyphosis deformity of spine    Lung disease    Melanoma (HCC)    on back   Past Surgical History:  Procedure Laterality Date   HERNIA REPAIR     1960's   MEDIAL PARTIAL KNEE REPLACEMENT  2015   melanoma removed from back  2008   sub mucus  2015   Family History  Problem Relation Age of Onset   Pancreatic cancer Father    Stomach cancer Sister    Hypertension Maternal Grandmother    Stroke Maternal Grandfather    Heart disease Paternal Grandmother    Social History   Socioeconomic History   Marital status: Widowed    Spouse name: [place]    Number of children: 1   Years of education: 7   Highest education level: Doctorate  Occupational History   Occupation: Retired Location manager   Tobacco Use   Smoking status: Former    Current packs/day: 0.00    Types: Cigarettes    Start date: 1957    Quit date: 1967    Years since quitting: 58.7   Smokeless tobacco: Never  Vaping Use   Vaping status: Never Used  Substance and Sexual Activity   Alcohol use: Not Currently   Drug use: Never   Sexual activity: Not Currently  Other Topics Concern   Not on file  Social History Narrative   Social History      Diet? Low sodium       Do you drink/eat things with caffeine? No       Marital status?                            Widowed         What year were you married? 1958      Do you live in a house, apartment, assisted living, condo, trailer, etc.? house      Is it one or more stories? One       How many persons live in your home? One       Do you have any pets in your home? (please list) no       Highest level of education completed? Graduate degree divinity [employer]       Current or past profession: [employer]       Do you  exercise?               No                        Type & how often?      Advanced Directives  Do you have a living will? yes      Do you have a DNR form?                                  If not, do you want to discuss one? No       Do you have signed POA/HPOA for forms? No       Functional Status      Do you have difficulty bathing or dressing yourself? Yes       Do you have difficulty preparing food or eating? Yes       Do you have difficulty managing your medications? No       Do you have difficulty managing your finances? No       Do you have difficulty affording your medications?no       Social Drivers of Health   Financial Resource Strain: Low Risk  (04/06/2023)   Received from [facility]   Overall Financial Resource Strain (CARDIA)    Difficulty of Paying Living Expenses: Not hard at all  Food Insecurity: No Food Insecurity (12/18/2023)   Hunger Vital Sign    Worried About Running Out of Food in the Last Year: Never true    Ran Out of Food in the Last Year: Never true  Transportation Needs: No Transportation Needs (12/18/2023)   PRAPARE - Administrator, Civil Service (Medical): No    Lack of Transportation (Non-Medical): No  Physical Activity: Not on file  Stress: Not on file  Social Connections: Not on file    Tobacco Counseling Counseling given: Not Answered   Clinical Intake:  Pre-visit preparation completed: No  Pain : 0-10 Pain Score: 1  Pain Type: Chronic pain Pain Location: Leg Pain Orientation: Right Pain Radiating Towards: No Pain Descriptors / Indicators: Other (Comment) (stingy) Pain Onset: More than a month ago Pain Frequency: Intermittent Pain Relieving Factors: Moving around Effect of Pain on Daily Activities: No  Pain Relieving Factors: Moving around  BMI - recorded: 30.76 Nutritional Status: BMI > 30  Obese Nutritional Risks: None Diabetes: Yes CBG done?: No Did pt. bring in CBG monitor from home?: No  (120's-130's)  How often do you need to have someone help you when you read instructions, pamphlets, or other written materials from your doctor or pharmacy?: 1 - Never What is the last grade level you completed in school?: Master's of Theology  Interpreter Needed?: No      Activities of Daily Living    12/18/2023   11:39 AM  In your present state of health, do you have any difficulty performing the following activities:  Hearing? 1  Vision? 0  Difficulty concentrating or making decisions? 1  Comment Remembering  Walking or climbing stairs? 1  Comment uses a walker  Dressing or bathing? 0  Doing errands, shopping? 1  Comment needs assist with driving  Preparing Food and eating ? Y  Comment sister in law cooks  Using the Toilet? N  In the past six months, have you accidently leaked urine? N  Do you have problems with loss of bowel control? N  Managing your Medications? N  Managing your Finances? Y  Comment Brother in Primary school teacher or managing your Housekeeping? Y  Comment Has Programmer, applications    Patient Care Team: Sherlynn Madden, MD as PCP - General (Internal Medicine) Nicholaus Tanda LITTIE DOUGLAS, MD as Attending Physician (  Urology) Geraldene Loving, MD (Ophthalmology) Jacques Lenis, MD as Referring Physician  Indicate any recent Medical Services you may have received from other than [facility] providers in the past year (date may be approximate).     Assessment:   This is a routine wellness examination for [place].  Hearing/Vision screen Hearing Screening - Comments:: Issues with hearing. Vision Screening - Comments:: Eye exam 3 month ago no issues ( Dr[facility])   Goals Addressed             This Visit's Progress    Patient Stated   On track    Work on exercise to gain stability on gait        Depression Screen    12/18/2023   11:13 AM 12/02/2023    1:52 PM 07/14/2023    1:25 PM 07/14/2023    1:04 PM 04/14/2023    1:34 PM  04/14/2023    1:19 PM 12/16/2022   10:46 AM  PHQ 2/9 Scores  PHQ - 2 Score 1 0 0 0 0 0 0    Fall Risk    12/18/2023   11:13 AM 12/02/2023    1:52 PM 07/14/2023    1:25 PM 07/14/2023    1:03 PM 04/14/2023    1:34 PM  Fall Risk   Falls in the past year? 0 0 0 0 0  Number falls in past yr: 0 0 0 0 0  Injury with Fall? 0 0 1 0 1  Risk for fall due to : No Fall Risks No Fall Risks  No Fall Risks   Follow up Falls evaluation completed Falls evaluation completed  Falls evaluation completed     MEDICARE RISK AT HOME: Medicare Risk at Home Any stairs in or around the home?: No If so, are there any without handrails?: No Home free of loose throw rugs in walkways, pet beds, electrical cords, etc?: No Adequate lighting in your home to reduce risk of falls?: Yes Life alert?: Yes Use of a cane, walker or w/c?: Yes Grab bars in the bathroom?: Yes Shower chair or bench in shower?: Yes Elevated toilet seat or a handicapped toilet?: No  TIMED UP AND GO:  Was the test performed?  Yes  Length of time to ambulate 10 feet: 15 sec Gait slow and steady with assistive device    Cognitive Function:    12/16/2022   10:49 AM  MMSE - Mini Mental State Exam  Orientation to time 5  Orientation to Place 5  Registration 3  Attention/ Calculation 5  Recall 2  Language- name 2 objects 2  Language- repeat 1  Language- follow 3 step command 3  Language- read & follow direction 1  Write a sentence 1  Copy design 1  Total score 29        12/18/2023   11:14 AM 12/06/2021   10:32 AM 12/04/2020    3:50 PM  6CIT Screen  What Year? 0 points 0 points 0 points  What month? 0 points 0 points 0 points  What time? 0 points 0 points 0 points  Count back from 20 0 points 0 points 0 points  Months in reverse 0 points 0 points 0 points  Repeat phrase 0 points 0 points 0 points  Total Score 0 points 0 points 0 points    Immunizations Immunization History  Administered Date(s) Administered   Fluad  Quad(high Dose 65+) 12/22/2019, 04/16/2022   Fluad Trivalent(High Dose 65+) 12/09/2022   Fluzone Influenza virus vaccine,trivalent (  IIV3), split virus 12/14/2009   H1N1 03/27/2008, 03/27/2008   INFLUENZA, HIGH DOSE SEASONAL PF 12/18/2014, 11/26/2016, 12/30/2017, 12/30/2017, 12/27/2018, 12/02/2023   Influenza, Quadrivalent, Recombinant, Inj, Pf 12/29/2012   Influenza,inj,quad, With Preservative 12/30/2017   Influenza,trivalent, recombinat, inj, PF 12/14/2009   Influenza-Unspecified 12/14/2009, 12/30/2017, 12/30/2017   Moderna Sars-Covid-2 Vaccination 05/07/2019, 06/04/2019, 01/30/2020, 12/24/2020   Pfizer(Comirnaty)Fall Seasonal Vaccine 12 years and older 12/18/2022   Pneumococcal Conjugate-13 04/20/2013   Pneumococcal Polysaccharide-23 10/27/2006   Tdap 02/08/2015, 02/08/2015, 02/17/2019    TDAP status: Up to date  Flu Vaccine status: Up to date  Pneumococcal vaccine status: Up to date  Covid-19 vaccine status: Information provided on how to obtain vaccines.   Qualifies for Shingles Vaccine? Yes   Zostavax completed No   Shingrix Completed?: No.    Education has been provided regarding the importance of this vaccine. Patient has been advised to call insurance company to determine out of pocket expense if they have not yet received this vaccine. Advised may also receive vaccine at local pharmacy or Health Dept. Verbalized acceptance and understanding.  Screening Tests Health Maintenance  Topic Date Due   FOOT EXAM  12/09/2023   COVID-19 Vaccine (6 - Moderna risk 2024-25 season) 03/18/2024 (Originally 11/23/2023)   Zoster Vaccines- Shingrix (1 of 2) 03/18/2024 (Originally 12/25/1950)   HEMOGLOBIN A1C  05/31/2024   OPHTHALMOLOGY EXAM  09/28/2024   Medicare Annual Wellness (AWV)  12/17/2024   DTaP/Tdap/Td (4 - Td or Tdap) 02/16/2029   Pneumococcal Vaccine: 50+ Years  Completed   Influenza Vaccine  Completed   HPV VACCINES  Aged Out   Meningococcal B Vaccine  Aged Out    Health  Maintenance  Health Maintenance Due  Topic Date Due   FOOT EXAM  12/09/2023    Colorectal cancer screening: No longer required.   Lung Cancer Screening: (Low Dose CT Chest recommended if Age 51-80 years, 20 pack-year currently smoking OR have quit w/in 15years.) does not qualify.   Lung Cancer Screening Referral: N/A   Additional Screening:  Hepatitis C Screening: does not qualify; Completed N/A   Vision Screening: Recommended annual ophthalmology exams for early detection of glaucoma and other disorders of the eye. Is the patient up to date with their annual eye exam?  Yes  Who is the provider or what is the name of the office in which the patient attends annual eye exams? Follow up with [facility]  If pt is not established with a provider, would they like to be referred to a provider to establish care? No .   Dental Screening: Recommended annual dental exams for proper oral hygiene  Diabetic Foot Exam: Diabetic Foot Exam: Completed follows up with Podiatrist   Community Resource Referral / Chronic Care Management: CRR required this visit?  No   CCM required this visit?  No     Plan:     I have personally reviewed and noted the following in the patient's chart:   Medical and social history Use of alcohol, tobacco or illicit drugs  Current medications and supplements including opioid prescriptions. Patient is not currently taking opioid prescriptions. Functional ability and status Nutritional status Physical activity Advanced directives List of other physicians Hospitalizations, surgeries, and ER visits in previous 12 months Vitals Screenings to include cognitive, depression, and falls Referrals and appointments  In addition, I have reviewed and discussed with patient certain preventive protocols, quality metrics, and best practice recommendations. A written personalized care plan for preventive services as well as general preventive health  recommendations were provided to patient.   Roxan JAYSON Plough, NP   12/18/2023   After Visit Summary: (In Person-Printed) AVS printed and given to the patient  Nurse Notes: Advised to get shingles and COVID-19 at the pharmacy

## 2023-12-18 NOTE — Patient Instructions (Signed)
 1.Go to local pharmacy to receive Covid vaccine.  Brian [name] , Thank you for taking time to come for your Medicare Wellness Visit. I appreciate your ongoing commitment to your health goals. Please review the following plan we discussed and let me know if I can assist you in the future.   Screening recommendations/referrals: Colonoscopy : N/A  Recommended yearly ophthalmology/optometry visit for glaucoma screening and checkup Recommended yearly dental visit for hygiene and checkup  Vaccinations: Influenza vaccine due annually in September/October Pneumococcal vaccine : Up to date  Tdap vaccine : Up to date  Shingles vaccine : Due please get vaccine at the pharmacy     Advanced directives: Yes   Conditions/risks identified: advanced age (>19men, >19 women);hypertension;dyslipidemia;male gender;obesity (BMI >30kg/m2)  Next appointment: 1 Year   Preventive Care 50 Years and Older, Male Preventive care refers to lifestyle choices and visits with your health care provider that can promote health and wellness. What does preventive care include? A yearly physical exam. This is also called an annual well check. Dental exams once or twice a year. Routine eye exams. Ask your health care provider how often you should have your eyes checked. Personal lifestyle choices, including: Daily care of your teeth and gums. Regular physical activity. Eating a healthy diet. Avoiding tobacco and drug use. Limiting alcohol use. Practicing safe sex. Taking low doses of aspirin every day. Taking vitamin and mineral supplements as recommended by your health care provider. What happens during an annual well check? The services and screenings done by your health care provider during your annual well check will depend on your age, overall health, lifestyle risk factors, and family history of disease. Counseling  Your health care provider may ask you questions about your: Alcohol use. Tobacco use. Drug  use. Emotional well-being. Home and relationship well-being. Sexual activity. Eating habits. History of falls. Memory and ability to understand (cognition). Work and work Astronomer. Screening  You may have the following tests or measurements: Height, weight, and BMI. Blood pressure. Lipid and cholesterol levels. These may be checked every 5 years, or more frequently if you are over 55 years old. Skin check. Lung cancer screening. You may have this screening every year starting at age 90 if you have a 30-pack-year history of smoking and currently smoke or have quit within the past 15 years. Fecal occult blood test (FOBT) of the stool. You may have this test every year starting at age 25. Flexible sigmoidoscopy or colonoscopy. You may have a sigmoidoscopy every 5 years or a colonoscopy every 10 years starting at age 63. Prostate cancer screening. Recommendations will vary depending on your family history and other risks. Hepatitis C blood test. Hepatitis B blood test. Sexually transmitted disease (STD) testing. Diabetes screening. This is done by checking your blood sugar (glucose) after you have not eaten for a while (fasting). You may have this done every 1-3 years. Abdominal aortic aneurysm (AAA) screening. You may need this if you are a current or former smoker. Osteoporosis. You may be screened starting at age 9 if you are at high risk. Talk with your health care provider about your test results, treatment options, and if necessary, the need for more tests. Vaccines  Your health care provider may recommend certain vaccines, such as: Influenza vaccine. This is recommended every year. Tetanus, diphtheria, and acellular pertussis (Tdap, Td) vaccine. You may need a Td booster every 10 years. Zoster vaccine. You may need this after age 41. Pneumococcal 13-valent conjugate (PCV13) vaccine. One dose  is recommended after age 61. Pneumococcal polysaccharide (PPSV23) vaccine. One dose is  recommended after age 73. Talk to your health care provider about which screenings and vaccines you need and how often you need them. This information is not intended to replace advice given to you by your health care provider. Make sure you discuss any questions you have with your health care provider. Document Released: 04/06/2015 Document Revised: 11/28/2015 Document Reviewed: 01/09/2015 Elsevier Interactive Patient Education  2017 [employer].  Fall Prevention in the Home Falls can cause injuries. They can happen to people of all ages. There are many things you can do to make your home safe and to help prevent falls. What can I do on the outside of my home? Regularly fix the edges of walkways and driveways and fix any cracks. Remove anything that might make you trip as you walk through a door, such as a raised step or threshold. Trim any bushes or trees on the path to your home. Use bright outdoor lighting. Clear any walking paths of anything that might make someone trip, such as rocks or tools. Regularly check to see if handrails are loose or broken. Make sure that both sides of any steps have handrails. Any raised decks and porches should have guardrails on the edges. Have any leaves, snow, or ice cleared regularly. Use sand or salt on walking paths during winter. Clean up any spills in your garage right away. This includes oil or grease spills. What can I do in the bathroom? Use night lights. Install grab bars by the toilet and in the tub and shower. Do not use towel bars as grab bars. Use non-skid mats or decals in the tub or shower. If you need to sit down in the shower, use a plastic, non-slip stool. Keep the floor dry. Clean up any water that spills on the floor as soon as it happens. Remove soap buildup in the tub or shower regularly. Attach bath mats securely with double-sided non-slip rug tape. Do not have throw rugs and other things on the floor that can make you  trip. What can I do in the bedroom? Use night lights. Make sure that you have a light by your bed that is easy to reach. Do not use any sheets or blankets that are too big for your bed. They should not hang down onto the floor. Have a firm chair that has side arms. You can use this for support while you get dressed. Do not have throw rugs and other things on the floor that can make you trip. What can I do in the kitchen? Clean up any spills right away. Avoid walking on wet floors. Keep items that you use a lot in easy-to-reach places. If you need to reach something above you, use a strong step stool that has a grab bar. Keep electrical cords out of the way. Do not use floor polish or wax that makes floors slippery. If you must use wax, use non-skid floor wax. Do not have throw rugs and other things on the floor that can make you trip. What can I do with my stairs? Do not leave any items on the stairs. Make sure that there are handrails on both sides of the stairs and use them. Fix handrails that are broken or loose. Make sure that handrails are as long as the stairways. Check any carpeting to make sure that it is firmly attached to the stairs. Fix any carpet that is loose or worn. Avoid  having throw rugs at the top or bottom of the stairs. If you do have throw rugs, attach them to the floor with carpet tape. Make sure that you have a light switch at the top of the stairs and the bottom of the stairs. If you do not have them, ask someone to add them for you. What else can I do to help prevent falls? Wear shoes that: Do not have high heels. Have rubber bottoms. Are comfortable and fit you well. Are closed at the toe. Do not wear sandals. If you use a stepladder: Make sure that it is fully opened. Do not climb a closed stepladder. Make sure that both sides of the stepladder are locked into place. Ask someone to hold it for you, if possible. Clearly mark and make sure that you can  see: Any grab bars or handrails. First and last steps. Where the edge of each step is. Use tools that help you move around (mobility aids) if they are needed. These include: Canes. Walkers. Scooters. Crutches. Turn on the lights when you go into a dark area. Replace any light bulbs as soon as they burn out. Set up your furniture so you have a clear path. Avoid moving your furniture around. If any of your floors are uneven, fix them. If there are any pets around you, be aware of where they are. Review your medicines with your doctor. Some medicines can make you feel dizzy. This can increase your chance of falling. Ask your doctor what other things that you can do to help prevent falls. This information is not intended to replace advice given to you by your health care provider. Make sure you discuss any questions you have with your health care provider. Document Released: 01/04/2009 Document Revised: 08/16/2015 Document Reviewed: 04/14/2014 Elsevier Interactive Patient Education  2017 [employer].

## 2024-01-26 ENCOUNTER — Ambulatory Visit: Admitting: Sports Medicine

## 2024-01-27 ENCOUNTER — Ambulatory Visit (INDEPENDENT_AMBULATORY_CARE_PROVIDER_SITE_OTHER): Admitting: Sports Medicine

## 2024-01-27 ENCOUNTER — Encounter: Payer: Self-pay | Admitting: Sports Medicine

## 2024-01-27 ENCOUNTER — Ambulatory Visit: Admitting: Sports Medicine

## 2024-01-27 VITALS — BP 123/69 | HR 86 | Temp 98.1°F | Ht 67.0 in | Wt 214.4 lb

## 2024-01-27 DIAGNOSIS — J302 Other seasonal allergic rhinitis: Secondary | ICD-10-CM | POA: Diagnosis not present

## 2024-01-27 DIAGNOSIS — I10 Essential (primary) hypertension: Secondary | ICD-10-CM

## 2024-01-27 DIAGNOSIS — R051 Acute cough: Secondary | ICD-10-CM

## 2024-01-27 DIAGNOSIS — E11610 Type 2 diabetes mellitus with diabetic neuropathic arthropathy: Secondary | ICD-10-CM

## 2024-01-27 DIAGNOSIS — Z7984 Long term (current) use of oral hypoglycemic drugs: Secondary | ICD-10-CM

## 2024-01-27 DIAGNOSIS — I890 Lymphedema, not elsewhere classified: Secondary | ICD-10-CM

## 2024-01-27 DIAGNOSIS — N401 Enlarged prostate with lower urinary tract symptoms: Secondary | ICD-10-CM

## 2024-01-27 DIAGNOSIS — R351 Nocturia: Secondary | ICD-10-CM

## 2024-01-27 MED ORDER — GUAIFENESIN ER 600 MG PO TB12
1200.0000 mg | ORAL_TABLET | Freq: Two times a day (BID) | ORAL | 0 refills | Status: AC
Start: 2024-01-27 — End: ?

## 2024-01-27 MED ORDER — FLUTICASONE PROPIONATE 50 MCG/ACT NA SUSP
1.0000 | Freq: Every day | NASAL | 1 refills | Status: AC
Start: 2024-01-27 — End: ?

## 2024-01-27 NOTE — Patient Instructions (Signed)
 Take mucinex  1tab twice daily   For allergies - you use flonase nasal spray daily and claritin  every day   Stop taking nyquil

## 2024-01-27 NOTE — Progress Notes (Signed)
 Careteam: Patient Care Team: Sherlynn Madden, MD as PCP - General (Internal Medicine) Nicholaus Tanda LITTIE DOUGLAS, MD as Attending Physician (Urology) Geraldene Loving, MD (Ophthalmology) Jacques Lenis, MD as Referring Physician  PLACE OF SERVICE:  [facility]  Advanced Directive information    Allergies  Allergen Reactions   Lopressor [Metoprolol Tartrate]     Mental status changes (intolerance)    Mixed Ragweed    Beta Adrenergic Blockers     they make me depressed    No chief complaint on file.    Discussed the use of AI scribe software for clinical note transcription with the patient, who gave verbal consent to proceed.  History of Present Illness    Brian [name]. is a 89 year old male with diabetes who presents with cough and congestion.  He has had a productive cough with clear sputum for the past two to three days, described as 'pretty moistures'. He checks his temperature three times a day and has not had a fever. There is no green or purulent sputum. He has not been outside recently, which he thinks might be related to allergies. No shortness of breath, although he mentions having to gasp a few times, which resolves quickly. No chest pain, dizziness, or lightheadedness. He has been using over-the-counter tablets and Hall's cough drops for relief.  He experiences nasal congestion and a sensation of post-nasal drip, especially when lying down. He has not been using any nasal spray or allergy medication. No sinus pain or facial tenderness.  Regarding his leg swelling, it 'comes and goes'. He sleeps with an elevated pillow, which seems to help. His right leg is always bigger than the left. He uses compression wraps daily unless there are wounds present and also uses a pump that helps with leg swelling. He takes Lasix  daily for swelling.  For his diabetes, he takes Jardiance  and checks his blood sugar about three times a week. His last A1c was 8.1. No episodes of  hypoglycemia, such as dizziness or sweating, and his blood sugar has not been under 100 in a long time. He had an eye exam within the last six months.  He lives alone but has caregivers who assist with daily activities. He is able to shower and change clothes independently. He arranges his medications in a pill box himself. He feels a bit down at times but denies any suicidal thoughts. He keeps busy with activities like working on old radios and antiques, although he is not as active as before. He walks around the house and uses a walker for support. No recent falls.    No significant memory issues, although he occasionally forgets names. No hallucinations or delusions. Repeat himself, independent with most of his ADLS.  Review of Systems:  Review of Systems  Constitutional:  Negative for chills and fever.  HENT:  Negative for congestion and sore throat.        Post nasal drip  Respiratory:  Positive for cough and sputum production. Negative for shortness of breath.   Cardiovascular:  Negative for chest pain, palpitations and leg swelling.  Gastrointestinal:  Negative for abdominal pain, heartburn and nausea.  Genitourinary:  Negative for dysuria, frequency and hematuria.  Musculoskeletal:  Negative for falls and myalgias.  Neurological:  Negative for dizziness.   Negative unless indicated in HPI.   Past Medical History:  Diagnosis Date   Benign prostatic hyperplasia with urinary obstruction    Bladder neck obstruction 02/02/2012   Colon polyps 09/01/2011  Depression    Diabetes mellitus without complication (HCC) 09/01/2011   Dyslipidemia associated with type 2 diabetes mellitus (HCC)    Edema    edema in lower legs   Hyperlipemia    Hypertension 09/01/2011   Kyphosis deformity of spine    Lung disease    Melanoma (HCC)    on back   Past Surgical History:  Procedure Laterality Date   HERNIA REPAIR     1960's   MEDIAL PARTIAL KNEE REPLACEMENT  2015   melanoma removed from  back  2008   sub mucus  2015   Social History:   reports that he quit smoking about 58 years ago. His smoking use included cigarettes. He started smoking about 68 years ago. He has never used smokeless tobacco. He reports that he does not currently use alcohol. He reports that he does not use drugs.  Family History  Problem Relation Age of Onset   Pancreatic cancer Father    Stomach cancer Sister    Hypertension Maternal Grandmother    Stroke Maternal Grandfather    Heart disease Paternal Grandmother     Medications: Patient's Medications  New Prescriptions   No medications on file  Previous Medications   ACCU-CHEK GUIDE TEST STRIP       ALBUTEROL (VENTOLIN HFA) 108 (90 BASE) MCG/ACT INHALER    Inhale 1 puff into the lungs as needed for wheezing or shortness of breath.   AMOXICILLIN -CLAVULANATE (AUGMENTIN ) 500-125 MG TABLET    Take 1 tablet by mouth in the morning and at bedtime.   ASPIRIN 81 MG TABLET    Take 81 mg by mouth daily.   ATORVASTATIN  (LIPITOR) 10 MG TABLET    Take 1 tablet (10 mg total) by mouth daily.   FINASTERIDE (PROSCAR) 5 MG TABLET    Take 5 mg by mouth daily.   FLUTICASONE (FLONASE) 50 MCG/ACT NASAL SPRAY    Place 1 spray into both nostrils daily.   FUROSEMIDE  (LASIX ) 40 MG TABLET    Take 1 tablet (40 mg total) by mouth daily.   JARDIANCE  10 MG TABS TABLET    TAKE 1 TABLET BY MOUTH EVERY DAY   LISINOPRIL  (ZESTRIL ) 5 MG TABLET    TAKE 1 TABLET (5 MG TOTAL) BY MOUTH DAILY.   LORATADINE  (CLARITIN ) 10 MG TABLET    Take 1 tablet (10 mg total) by mouth daily.   METFORMIN  (GLUCOPHAGE ) 500 MG TABLET    Take 1 tablet (500 mg total) by mouth 2 (two) times daily with a meal.   SILVER SULFADIAZINE (SILVADENE) 1 % CREAM    Apply 1 Application topically as needed. To legs for lesions   TRIAMCINOLONE  CREAM (KENALOG ) 0.1 %    APPLY TO AFFECTED AREA TWICE A DAY  Modified Medications   No medications on file  Discontinued Medications   No medications on file    Physical  Exam: There were no vitals filed for this visit. There is no height or weight on file to calculate BMI. BP Readings from Last 3 Encounters:  12/18/23 114/66  12/02/23 116/68  11/17/23 (!) 138/40   Wt Readings from Last 3 Encounters:  12/18/23 226 lb 12.8 oz (102.9 kg)  11/17/23 228 lb 6.4 oz (103.6 kg)  07/14/23 229 lb 3.2 oz (104 kg)    Physical Exam Constitutional:      Appearance: Normal appearance.  HENT:     Head: Normocephalic and atraumatic.  Cardiovascular:     Rate and Rhythm: Normal rate and regular  rhythm.     Pulses: Normal pulses.     Heart sounds: Normal heart sounds.  Pulmonary:     Effort: No respiratory distress.     Breath sounds: No stridor. No wheezing or rales.  Abdominal:     General: Bowel sounds are normal. There is no distension.     Palpations: Abdomen is soft.     Tenderness: There is no abdominal tenderness. There is no right CVA tenderness or guarding.  Musculoskeletal:        General: Swelling present.     Comments: Chronic lower extremity swelling Rt worse than the left  Erythema + No warmth  No drainage   Neurological:     Mental Status: He is alert. Mental status is at baseline.     Motor: No weakness.     Labs reviewed: Basic Metabolic Panel: Recent Labs    02/11/23 1155 07/21/23 0854 12/02/23 1447  NA 139 141 138  K 4.5 4.9 4.4  CL 98 99 98  CO2 34* 38* 35*  GLUCOSE 218* 158* 200*  BUN 23 19 24   CREATININE 0.92 0.83 0.90  CALCIUM  9.9 9.8 9.8   Liver Function Tests: No results for input(s): AST, ALT, ALKPHOS, BILITOT, PROT, ALBUMIN in the last 8760 hours. No results for input(s): LIPASE, AMYLASE in the last 8760 hours. No results for input(s): AMMONIA in the last 8760 hours. CBC: Recent Labs    02/03/23 1406 12/02/23 1447  WBC 8.3 8.0  NEUTROABS 6,258 6,048  HGB 16.4 16.4  HCT 48.9 49.8  MCV 98.8 98.8  PLT 214 211   Lipid Panel: Recent Labs    07/21/23 0854  CHOL 235*  HDL 66  LDLCALC  135*  TRIG 198*  CHOLHDL 3.6   TSH: No results for input(s): TSH in the last 8760 hours. A1C: Lab Results  Component Value Date   HGBA1C 8.1 (H) 12/02/2023    Assessment and Plan Assessment & Plan   1. Acute cough (Primary) Lungs clear  Post pharyngeal wall erythema Will send mucinex twice daily - guaiFENesin (MUCINEX) 600 MG 12 hr tablet; Take 2 tablets (1,200 mg total) by mouth 2 (two) times daily.  Dispense: 20 tablet; Refill: 0  2. Seasonal allergies Take flonase, claritin  - fluticasone (FLONASE) 50 MCG/ACT nasal spray; Place 1 spray into both nostrils daily.  Dispense: 16 g; Refill: 1  3. Lymphedema Chronic No signs of infection Cont with lasix  Elevate feet  Use compression stockings  4. Type 2 diabetes mellitus with diabetic neuropathic arthropathy, without long-term current use of insulin (HCC) Lab Results  Component Value Date   HGBA1C 8.1 (H) 12/02/2023   HGBA1C 7.7 (H) 07/21/2023   HGBA1C 7.1 (H) 12/09/2022       Latest Ref Rng & Units 12/02/2023    2:47 PM 07/21/2023    8:54 AM 02/11/2023   11:55 AM  BMP  Glucose 65 - 99 mg/dL 799  841  781   BUN 7 - 25 mg/dL 24  19  23    Creatinine 0.70 - 1.22 mg/dL 9.09  9.16  9.07   BUN/Creat Ratio 6 - 22 (calc) SEE NOTE:  SEE NOTE:  SEE NOTE:   Sodium 135 - 146 mmol/L 138  141  139   Potassium 3.5 - 5.3 mmol/L 4.4  4.9  4.5   Chloride 98 - 110 mmol/L 98  99  98   CO2 20 - 32 mmol/L 35  38  34   Calcium  8.6 - 10.3 mg/dL  9.8  9.8  9.9    Cont with jardiance   5. Primary hypertension At goal Cont with lisinopril   6. Benign prostatic hyperplasia with nocturia Cont with proscar       Bryar Dahms

## 2024-02-23 ENCOUNTER — Ambulatory Visit: Admitting: Sports Medicine

## 2024-02-24 ENCOUNTER — Telehealth: Payer: Self-pay

## 2024-02-24 NOTE — Telephone Encounter (Signed)
 Form received and placed in Dr Theotis folder on her desk

## 2024-02-24 NOTE — Telephone Encounter (Signed)
 Type of forms received: Accessible Collection Service Application  Routed to:  Paperwork received by :  Lonell   Individual made aware of 5-7 business day turn around (Y/N): Y  Form completed and patient made aware of charges(Y/N): Y   Faxed to :   Form location:  [employer] inbox front office Patient care giver to call me back with fax # to [facility], hold form until she calls back - she said it would be tomorrow 12/4 until she can call back with info

## 2024-03-16 ENCOUNTER — Other Ambulatory Visit: Payer: Self-pay | Admitting: Sports Medicine

## 2024-03-16 DIAGNOSIS — I890 Lymphedema, not elsewhere classified: Secondary | ICD-10-CM

## 2024-04-21 ENCOUNTER — Telehealth: Payer: Self-pay

## 2024-04-21 NOTE — Telephone Encounter (Signed)
 Type of forms received: [facility]  Routed to: Team [employer]  Paperwork received by :  fax   Individual made aware of 5-7 business day turn around (Y/N): n/a  Form completed and patient made aware of charges(Y/N): n/a   Faxed to :   Form location:  [employer] inbox front office

## 2024-04-21 NOTE — Telephone Encounter (Signed)
 Form received and placed on pcp desk for signature

## 2024-04-22 NOTE — Telephone Encounter (Signed)
 Form signed and gave to [facility]

## 2024-04-28 ENCOUNTER — Ambulatory Visit: Admitting: Sports Medicine

## 2024-12-19 ENCOUNTER — Ambulatory Visit: Payer: Self-pay | Admitting: Family

## 2024-12-21 ENCOUNTER — Ambulatory Visit
